# Patient Record
Sex: Female | Born: 1965 | Race: White | Hispanic: No
Health system: Midwestern US, Community
[De-identification: ages and names within clinical notes are randomized; demographics above are authoritative.]

## PROBLEM LIST (undated history)

## (undated) DIAGNOSIS — R109 Unspecified abdominal pain: Secondary | ICD-10-CM

## (undated) DIAGNOSIS — Z0000 Encounter for general adult medical examination without abnormal findings: Secondary | ICD-10-CM

---

## 2010-04-04 LAB — BASIC METABOLIC PANEL WITHOUT CALCIUM
BUN, WHOLE BLOOD: 12 mg/dl (ref 7–22)
CO2, WHOLE BLOOD: 22 meq/l — ABNORMAL LOW (ref 23–33)
CREATININE, WHOLE BLOOD: 0.8 mg/dl (ref 0.5–1.2)
Chloride, Whole Blood: 106 meq/l (ref 98–111)
Glucose, Whole Blood: 89 mg/dl (ref 70–108)
Potassium, Whole Blood: 4 meq/l (ref 3.5–5.2)
Sodium, Whole Blood: 140 meq/l (ref 135–145)

## 2010-04-04 LAB — URINALYSIS WITH MICROSCOPIC
Bilirubin, Urine: NEGATIVE
CASTS 2: NONE SEEN /lpf
Casts UA: NONE SEEN /lpf
Crystals, UA: NONE SEEN
Glucose, Ur: NEGATIVE mg/dl
Ketones, Urine: NEGATIVE
Leukocyte Esterase, Urine: NEGATIVE
MISCELLANEOUS 2: NONE SEEN
Nitrite, Urine: NEGATIVE
Protein, UA: NEGATIVE
Renal Epithelial, UA: NONE SEEN
Specific Gravity, Urine: 1.016 (ref 1.002–1.030)
Urobilinogen, Urine: 0.2 eu/dl (ref 0.0–1.0)
pH, UA: 7 (ref 5.0–9.0)

## 2010-04-04 LAB — CBC
Basophils: 0.5 % (ref 0.0–3.0)
Eosinophils: 1.4 % (ref 0.0–4.0)
Hematocrit: 43.2 % (ref 37.0–47.0)
Hemoglobin: 15 gm/dl (ref 12.0–16.0)
Lymphocytes: 25.4 % (ref 15.0–47.0)
MCH: 33.9 pg — ABNORMAL HIGH (ref 27.0–31.0)
MCHC: 34.7 gm/dl (ref 33.0–37.0)
MCV: 97.7 fL (ref 81.0–99.0)
MPV: 9.4 mcm (ref 7.4–10.4)
Monocytes: 5.9 % (ref 0.0–12.0)
Nucleated Red Blood Cells: 0 /100 wbc
Platelets: 164 10*3/uL (ref 130–400)
RBC Morphology: NORMAL
RBC: 4.42 10*6/uL (ref 4.20–5.40)
RDW: 15 % — ABNORMAL HIGH (ref 11.5–14.5)
Seg Neutrophils: 66.8 % (ref 43.0–75.0)
WBC: 10.6 10*3/uL (ref 4.8–10.8)

## 2010-04-04 LAB — C-REACTIVE PROTEIN: CRP: <0.4 mg/dl (ref 0.00–1.00)

## 2010-04-04 LAB — ANION GAP: Anion Gap: 15.4 (ref 10.0–20.0)

## 2010-04-04 LAB — SEDIMENTATION RATE, AUTOMATED: Sed Rate: 2 mm/hr (ref 0–20)

## 2010-04-04 LAB — OSMOLALITY: Osmolality Calc: 279 (ref 275–300)

## 2010-04-06 LAB — URINALYSIS WITH MICROSCOPIC
Bilirubin Urine: NEGATIVE
Casts: NONE SEEN /lpf
Casts: NONE SEEN /lpf
Crystals: NONE SEEN
Glucose: NEGATIVE mg/dl
Ketones, Urine: NEGATIVE
Leukocyte Esterase, Urine: NEGATIVE
Miscellaneous Lab Test Result: NONE SEEN
Nitrite, Urine: NEGATIVE
Protein, UA: NEGATIVE mg/dl
Renal Epithelial, UA: NONE SEEN
Specific Gravity, UA: 1.026 (ref 1.002–1.030)
Urobilinogen, Urine: 1 eu/dl (ref 0.0–1.0)
pH, UA: 6 (ref 5.0–9.0)

## 2010-04-06 LAB — DRUGS OF ABUSE SCR, URINE
AMPHETAMINE/METHAMPH CONCENTRATION: <1 ng/ml
Amphetamine+Methamphetamine Urine Screen: NEGATIVE
Barbiturates: <1 ng/ml
Barbiturates: NEGATIVE
Benzodiazepine Screen, Urine: 612 ng/ml
Benzodiazepine Screen, Urine: POSITIVE
Cannabinoid: <1 ng/ml
Cannabinoids: NEGATIVE
Cocaine (Metabolite): NEGATIVE
Cocaine: <1 ng/ml
Opiates: 117 ng/ml
Opiates: NEGATIVE
Phencyclidine: <1 ng/ml
Phencyclidine: NEGATIVE

## 2010-04-06 LAB — CBC
Basophils: 0.1 % (ref 0.0–3.0)
Eosinophils: 0.1 % (ref 0.0–4.0)
Hematocrit: 42.5 % (ref 37.0–47.0)
Hemoglobin: 14.4 gm/dl (ref 12.0–16.0)
Lymphocytes: 7.2 % — ABNORMAL LOW (ref 15.0–47.0)
MCH: 33.5 pg — ABNORMAL HIGH (ref 27.0–31.0)
MCHC: 33.8 gm/dl (ref 33.0–37.0)
MCV: 99 fL (ref 81.0–99.0)
MPV: 9.6 mcm (ref 7.4–10.4)
Monocytes: 3.5 % (ref 0.0–12.0)
Nucleated Red Blood Cells: 0 /100 wbc
Platelets: 160 10*3/uL (ref 130–400)
RBC Morphology: NORMAL
RBC: 4.29 10*6/uL (ref 4.20–5.40)
RDW: 15.1 % — ABNORMAL HIGH (ref 11.5–14.5)
Seg Neutrophils: 89.1 % — ABNORMAL HIGH (ref 43.0–75.0)
WBC: 12.7 10*3/uL — ABNORMAL HIGH (ref 4.8–10.8)

## 2010-04-06 LAB — HEPATIC FUNCTION PANEL
ALT: 111 U/L — ABNORMAL HIGH (ref 11–66)
AST: 125 U/L — ABNORMAL HIGH (ref 12–45)
Albumin: 4.3 gm/dl (ref 3.5–5.1)
Alkaline Phosphatase: 137 U/L — ABNORMAL HIGH (ref 38–126)
Bilirubin, Direct: 0.1 mg/dl (ref 0.0–0.3)
Bilirubin, Total: 0.2 mg/dl — ABNORMAL LOW (ref 0.3–1.2)
Total Protein: 6.8 gm/dl (ref 6.1–8.0)

## 2010-04-06 LAB — BASIC METABOLIC PANEL WITHOUT CALCIUM
BUN, WHOLE BLOOD: 20 mg/dl (ref 7–22)
CO2, WHOLE BLOOD: 23 meq/l (ref 23–33)
CREATININE, WHOLE BLOOD: 0.8 mg/dl (ref 0.5–1.2)
Chloride, Whole Blood: 105 meq/l (ref 98–111)
Glucose, Whole Blood: 112 mg/dl — ABNORMAL HIGH (ref 70–108)
Potassium, Whole Blood: 4.3 meq/l (ref 3.5–5.2)
Sodium, Whole Blood: 138 meq/l (ref 135–145)

## 2010-04-06 LAB — HCG, SERUM, QUALITATIVE: Preg, Serum: NEGATIVE

## 2010-04-06 LAB — OSMOLALITY: Osmolality Calc: 279 (ref 275–300)

## 2010-04-06 LAB — TROPONIN: Troponin I: <0.006 ng/ml

## 2010-04-06 LAB — ANA SCREEN WITH REFLEX

## 2010-04-06 LAB — TSH WITH REFLEX: TSH: 0.596 mcIU/ml (ref 0.400–4.200)

## 2010-04-06 LAB — CK-MB
CK-MB: 2.6 ng/ml (ref 0.0–6.5)
Relative Index: 0.68 (ref <3.3)

## 2010-04-06 LAB — ANION GAP: Anion Gap: 14 (ref 10.0–20.0)

## 2010-04-06 LAB — D-DIMER, QUANTITATIVE: D-Dimer, Quant: 169 ng/ml FEU (ref 68.00–494.00)

## 2010-04-06 LAB — CK WITH REFLEX CK-MB: Total CK: 381 U/L — ABNORMAL HIGH (ref 30–135)

## 2010-05-22 LAB — BASIC METABOLIC PANEL WITHOUT CALCIUM
BUN: 11 mg/dl (ref 7–22)
CO2: 26 meq/l (ref 23–33)
Chloride: 110 meq/l (ref 98–111)
Creatinine: 0.7 mg/dl (ref 0.4–1.2)
Glucose: 84 mg/dl (ref 70–108)
Potassium: 3.7 meq/l (ref 3.5–5.2)
Sodium: 140 meq/l (ref 135–145)

## 2010-05-22 LAB — CBC
Basophils: 0.5 % (ref 0.0–3.0)
Eosinophils: 3.8 % (ref 0.0–4.0)
Hematocrit: 44.3 % (ref 37.0–47.0)
Hemoglobin: 14.9 gm/dl (ref 12.0–16.0)
Lymphocytes: 35.3 % (ref 15.0–47.0)
MCH: 33 pg — ABNORMAL HIGH (ref 27.0–31.0)
MCHC: 33.7 gm/dl (ref 33.0–37.0)
MCV: 98 fL (ref 81.0–99.0)
MPV: 8.8 mcm (ref 7.4–10.4)
Monocytes: 5.8 % (ref 0.0–12.0)
Nucleated Red Blood Cells: 0 /100 wbc
Platelets: 232 10*3/uL (ref 130–400)
RBC Morphology: NORMAL
RBC: 4.52 10*6/uL (ref 4.20–5.40)
RDW: 14.1 % (ref 11.5–14.5)
Seg Neutrophils: 54.6 % (ref 43.0–75.0)
WBC: 9.1 10*3/uL (ref 4.8–10.8)

## 2010-05-22 LAB — ACETAMINOPHEN LEVEL: Acetaminophen Level: 4.1 ug/mL (ref 0.0–20.0)

## 2010-05-22 LAB — HCG, SERUM, QUALITATIVE: Preg, Serum: NEGATIVE

## 2010-05-22 LAB — ANION GAP: Anion Gap: 7.7 — ABNORMAL LOW (ref 10.0–20.0)

## 2010-05-22 LAB — OSMOLALITY: Osmolality Calc: 278 mOsmol/kg (ref 275.0–300.0)

## 2010-05-22 LAB — SALICYLATE LEVEL: Salicylate, Serum: <1 mg/dl — ABNORMAL LOW (ref 2.0–10.0)

## 2010-05-22 LAB — GLOMERULAR FILTRATION RATE, ESTIMATED: Est, Glom Filt Rate: >90 mL/min/{1.73_m2}

## 2010-05-22 LAB — ETHANOL: ETHYL ALCOHOL, SERUM: <0.01 % (gm/dl)

## 2010-08-10 LAB — BASIC METABOLIC PANEL WITHOUT CALCIUM
BUN, WHOLE BLOOD: 15 mg/dl (ref 7–22)
CO2, WHOLE BLOOD: 30 meq/l (ref 23–33)
CREATININE, WHOLE BLOOD: 0.9 mg/dl (ref 0.5–1.2)
Chloride, Whole Blood: 104 meq/l (ref 98–111)
Glucose, Whole Blood: 110 mg/dl — ABNORMAL HIGH (ref 70–108)
Potassium, Whole Blood: 4.1 meq/l (ref 3.5–5.2)
Sodium, Whole Blood: 141 meq/l (ref 135–145)

## 2010-08-10 LAB — CALCIUM: Calcium: 9.1 mg/dl (ref 8.5–10.5)

## 2010-08-10 LAB — HEPATIC FUNCTION PANEL
ALT: 19 U/L (ref 11–66)
AST: 17 U/L (ref 12–45)
Albumin: 4.4 gm/dl (ref 3.5–5.1)
Alkaline Phosphatase: 122 U/L (ref 38–126)
Bilirubin, Direct: 0.1 mg/dl (ref 0.0–0.3)
Bilirubin, Total: 0.2 mg/dl — ABNORMAL LOW (ref 0.3–1.2)
Total Protein: 7.1 gm/dl (ref 6.1–8.0)

## 2010-08-10 LAB — CBC
Basophils: 0.4 % (ref 0.0–3.0)
Eosinophils: 2.1 % (ref 0.0–4.0)
Hematocrit: 41.8 % (ref 37.0–47.0)
Hemoglobin: 14.9 gm/dl (ref 12.0–16.0)
Lymphocytes: 24.5 % (ref 15.0–47.0)
MCH: 32.9 pg — ABNORMAL HIGH (ref 27.0–31.0)
MCHC: 35.7 gm/dl (ref 33.0–37.0)
MCV: 92.1 fL (ref 81.0–99.0)
MPV: 9.4 mcm (ref 7.4–10.4)
Monocytes: 5.9 % (ref 0.0–12.0)
Platelets: 221 10*3/uL (ref 130–400)
RBC Morphology: NORMAL
RBC: 4.54 10*6/uL (ref 4.20–5.40)
RDW: 13.5 % (ref 11.5–14.5)
Seg Neutrophils: 67.1 % (ref 43.0–75.0)
WBC: 10.1 10*3/uL (ref 4.8–10.8)

## 2010-08-10 LAB — HCG, SERUM, QUALITATIVE: Preg, Serum: NEGATIVE

## 2010-08-10 LAB — PROLACTIN: Prolactin: 8.8 ng/ml

## 2010-08-10 LAB — MAGNESIUM: Magnesium: 2.2 mg/dl (ref 1.6–2.4)

## 2010-08-10 LAB — ANION GAP: Anion Gap: 17 (ref 10.0–20.0)

## 2010-08-10 LAB — OSMOLALITY: Osmolality Calc: 283 (ref 275–300)

## 2010-08-12 ENCOUNTER — Observation Stay: Admit: 2010-08-12 | Disposition: A | Discharge status: Other Acute Inpatient Hospital | Admitting: Internal Medicine

## 2010-08-12 LAB — POCT GLUCOSE
POC Glucose: 132 mg/dl — ABNORMAL HIGH (ref 70–108)
POC Glucose: 105 mg/dl (ref 70–108)

## 2010-08-12 LAB — TSH: TSH: 1.647 mcIU/ml (ref 0.400–4.200)

## 2010-08-12 LAB — CBC
Basophils: 0.4 % (ref 0.0–3.0)
Eosinophils: 4.1 % — ABNORMAL HIGH (ref 0.0–4.0)
Hematocrit: 42.6 % (ref 37.0–47.0)
Hemoglobin: 14.3 gm/dl (ref 12.0–16.0)
Lymphocytes: 37.1 % (ref 15.0–47.0)
MCH: 32.7 pg — ABNORMAL HIGH (ref 27.0–31.0)
MCHC: 33.4 gm/dl (ref 33.0–37.0)
MCV: 97.8 fL (ref 81.0–99.0)
MPV: 9.1 mcm (ref 7.4–10.4)
Monocytes: 8.3 % (ref 0.0–12.0)
Nucleated Red Blood Cells: 0 /100 wbc
Platelets: 210 10*3/uL (ref 130–400)
RBC Morphology: NORMAL
RBC: 4.36 10*6/uL (ref 4.20–5.40)
RDW: 13.8 % (ref 11.5–14.5)
Seg Neutrophils: 50.1 % (ref 43.0–75.0)
WBC: 9.4 10*3/uL (ref 4.8–10.8)

## 2010-08-12 LAB — PHOSPHORUS: Phosphorus: 5.3 mg/dl — ABNORMAL HIGH (ref 2.4–4.7)

## 2010-08-12 LAB — BASIC METABOLIC PANEL WITHOUT CALCIUM
BUN, WHOLE BLOOD: 21 mg/dl (ref 7–22)
CO2, WHOLE BLOOD: 25 meq/l (ref 23–33)
CREATININE, WHOLE BLOOD: 1.1 mg/dl (ref 0.5–1.2)
Chloride, Whole Blood: 107 meq/l (ref 98–111)
Glucose, Whole Blood: 117 mg/dl — ABNORMAL HIGH (ref 70–108)
Potassium, Whole Blood: 4.8 meq/l (ref 3.5–5.2)
Sodium, Whole Blood: 135 meq/l (ref 135–145)

## 2010-08-12 LAB — PROLACTIN: Prolactin: 31.1 ng/ml

## 2010-08-12 LAB — ANION GAP: Anion Gap: 11.2 (ref 10.0–20.0)

## 2010-08-12 LAB — MAGNESIUM: Magnesium: 2.4 mg/dl (ref 1.6–2.4)

## 2010-08-12 LAB — OSMOLALITY: Osmolality Calc: 275 (ref 275–300)

## 2010-08-12 LAB — CALCIUM: Calcium: 9.1 mg/dl (ref 8.5–10.5)

## 2011-01-08 LAB — HEPATIC FUNCTION PANEL
ALT: 30 U/L (ref 11–66)
AST: 18 U/L (ref 12–45)
Albumin: 4.5 gm/dl (ref 3.5–5.1)
Alkaline Phosphatase: 133 U/L — ABNORMAL HIGH (ref 38–126)
Bilirubin, Direct: 0.1 mg/dl (ref 0.0–0.3)
Bilirubin, Total: 0.3 mg/dl (ref 0.3–1.2)
Total Protein: 7 gm/dl (ref 6.1–8.0)

## 2011-01-08 LAB — BASIC METABOLIC PANEL WITHOUT CALCIUM
BUN, WHOLE BLOOD: 10 mg/dl (ref 7–22)
CO2, WHOLE BLOOD: 22 meq/l — ABNORMAL LOW (ref 23–33)
CREATININE, WHOLE BLOOD: 0.7 mg/dl (ref 0.5–1.2)
Chloride, Whole Blood: 107 meq/l (ref 98–111)
Glucose, Whole Blood: 101 mg/dl (ref 70–108)
Potassium, Whole Blood: 5.5 meq/l — ABNORMAL HIGH (ref 3.5–5.2)
Sodium, Whole Blood: 137 meq/l (ref 135–145)

## 2011-01-08 LAB — LIPASE: Lipase: 20 U/L (ref 5.6–51.3)

## 2011-01-08 LAB — CBC WITH AUTO DIFFERENTIAL
Basophils: 0.4 % (ref 0.0–3.0)
Eosinophils: 1.7 % (ref 0.0–4.0)
Hematocrit: 41.6 % (ref 37.0–47.0)
Hemoglobin: 13.9 gm/dl (ref 12.0–16.0)
Lymphocytes: 38.7 % (ref 15.0–47.0)
MCH: 31.6 pg — ABNORMAL HIGH (ref 27.0–31.0)
MCHC: 33.3 gm/dl (ref 33.0–37.0)
MCV: 94.8 fL (ref 81.0–99.0)
MPV: 8.8 mcm (ref 7.4–10.4)
Monocytes: 6.2 % (ref 0.0–12.0)
Nucleated Red Blood Cells: 0 /100 wbc
Platelets: 218 10*3/uL (ref 130–400)
RBC Morphology: NORMAL
RBC: 4.39 10*6/uL (ref 4.20–5.40)
RDW: 15.5 % — ABNORMAL HIGH (ref 11.5–14.5)
Seg Neutrophils: 53 % (ref 43.0–75.0)
WBC: 8.7 10*3/uL (ref 4.8–10.8)

## 2011-01-08 LAB — PHENYTOIN LEVEL, TOTAL: Phenytoin Lvl: 3.6 ug/mL — ABNORMAL LOW (ref 6.0–18.0)

## 2011-01-08 LAB — ANION GAP: Anion Gap: 16.5 (ref 10.0–20.0)

## 2011-01-08 LAB — OSMOLALITY: Osmolality Calc: 274 — ABNORMAL LOW (ref 275–300)

## 2011-01-11 LAB — POCT BLOOD GASES
Base Excess (Calculated): 1 mmol/l (ref <=2.5)
COLLECTED BY:: 18519
HCO3: 26 mmol/l (ref 23–28)
LITERS PER MINUTE: 2
O2 Sat: 98 % — ABNORMAL HIGH (ref 94–97)
PCO2: 42 mmhg (ref 35–45)
pH: 7.4 (ref 7.35–7.45)
pO2: 109 mmhg — ABNORMAL HIGH (ref 71–104)

## 2011-01-11 LAB — DRUGS OF ABUSE SCR, URINE
AMPHETAMINE/METHAMPH CONCENTRATION: 45 ng/ml
Amphetamine+Methamphetamine Urine Screen: NEGATIVE
Barbiturates: 68 ng/ml
Barbiturates: NEGATIVE
Benzodiazepine Screen, Urine: 700 ng/ml
Benzodiazepine Screen, Urine: POSITIVE
Cannabinoid: <1 ng/ml
Cannabinoids: NEGATIVE
Cocaine (Metabolite): NEGATIVE
Cocaine: <1 ng/ml
Opiates: 44 ng/ml
Opiates: NEGATIVE
Phencyclidine: 8 ng/ml
Phencyclidine: NEGATIVE

## 2011-01-11 LAB — URINALYSIS WITH MICROSCOPIC
Bilirubin, Urine: NEGATIVE
CASTS 2: NONE SEEN /lpf
Casts UA: NONE SEEN /lpf
Crystals, UA: NONE SEEN
Glucose, Ur: NEGATIVE mg/dl
Ketones, Urine: NEGATIVE
Leukocyte Esterase, Urine: NEGATIVE
MISCELLANEOUS 2: NONE SEEN
Nitrite, Urine: NEGATIVE
Protein, UA: NEGATIVE
Renal Epithelial, UA: NONE SEEN
Specific Gravity, Urine: 1.023 (ref 1.002–1.030)
Urobilinogen, Urine: 0.2 eu/dl (ref 0.0–1.0)
Yeast, UA: NONE SEEN
pH, UA: 6 (ref 5.0–9.0)

## 2011-01-11 LAB — CBC WITH AUTO DIFFERENTIAL
Basophils: 0.6 % (ref 0.0–3.0)
Eosinophils: 2.6 % (ref 0.0–4.0)
Hematocrit: 43.8 % (ref 37.0–47.0)
Hemoglobin: 14.9 gm/dl (ref 12.0–16.0)
Lymphocytes: 44.5 % (ref 15.0–47.0)
MCH: 31.5 pg — ABNORMAL HIGH (ref 27.0–31.0)
MCHC: 34.1 gm/dl (ref 33.0–37.0)
MCV: 92.4 fL (ref 81.0–99.0)
MPV: 9.3 mcm (ref 7.4–10.4)
Monocytes: 6.9 % (ref 0.0–12.0)
Nucleated Red Blood Cells: 0 /100 wbc
Platelets: 223 10*3/uL (ref 130–400)
RBC Morphology: NORMAL
RBC: 4.74 10*6/uL (ref 4.20–5.40)
RDW: 15.3 % — ABNORMAL HIGH (ref 11.5–14.5)
Seg Neutrophils: 45.4 % (ref 43.0–75.0)
WBC: 9.2 10*3/uL (ref 4.8–10.8)

## 2011-01-11 LAB — OSMOLALITY: Osmolality Calc: 283 (ref 275–300)

## 2011-01-11 LAB — BASIC METABOLIC PANEL WITHOUT CALCIUM
BUN, WHOLE BLOOD: 10 mg/dl (ref 7–22)
CO2, WHOLE BLOOD: 29 meq/l (ref 23–33)
CREATININE, WHOLE BLOOD: 0.7 mg/dl (ref 0.5–1.2)
Chloride, Whole Blood: 107 meq/l (ref 98–111)
Glucose, Whole Blood: 116 mg/dl — ABNORMAL HIGH (ref 70–108)
Potassium, Whole Blood: 4.4 meq/l (ref 3.5–5.2)
Sodium, Whole Blood: 142 meq/l (ref 135–145)

## 2011-01-11 LAB — ETHANOL: ETHYL ALCOHOL, SERUM: <0.01 % (gm/dl)

## 2011-01-11 LAB — ANION GAP: Anion Gap: 14.2 (ref 10.0–20.0)

## 2011-01-11 LAB — TROPONIN: Troponin I: <0.006 ng/ml

## 2011-01-11 LAB — PHENYTOIN LEVEL, TOTAL: Phenytoin Lvl: 3 ug/mL — ABNORMAL LOW (ref 6.0–18.0)

## 2011-01-11 LAB — CK WITH REFLEX CK-MB: Total CK: 82 U/L (ref 30–135)

## 2011-01-18 LAB — BASIC METABOLIC PANEL WITHOUT CALCIUM
BUN: 14 mg/dl (ref 7–22)
CO2: 30 meq/l (ref 23–33)
Chloride: 109 meq/l (ref 98–111)
Creatinine: 0.8 mg/dl (ref 0.4–1.2)
Glucose: 108 mg/dl (ref 70–108)
Potassium: 3.9 meq/l (ref 3.5–5.2)
Sodium: 142 meq/l (ref 135–145)

## 2011-01-18 LAB — CBC WITH AUTO DIFFERENTIAL
Basophils: 0.9 % (ref 0.0–3.0)
Eosinophils: 2.3 % (ref 0.0–4.0)
Hematocrit: 41 % (ref 37.0–47.0)
Hemoglobin: 13.9 gm/dl (ref 12.0–16.0)
Lymphocytes: 41.8 % (ref 15.0–47.0)
MCH: 31.5 pg — ABNORMAL HIGH (ref 27.0–31.0)
MCHC: 34 gm/dl (ref 33.0–37.0)
MCV: 92.9 fL (ref 81.0–99.0)
MPV: 9.8 mcm (ref 7.4–10.4)
Monocytes: 7.2 % (ref 0.0–12.0)
Nucleated Red Blood Cells: 0 /100 wbc
Platelet Estimate: ADEQUATE
Platelets: 199 10*3/uL (ref 130–400)
RBC Morphology: ABNORMAL
RBC: 4.42 10*6/uL (ref 4.20–5.40)
RDW: 14.5 % (ref 11.5–14.5)
Seg Neutrophils: 47.8 % (ref 43.0–75.0)
WBC: 8.2 10*3/uL (ref 4.8–10.8)

## 2011-01-18 LAB — HCG, SERUM, QUALITATIVE: Preg, Serum: NEGATIVE

## 2011-01-18 LAB — HEPATIC FUNCTION PANEL
ALT: 24 U/L (ref 11–66)
AST: 21 U/L (ref 12–45)
Albumin: 4.3 gm/dl (ref 3.5–5.1)
Alkaline Phosphatase: 132 U/L — ABNORMAL HIGH (ref 38–126)
Bilirubin, Direct: 0 mg/dl (ref 0.0–0.3)
Bilirubin, Total: 0.2 mg/dl — ABNORMAL LOW (ref 0.3–1.2)
Total Protein: 6.8 gm/dl (ref 6.1–8.0)

## 2011-01-18 LAB — ANION GAP: Anion Gap: 6.9 — ABNORMAL LOW (ref 10.0–20.0)

## 2011-01-18 LAB — OSMOLALITY: Osmolality Calc: 284.1 mOsmol/kg (ref 275.0–300.0)

## 2011-01-18 LAB — PHENYTOIN LEVEL, TOTAL: Phenytoin Lvl: 3.3 ug/mL — ABNORMAL LOW (ref 6.0–18.0)

## 2011-01-18 LAB — LIPASE: Lipase: 26 U/L (ref 5.6–51.3)

## 2011-01-18 LAB — GLOMERULAR FILTRATION RATE, ESTIMATED: Est, Glom Filt Rate: 77 mL/min/{1.73_m2} — AB

## 2011-01-18 LAB — SCAN OF BLOOD SMEAR

## 2011-01-19 ENCOUNTER — Inpatient Hospital Stay: Admit: 2011-01-19 | Disposition: A | Discharge status: Home / Self Care | Admitting: Anesthesiology

## 2011-01-19 LAB — PHENYTOIN LEVEL, TOTAL: Phenytoin Lvl: 11.6 ug/mL (ref 6.0–18.0)

## 2011-01-20 NOTE — Procedures (Unsigned)
[facility]                         ELECTROENCEPHALOGRAPHY LAB (EEG)                              [address]                                 [place], [place] [address]                                  [phone]                                       EEG REPORT     PATIENT NAME: Theresa Hobbs, Theresa Hobbs.  DATE: 01/20/2011  MEDICAL RECORD NO: [id number]  ROOM NO: 4A 0006  ACCOUNT NO: [id number]  REFERRING DOCTOR: Lenon Ahmadi, M.D.        HISTORY:  A 45 year old female who had a seizure on 01/19/2011.  He had  stiffness, brief unresponsiveness, staring, headaches.  Has history of MS,  lupus, pseudoseizures, depression.  On Dilantin, Keppra, Valium, Lovenox,  Neurontin, Dilaudid, Phenergan, Protonix, prednisone.     REPORT:  A 16-channel digital EEG, data recorded using the 10/20 system.  Channels also dedicated to EKG and ocular movement.  The background occipital  alpha activity is 8-10 Hz in frequency and 40-60 mcV in amplitude.  Organization is fair.  Superimposed muscle artifact as well as beta activity  throughout the recording seen.  There is normal attenuation to eye opening.  The patient because drowsy but did not enter into Stage II of sleep.  There  was minimal driving to photic stimulation.  No epileptiform discharged  identified.     IMPRESSION:  This is a borderline normal EEG awake and drowsy.  No seizure  activity identified.  Medication effect reflected as beta activity seen.           Jilian West A. Weyman Rodney, M.D.        D: 01/20/2011 08:55                                    T: 01/20/2011 10:08  ks     CC:  Lenon Ahmadi, M.D.               Darneisha Windhorst A. Izick Gasbarro, M.D.  [address]                      [facility] [address]                             [address]  [place], [place] [address]                        [place], [place] [address]

## 2011-01-20 NOTE — Consults (Unsigned)
[facility]                                    [place], [place]                                      CONSULTATION     TO:                                     RE: Theresa, Hobbs                                          MED REC NO:         ROOM: 4A 0006                                          [id number]  FROM: Gershon Cull. Shannia Jacuinde, M.D.             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  She has a long history of pseudoseizures as well as migraine  headaches.  She takes Dilantin and Keppra.  She was vomiting recently and did  not take her medications so she presented with seizure like activities,  starring, spaciness, poor responsiveness.  She had several attacks and on her  way to the ER they describe what they call as generalized seizure.  Her  phenytoin level was 3.3.  She was loaded with Dilantin and admitted.  She had  previous encounters with Dr. Valentina Shaggy and Dr. Delphina Cahill in neurology and she  does not want to see them both, so I elected to see the patient.  CBC within  normal limits.  Chemistry profile normal.  Bilirubin normal.  She had an EEG  that I reviewed, it is within normal limits.  Blood pressure within normal.  Previous CT within normal limits.     REVIEW OF SYSTEMS:  Remarkable for seizure like activity, dizziness,  headaches, depression.     PAST MEDICAL HISTORY:  Remarkable for presumed MS, lupus, renal cancer with  radiation, EGD, peptic ulcer disease, pseudoseizures, drug seeking behavior,  suicidal ideation, depression.     PAST SURGICAL HISTORY:  Cholecystectomy, nasal polyp removal.     CURRENT MEDICATIONS:  Dilantin 100 mg p.o. three times a day, Keppra 500  twice a day, Percocet, Fioricet, Valium 5 mg four times a day,  Lidoderm,  Protonix, prednisone 20 mg daily, Phenergan, Carafate, Restoril 30 mg at  night.     ALLERGIES:  TORADOL, SULFA,  PENICILLIN, NUBAIN, LEVAQUIN, KLONOPIN, FLAGYL,  ERYTHROMYCIN, CIPRO, CEFTIN, BACTRIM, AUGMENTIN.     FAMILY HISTORY:  Noncontributory.     PHYSICAL EXAMINATION:  The patient was dazed and in and out of conversation.  She would stare and then respond but these were not typical for complex  partial seizure.  It was more of an odd behavior.  Pupils 4 mm, reactive to  light.  Visual fields full.  No facial asymmetry or weakness, no facial  sensory loss.  Tongue protruded symmetrically in the midline.  Palate  elevated symmetrically in the midline.  She did not cooperative sometimes  with motor evaluation.  Reflexes 1/2 throughout.  No Babinski sign, no  clonus, no spasticity.  GAIT:  Not tested.  HEART:  Regular rate and rhythm.     IMPRESSION:  Theresa Hobbs presents with recurrent seizure like activity with normal EEG  and odd behavior consistent, mimicking complex partial seizures.  The patient  has pseudoseizures, these are obviously nonepileptic.  We will leave her on  the same medications for now.  We will do a stat EEG while she is having this  odd behavior just to make sure there is no organic etiology for her symptoms.  Otherwise neurologically stable.        Melainie Krinsky A. Jobie Popp, M.D.        D: 01/20/2011 12:39                                    T: 01/20/2011 14:56  ds

## 2011-01-21 NOTE — Discharge Summary (Unsigned)
[facility]                                    [place], [place]                                    CLINICAL SUMMARY     PATIENT NAME: Theresa Hobbs, Theresa Hobbs           DOB:            01/11/1966  MEDICAL RECORD NO: [id number]            ADMISSION DATE: 01/19/2011  ACCOUNT NO: [id number]                     DISCHARGE DATE: 01/21/2011  PHYSICIAN: Marcellina Millin, M.D.        HOSPITAL COURSE:  A 45 year old white American female with a past medical  history of pseudoseizure, as well as migraine headache.  The patient is  taking Dilantin and Keppra to control her seizures.  Per patient report, she  had a recurrent episode of migraine associated with nausea and vomiting.  The  patient was not able to tolerate her seizure medication.  She presented to  the hospital with seizure-like activity, staring, spaciness, vocal  responsiveness.  The patient reports she had different attack on her way to  the Emergency Room.  Per the patient's description has generalized seizure.  The patient also on admission she was complaining of recurrent episodes of  diarrhea, nausea and vomiting.  The patient stated that she noticed some dark  stool.  On further questioning of the patient, she stated that maybe once a  month she admits to some dark stool.  She had a regular followup with her  gastroenterologist, Dr. Jarold Motto.  She had EGD done last month with findings  of small nonbleeding gastric ulcer per patient report.  The patient stated  that she is talking Protonix regularly.  At the beginning on admission, there  was a report that the patient noticed some fresh blood, but on further  questioning the patient, she stated she had black stool and usually she has  this hematemesis like almost once a month.  We admitted the patient to the  hospital for further evaluation of her condition.     Workup during hospitalization, phenytoin level was 3.3, hemoglobin was 13.9,  hematocrit was 41.0, and WBC was 8.2, platelets  199,000.  Sodium 142,  potassium 3.9, chloride 109, bicarbonate 30, glucose 108, BUN 14, creatinine  0.8.  Total protein was 6.8.  Anion gap was 6.9.  Albumin was 4.3, AST was  21, ALT was 24, alkaline phosphatase was 132, total bilirubin was 0.2, direct  bilirubin was 0.0, lipase was 26.  Pregnancy test was negative.  We admitted  the patient to the hospital for further evaluation.        We started the patient on IV Protonix, in addition added Zofran as needed for  nausea.  Pain control started with Dilaudid.  EEG was ordered and was done  and showed borderline normal EEG, awake and drowsy.  No seizure activity  identified.  Medication effect reflected as beta activity is seen.  We  consulted Dr. Ronna Polio who came and evaluated the patient.  He stated  that the patient had pseudoseizure.  With  her negative EEG, his clinical  evaluation for the patient is stated that this is obviously not epileptic.  He states to continue the same medication.  During the hospital stay, the  patient does not have any witnessed active seizure.  Dr. Weyman Rodney recommended  to have MRI to be done.  The patient refused MRI to be done.  The patient was  complaining of neck pain and shoulder pain status post fall.  X-ray ordered,  but the patient refused to have x-ray to be done for the neck and lumbar  spine, but x-ray for the shoulder was done which showed no acute fracture or  dislocation.  We started the patient on Fioricet for her migraine headache.  I had a long discussion with the patient about her peptic ulcer disease and  her compliance with medication.  The report on admission stated noncompliance  to medication, but on further questioning, the patient stated she is taking  her medications regularly.  I counseled her about taking her medications  regularly.  Need to have reevaluation by GI in 1 week or earlier if she has  any further melena in stool.  I discussed with the patient about the need to  follow up with neurologist in  1-2 weeks for monitoring her symptoms.  Dr.  Weyman Rodney is okay to discharge the patient home.  On the day of discharge, the  patient denies any nausea or vomiting, denies any chest pain.  No trouble  breathing.  No seizure.  No motor or sensory changes.  She has some mild  headache, relieved after taking Percocet.  The patient denies any abdominal  pain, denies any leg pain.     PHYSICAL EXAMINATION:  GENERAL:  Alert, oriented in no distress.  CHEST:  Clear to auscultation bilaterally.  HEART:  S1-S2 normal, regular rate and  rhythm.  No  murmur, no rub, no gallop.  ABDOMEN:  Soft, nontender,  nondistended.  EXTREMITIES:  No edema.  Motor 5/5 in all extremities.  Sensory intact.  Cranial nerves II-XII were within normal limits.     MEDICATIONS ON DISCHARGE:  Neurontin 200 mg b.i.d., diazepam 1 tablet q.6h.,  Effexor 150 mg once daily, pantoprazole 40 mg once daily, temazepam 2 caps at  bedtime, prednisone 20 mg once daily, phenytoin 300 mg t.i.d., Keppra 500 mg  t.i.d., Fioricet 1 tablet q.4h. as needed, Carafate 1 g t.i.d. x5 days before  meals, Flexeril 5 mg q.6h. as needed, calcium with vitamin D 600/400 mg oral  b.i.d., vitamin D 1000 units once daily, acetaminophen hydrocodone 5/500  q.8h. p.r.n., #20, no refill, Phenergan 12.5 mg q.8h. as needed.     Follow up with neurologist in 1 week.  Follow up with gastroenterologist in 1  week.  Follow up with family physician in 1 week.     I had a long discussion with the patient, counseling about narcotics and  about side effects.     DISCHARGE DIAGNOSIS:       1.    Pseudoseizure.       2.    Low back pain.       3.    Major depression.       4.    Gastritis.           Marcellina Millin, M.D.        D: 01/21/2011 16:56  T: 01/23/2011 12:08  als     CC:  Marcellina Millin, M.D.            Hospitalist Program  [address]  [address]  [place], [place] [address]

## 2011-01-23 LAB — CBC WITH AUTO DIFFERENTIAL
Basophils: 0.4 % (ref 0.0–3.0)
Eosinophils: 3.4 % (ref 0.0–4.0)
Hematocrit: 41.2 % (ref 37.0–47.0)
Hemoglobin: 13.8 gm/dl (ref 12.0–16.0)
Lymphocytes: 49 % — ABNORMAL HIGH (ref 15.0–47.0)
MCH: 31.4 pg — ABNORMAL HIGH (ref 27.0–31.0)
MCHC: 33.4 gm/dl (ref 33.0–37.0)
MCV: 93.8 fL (ref 81.0–99.0)
MPV: 8.9 mcm (ref 7.4–10.4)
Monocytes: 6.6 % (ref 0.0–12.0)
Nucleated Red Blood Cells: 0 /100 wbc
Platelets: 204 10*3/uL (ref 130–400)
RBC Morphology: NORMAL
RBC: 4.4 10*6/uL (ref 4.20–5.40)
RDW: 15.3 % — ABNORMAL HIGH (ref 11.5–14.5)
Seg Neutrophils: 40.6 % — ABNORMAL LOW (ref 43.0–75.0)
WBC: 7.3 10*3/uL (ref 4.8–10.8)

## 2011-01-23 LAB — HEPATIC FUNCTION PANEL
ALT: 27 U/L (ref 11–66)
AST: 18 U/L (ref 12–45)
Albumin: 4.1 gm/dl (ref 3.5–5.1)
Alkaline Phosphatase: 133 U/L — ABNORMAL HIGH (ref 38–126)
Bilirubin, Direct: 0 mg/dl (ref 0.0–0.3)
Bilirubin, Total: 0.1 mg/dl — ABNORMAL LOW (ref 0.3–1.2)
Total Protein: 6.3 gm/dl (ref 6.1–8.0)

## 2011-01-23 LAB — BASIC METABOLIC PANEL WITHOUT CALCIUM
BUN: 15 mg/dl (ref 7–22)
CO2: 28 meq/l (ref 23–33)
Chloride: 110 meq/l (ref 98–111)
Creatinine: 0.8 mg/dl (ref 0.4–1.2)
Glucose: 146 mg/dl — ABNORMAL HIGH (ref 70–108)
Potassium: 4.3 meq/l (ref 3.5–5.2)
Sodium: 142 meq/l (ref 135–145)

## 2011-01-23 LAB — GLOMERULAR FILTRATION RATE, ESTIMATED: Est, Glom Filt Rate: 77 mL/min/{1.73_m2} — AB

## 2011-01-23 LAB — OSMOLALITY: Osmolality Calc: 286.6 mOsmol/kg (ref 275.0–300.0)

## 2011-01-23 LAB — PHENYTOIN LEVEL, TOTAL: Phenytoin Lvl: 1.5 ug/mL — ABNORMAL LOW (ref 6.0–18.0)

## 2011-01-23 LAB — HCG, SERUM, QUALITATIVE: Preg, Serum: NEGATIVE

## 2011-01-23 LAB — ANION GAP: Anion Gap: 8.3 — ABNORMAL LOW (ref 10.0–20.0)

## 2011-01-23 NOTE — ED Provider Notes (Unsigned)
[facility]                                    [place], [facility]                        EMERGENCY DEPARTMENT PHYSICIAN DICTATION     PATIENT NAME: Theresa Hobbs, Theresa Hobbs                DOB:  [birth year]  MEDICAL RECORD NO: [id number]                 ROOM: ED  ACCOUNT NO: [id number]                          DATE: 01/22/2011  PHYSICIAN: Quintella Reichert, M.D.     This dictation is an adjunct to the Emergency Department Physician  Documentation Form.     CHIEF COMPLAINT:  45 year old white female brought in here because of  seizures today.     HISTORY OF PRESENT ILLNESS:  The patient is a 45 year old white female who  came in here, walking in. The patient states that she was discharged  yesterday from the hospital after being hospitalized from 01/19/2011 to  01/21/2011. The patient was then admitted to the Hospitalist Service and also  with Dr. Weyman Rodney, the neurologist.  During the hospitalization, the patient  had been complaining of seizures which was characterized as shaking of the  head and the patient stated that she cannot remember or recollect about any  loss of consciousness.  The patient had an EEG during the hospital stay that  was normal and was told that the patient has pseudoseizures.     I talked briefly also with Dr. Weyman Rodney tonight and he indicated that the  patient is wanting mostly narcotics to control her headache and most  medications never work for her in the hospital.  The patient states when she  went home, she was doing well.  However, today upon waking up, she feels  nauseous and vomited four times. She states that she had seizures twice  characterized as shaking of the head left and right without any remembering  things.  However, she did not have any loss of consciousness.  The patient  also has been complaining of a headache and also abdominal pain. She had some  ulcers. She states she vomited four times today. The patient admits that she  is taking all of her  medications.     REVIEW OF SYSTEMS:  Including nurses' notes were reviewed.  She did not have  any fever or chills. No double vision or blurred vision.  No sore throat,  cough, or cold.  No melena or hematochezia.     PERTINENT PHYSICAL EXAM FINDINGS:  VITAL SIGNS:  Temperature 96.1,  respirations 26, pulse 100, blood pressure 90/45, saturation 99%.  GENERAL  APPEARANCE:  Well developed, alert, active and cooperative.  The patient is  not ill looking and conversant. The patient is more argumentative rather than  in pain. The patient has been complaining of being nauseous.  HEAD:  Atraumatic.  EYES:  Pupils are equal, round, and reactive to light.  Extraocular muscles are intact.  EARS:  Tympanic membranes are normal. NOSE:  No nasal discharge.  No tenderness in the sinuses. ORAL MUCOSA:  No  tonsillopharyngeal congestion.  NECK:  Supple. LUNGS:  Good air exchange.  No  rales or wheezes.  HEART:  Regular rate and rhythm. ABDOMEN:  Globular, soft,  depressible.  There is tenderness on the left upper quadrant.  There is no  rebound tenderness.  There is no Obturator or Psoas sign.  No McBurney's  point.  No Murphy's sign.  NEURO EXAMINATION:  Motor, sensory, and cranial  nerves are all intact.  BACK: Showed no CVA tenderness.     MEDICAL DECISION MAKING:       1.    Headache.       2.    Abdominal pain.       3.    History of seizures, however seems like the patient has             pseudoseizures.     DIAGNOSTIC DATA ORDERED:  CBC showed a WBC of 7.3, hemoglobin 13.8,  hematocrit 41, platelet 204. Differential count:  Segs 40.6, lymphocytes 49.  BMP is within normal limits.  The patient's liver function tests are likewise  normal.  Pregnancy test is negative.  Phenytoin level is 1.5.     EMERGENCY DEPARTMENT COURSE:  The patient was examined. The patient is more  argumentative here rather than having active real pain. The patient is having  one supposedly seizure that was I witnessed in front while out of bed.   The  patient's seizures are not true seizures.  She had head shaking with rocking  her head back and forth without any eyes rolling back or any loss of  consciousness nor shaking of the arms and legs. The patient did not have any  loss of consciousness at this time.  I believe the patient's seizure is more  like a pseudoseizure. The patient was given Ativan initially in the triage  area. When I saw the patient, the patient was given 1 mg of Dilaudid followed  by another milligram. She was given also Zofran ODT. The patient's claims  that the Zofran is working better than the Phenergan that she has at home.  The patient's case was discussed with Dr. Weyman Rodney who had known the patient  during the previous hospitalization and indicated that he would like to see  the patient in his office or she needs to follow with [facility]. I  explained to the patient that she needs to see only one physician and one  neurologist so they can monitor rather than jumping from one physician to  another which the patient acknowledged and sent home in stable condition.     CRITICAL CARE:  None.     CONSULTATIONS:  Dr. Weyman Rodney.     PROCEDURES:  None.     DIAGNOSES:       1.    Headache.       2.    Abdominal pain, improved.       3.    Seizures versus pseudoseizures.     PLAN:  DISPOSITION/TREATMENT/INSTRUCTIONS/FOLLOWUP:       1.    Zofran 4 mg #10.       2.    Vicodin #15.       3.    Follow up at [facility] or family doctor this week.       4.    The patient is continue medication at home.       5.    Return if anymore signs or symptoms.     A face to face evaluation and direct participation in all aspects of patient  care was performed by the emergency physician signing this dictation.        Quintella Reichert, M.D.     D: 01/23/2011 01:41                                    T: 01/26/2011 14:04  cd

## 2011-03-31 NOTE — ED Notes (Signed)
Family brought family to ER for altered mental status ongoing "all week". States she was "only orient to her name". Currently undergoing chemo treatment for kidney cancer. Hx of seizures. Pt on way to ER and began seizure like activity in car. Pt to ED room 5 by wheelchair for seizure like activity. Pt nonresponsive to ammonia inhalant. Pt with ability to sit up in bed without comprehensible speech. Seizure pads placed. Pt with voluntary arm movements. Family with patient. Monitor.    Harlon Flor, RN  03/31/11 2219

## 2011-03-31 NOTE — ED Notes (Signed)
Pt goes in and out of speaking jibberish and english, then pt began speaking what family states is another language.    Nanetta Batty, RN  03/31/11 [phone]

## 2011-03-31 NOTE — ED Notes (Signed)
After speaking with jeremy pt is now a&o speaking with family    Shanisha Lech, RN  03/31/11 2357

## 2011-03-31 NOTE — ED Notes (Signed)
Pt moved to room 4. Pt aggitated and yelling out.    Nanetta Batty, RN  03/31/11 2232

## 2011-03-31 NOTE — ED Provider Notes (Signed)
[facility]  eMERGENCY dEPARTMENT eNCOUnter          CHIEF COMPLAINT       Chief Complaint   Patient presents with   . Seizures   . Altered Mental Status       Nurses Notes reviewed and I agree except as noted in the HPI.      HISTORY OF PRESENT ILLNESS    Theresa Hobbs is a 45 y.o. female who presents brought in by family by car. Apparently had been seen 3 times in the last week.  Once at [facility] and twice at [facility].  Was given Dilantin and sent home.  The family members with the patient are concerned because the patient has been confused this week and states has a history of renal cell cancer.  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 Weyman Rodney from Neurology.        REVIEW OF SYSTEMS     Review of Systems   Constitutional: Negative for fever, chills and activity change.   HENT: Negative for ear pain, congestion, sore throat and rhinorrhea.    Eyes: Negative for pain, discharge and itching.   Respiratory: Negative for cough, shortness of breath and wheezing.    Cardiovascular: Negative for chest pain.   Gastrointestinal: Negative for nausea, vomiting, abdominal pain and diarrhea.   Genitourinary: Negative for dysuria and difficulty urinating.   Musculoskeletal: Negative for myalgias, back pain and arthralgias.   Skin: Negative for color change and rash.   Neurological: Negative for dizziness, seizures, light-headedness and headaches.        Convulsions   Psychiatric/Behavioral: Negative for suicidal ideas, confusion, self-injury and agitation.   All other systems reviewed and are negative.           PAST MEDICAL HISTORY    has a past medical history of Cancer.    SURGICAL HISTORY      has no past surgical history on file.    CURRENT MEDICATIONS       Previous Medications    BUTALBITAL-ACETAMINOPHEN-CAFFEINE (FIORICET, ESGIC) PER TABLET    Take 1 tablet by mouth every 4 hours as needed.      DIAZEPAM (VALIUM) 5 MG TABLET    Take 5 mg by mouth every 6 hours as needed.       ONDANSETRON (ZOFRAN-ODT) 4 MG DISINTEGRATING TABLET    Take 4 mg by mouth every 8 hours as needed.      PHENYTOIN (DILANTIN) 100 MG ER CAPSULE    Take  by mouth 2 times daily.      TEMAZEPAM (RESTORIL) 30 MG CAPSULE    Take 30 mg by mouth nightly as needed.      VENLAFAXINE (EFFEXOR-XR) 150 MG XR CAPSULE    Take 150 mg by mouth daily.         ALLERGIES     is allergic to augmentin; azithromycin; bactrim; ceftin; ciprofloxacin; diazepam; erythromycin; flagyl; iv dye; klonopin; levaquin; nubain; pcn; sulfa drugs; temazepam; toradol; and vicodin.    FAMILY HISTORY     has no family status information on file.  family history is not on file.    SOCIAL HISTORY      does not have a smoking history on file. She does not have any smokeless tobacco history on file.    PHYSICAL EXAM     INITIAL VITALS:  height is 5\' 2"  (1.575 m) and weight is 200 lb (90.719 kg). Her axillary temperature is  98.7 F (37.1 C). Her blood pressure is 131/69 and her pulse is 87. Her respiration is 13 and oxygen saturation is 98%.    Physical Exam   Nursing note and vitals reviewed.  Constitutional: She is oriented to person, place, and time. She appears well-developed and well-nourished.   HENT:   Head: Normocephalic and atraumatic.   Eyes: Pupils are equal, round, and reactive to light.   Neck: Normal range of motion. Neck supple.   Cardiovascular: Normal rate.    Pulmonary/Chest: Effort normal. No respiratory distress.   Abdominal: Soft. She exhibits no distension.   Musculoskeletal: Normal range of motion.   Neurological: She is alert and oriented to person, place, and time.        The patient has no deficits.  The patient does talk periodically.  The patient when was in a convulsing episode withdrew from an ammonia tablet and stopped when the nurse told her to stop   Skin: Skin is warm and dry.   Psychiatric: She has a normal mood and affect. Her behavior is normal.         DIFFERENTIAL DIAGNOSIS:   Altered mental Status      DIAGNOSTIC  RESULTS     EKG: All EKG's are interpreted by the Emergency Department Physician who either signs or Co-signs this chart in the absence of a cardiologist.  ----------Electrocardiogram----------  Heart Rate: Normal  RATE: 88  RHYTHM: Normal Sinus Rhythm  AXIS: Normal  ECTOPY: No Ectopy  CONDUCTION: PR: Normal,QRS: Normal,QT: Normal  ST -T SEGMENT: Normal  CLINICAL IMPRESSION: No Acute Change  Date: may ninth 2012Unchanged    RADIOLOGY: non-plain film images(s) such as CT, Ultrasound and MRI are read by the radiologist.  CT scan of head was negative per radiology.  Chest x-ray read per radiology as negative      LABS:   Labs Reviewed   CBC WITH AUTO DIFFERENTIAL - Abnormal; Notable for the following:     MCH 32.3 (*)     RDW 15.0 (*)     All other components within normal limits   PHENYTOIN LEVEL, TOTAL - Abnormal; Notable for the following:     Phenytoin Lvl 4.6 (*)     All other components within normal limits   GLOMERULAR FILTRATION RATE, ESTIMATED - Abnormal; Notable for the following:     Est, Glom Filt Rate 77 (*)     All other components within normal limits   URINALYSIS/URINE CULTURE - Abnormal; Notable for the following:     Blood, Urine TRACE (*)     All other components within normal limits   POCT GLUCOSE - Normal   BASIC METABOLIC PANEL   TROPONIN I   CK-MB   TSH   LACTIC ACID, PLASMA   AMMONIA   ANION GAP   OSMOLALITY   ETHANOL   RAPID DRUG SCREEN, URINE   CULTURE BLOOD #1   CULTURE BLOOD #2    Narrative:     pt is a diff stick,will ask someone from the floor to try 03/31/2011       EMERGENCY DEPARTMENT COURSE:   Vitals:    Filed Vitals:    03/31/11 2206 03/31/11 2352   BP: 154/75 131/69   Pulse: 91 87   Temp: 98.7 F (37.1 C)    TempSrc: Axillary    Resp: 17 13   Height: 5\' 2"  (1.575 m)    Weight: 200 lb (90.719 kg)    SpO2: 99% 98%  Patient was seen history physical exam was performed.  During one of these convulsing episodes the nurse held ammonia tablet under nose and she withdrew from it.  She  also stopped when the nurse told her to stop.  She became upset and told her family about this.  She has had several episodes of this.  I did explain that Dr. Weyman Rodney has diagnosed her with pseudoseizures and what we were seeing was consistent with pseudoseizures.  We are going to admit the patient to the hospital for reported confusion at home.  See disposition below    CRITICAL CARE:   Due to the immediate potential for life-threatening deterioration due to Altered mental status , I spent 60 minutes providing critical care.  This time is excluding time spent performing procedures.      CONSULTS:  Case was discussed with Dr. Julian Hy and he is going to admit the patient.    PROCEDURES:  None    FINAL IMPRESSION      1. Altered mental status          DISPOSITION/PLAN   Admit      PATIENT REFERRED TO:  No follow-up provider specified.    DISCHARGE MEDICATIONS:  New Prescriptions    No medications on file       (Please note that portions of this note were completed with a voice recognition program.  Efforts were made to edit the dictations but occasionally words are mis-transcribed.)    Hinda Glatter, PA            Jerrilyn [place], [place]  04/01/11 [phone]

## 2011-03-31 NOTE — ED Notes (Signed)
Family called nurse to room states pt is seizing again. Upon entry to room pt lying in bed still staring at ceiling  not responding to questions    Charde Macfarlane, RN  03/31/11 2302

## 2011-03-31 NOTE — ED Notes (Signed)
Pt returned to room from ct pt very agitated jeremy pa-c in to speak with pt and family    Neera Teng, RN  03/31/11 2350

## 2011-03-31 NOTE — ED Notes (Signed)
Nurse called to ct for pt jerking around in bed then after possible seizure activity pt would not follow commands, pt again staring up. Within 1-2 seconds of ammonia tab pt moved away from it and began speaking again.    Nanetta Batty, RN  03/31/11 [phone]

## 2011-03-31 NOTE — ED Notes (Signed)
While preparing medication pt speaking gibberish then one clear english word, after receiving morphine pt began speaking clear english in full sentences.    Nanetta Batty, RN  03/31/11 [phone]

## 2011-04-01 ENCOUNTER — Inpatient Hospital Stay: Admit: 2011-04-01 | Disposition: A | Discharge status: Home / Self Care | Admitting: Internal Medicine

## 2011-04-01 LAB — TSH: TSH: 1.732 mcIU/ml (ref 0.400–4.200)

## 2011-04-01 LAB — RAPID DRUG SCREEN, URINE
Amphetamine+Methamphetamine Urine Screen: NEGATIVE
Barbiturate Quant, Ur: POSITIVE
Benzodiazepine Quant, Ur: NEGATIVE
Cannabinoid Quant, Ur: NEGATIVE
Cocaine Metab Quant, Ur: NEGATIVE
Opiates, Urine: NEGATIVE
PCP Quant, Ur: NEGATIVE

## 2011-04-01 LAB — PHENYTOIN LEVEL, TOTAL: Phenytoin Lvl: 4.6 ug/mL — ABNORMAL LOW (ref 6.0–18.0)

## 2011-04-01 LAB — URINE WITH REFLEXED MICRO
Bilirubin Urine: NEGATIVE
CASTS 2: NONE SEEN /lpf
Casts UA: NONE SEEN /lpf
Crystals, UA: NONE SEEN
Glucose, Ur: NEGATIVE mg/dl
Ketones, Urine: NEGATIVE
Leukocyte Esterase, Urine: NEGATIVE
MISCELLANEOUS 2: NONE SEEN
Nitrite, Urine: NEGATIVE
Protein, UA: NEGATIVE
Renal Epithelial, UA: NONE SEEN
Specific Gravity, Urine: 1.015 (ref 1.002–1.030)
Urobilinogen, Urine: 0.2 eu/dl (ref 0.0–1.0)
Yeast, UA: NONE SEEN
pH, UA: 7.5 (ref 5.0–9.0)

## 2011-04-01 LAB — CBC WITH AUTO DIFFERENTIAL
Basophils: 0.7 % (ref 0.0–3.0)
Eosinophils: 2.2 % (ref 0.0–4.0)
Hematocrit: 41.2 % (ref 37.0–47.0)
Hemoglobin: 14.5 gm/dl (ref 12.0–16.0)
Lymphocytes: 42.9 % (ref 15.0–47.0)
MCH: 32.3 pg — ABNORMAL HIGH (ref 27.0–31.0)
MCHC: 35.2 gm/dl (ref 33.0–37.0)
MCV: 91.8 fL (ref 81.0–99.0)
MPV: 9.2 mcm (ref 7.4–10.4)
Monocytes: 6.1 % (ref 0.0–12.0)
Platelets: 211 10*3/uL (ref 130–400)
RBC Morphology: NORMAL
RBC: 4.49 10*6/uL (ref 4.20–5.40)
RDW: 15 % — ABNORMAL HIGH (ref 11.5–14.5)
Seg Neutrophils: 48.1 % (ref 43.0–75.0)
WBC: 9.8 10*3/uL (ref 4.8–10.8)
nRBC: 0 /100 wbc

## 2011-04-01 LAB — BASIC METABOLIC PANEL
BUN: 7 mg/dl (ref 7–22)
BUN: 7 mg/dl (ref 7–22)
CO2: 27 meq/l (ref 23–33)
CO2: 28 meq/l (ref 23–33)
Calcium: 9.3 mg/dl (ref 8.5–10.5)
Calcium: 9 mg/dl (ref 8.5–10.5)
Chloride: 110 meq/l (ref 98–111)
Chloride: 109 meq/l (ref 98–111)
Creatinine: 0.8 mg/dl (ref 0.4–1.2)
Creatinine: 0.8 mg/dl (ref 0.4–1.2)
Glucose: 106 mg/dl (ref 70–108)
Glucose: 104 mg/dl (ref 70–108)
Potassium: 3.8 meq/l (ref 3.5–5.2)
Potassium: 3.6 meq/l (ref 3.5–5.2)
Sodium: 144 meq/l (ref 135–145)
Sodium: 142 meq/l (ref 135–145)

## 2011-04-01 LAB — OSMOLALITY: Osmolality Calc: 285.2 mOsmol/kg (ref 275.0–300.0)

## 2011-04-01 LAB — GLOMERULAR FILTRATION RATE, ESTIMATED
Est, Glom Filt Rate: 77 mL/min/{1.73_m2} — AB
Est, Glom Filt Rate: 77 mL/min/{1.73_m2} — AB

## 2011-04-01 LAB — POCT GLUCOSE
Glucose: 101 mg/dL
POC Glucose: 101 mg/dl (ref 70–108)

## 2011-04-01 LAB — TROPONIN: Troponin I: <0.006 ng/ml

## 2011-04-01 LAB — ETHANOL: ETHYL ALCOHOL, SERUM: <0.01 % (gm/dl)

## 2011-04-01 LAB — AMMONIA: Ammonia: 23 mcmoles/l (ref 10–47)

## 2011-04-01 LAB — LACTIC ACID: Lactic Acid: 1.5 mmol/l (ref 0.5–2.2)

## 2011-04-01 LAB — CK-MB: Total CK: 80 U/L (ref 30–135)

## 2011-04-01 LAB — ANION GAP: Anion Gap: 10.8 (ref 10.0–20.0)

## 2011-04-01 MED ADMIN — enoxaparin (LOVENOX) injection 40 mg: SUBCUTANEOUS | @ 14:00:00 | NDC 00075062040

## 2011-04-01 MED ADMIN — morphine injection 2 mg: INTRAVENOUS | @ 04:00:00 | NDC 00409176230

## 2011-04-01 MED ADMIN — morphine injection 2 mg: INTRAVENOUS | @ 06:00:00 | NDC 00409176230

## 2011-04-01 MED ADMIN — sodium chloride (PF) 0.9 % injection 10 mL: INTRAVENOUS | @ 16:00:00

## 2011-04-01 MED ADMIN — sodium chloride (PF) 0.9 % injection 10 mL: INTRAVENOUS | @ 14:00:00

## 2011-04-01 MED ADMIN — LORazepam (ATIVAN) 2 MG/ML injection: INTRAVENOUS | @ 02:00:00 | NDC 10019010239

## 2011-04-01 MED ADMIN — ondansetron (ZOFRAN) injection 4 mg: INTRAVENOUS | @ 16:00:00 | NDC 00409475503

## 2011-04-01 MED ADMIN — LORazepam (ATIVAN) injection 1 mg: INTRAVENOUS | @ 03:00:00 | NDC 10019010239

## 2011-04-01 MED ADMIN — phenytoin (DILANTIN) 1,000 mg in sodium chloride 0.9 % 250 mL IVPB: INTRAVENOUS | @ 06:00:00 | NDC 00641255545

## 2011-04-01 MED ADMIN — pantoprazole (PROTONIX) injection 40 mg: INTRAVENOUS | @ 14:00:00 | NDC 00008092351

## 2011-04-01 MED ADMIN — gadoversetamide (OPTIMARK) injection 20 mL: INTRAVENOUS | @ 18:00:00 | NDC 00019117721

## 2011-04-01 MED ADMIN — diphenhydrAMINE (BENADRYL) injection 12.5 mg: INTRAVENOUS | @ 23:00:00 | NDC 00641037621

## 2011-04-01 MED ADMIN — LORazepam (ATIVAN) injection 1 mg: INTRAVENOUS | @ 18:00:00 | NDC 10019010239

## 2011-04-01 MED ADMIN — diphenhydrAMINE (BENADRYL) injection 12.5 mg: INTRAVENOUS | @ 20:00:00 | NDC 00641037621

## 2011-04-01 MED ADMIN — ondansetron (ZOFRAN) injection 4 mg: INTRAVENOUS | @ 03:00:00 | NDC 00409475503

## 2011-04-01 MED ADMIN — ondansetron (ZOFRAN) injection 4 mg: INTRAVENOUS | @ 23:00:00 | NDC 00409475503

## 2011-04-01 MED ADMIN — morphine injection 2 mg: INTRAVENOUS | @ 03:00:00 | NDC 00409176230

## 2011-04-01 MED ADMIN — ondansetron (ZOFRAN) injection 4 mg: INTRAVENOUS | @ 09:00:00 | NDC 00409475503

## 2011-04-01 MED ADMIN — HYDROmorphone (DILAUDID) injection 2 mg: 2 mg | INTRAVENOUS | @ 14:00:00 | NDC 00409128331

## 2011-04-01 MED ADMIN — HYDROmorphone (DILAUDID) injection 2 mg: 2 mg | INTRAVENOUS | @ 23:00:00 | NDC 00409128331

## 2011-04-01 MED ADMIN — HYDROmorphone (DILAUDID) injection 2 mg: 2 mg | INTRAVENOUS | @ 18:00:00 | NDC 00409128331

## 2011-04-01 MED FILL — SUCRALFATE 1 GM/10ML PO SUSP: 1 GM/0ML | ORAL | Qty: 10

## 2011-04-01 MED FILL — PHENYTOIN SODIUM EXTENDED 100 MG PO CAPS: 100 MG | ORAL | Qty: 1

## 2011-04-01 MED FILL — ONDANSETRON 4 MG PO TBDP: 4 MG | ORAL | Qty: 1

## 2011-04-01 MED FILL — BUTALBITAL-APAP-CAFFEINE 50-325-40 MG PO TABS: 50-325-40 MG | ORAL | Qty: 1

## 2011-04-01 MED FILL — PROTONIX 40 MG IV SOLR: 40 mg | INTRAVENOUS | Qty: 40

## 2011-04-01 MED FILL — BUTALBITAL-APAP-CAFFEINE 50-325-40 MG PO TABS: 50-325-40 mg | ORAL | Qty: 1

## 2011-04-01 MED FILL — HYDROMORPHONE HCL 1 MG/ML IJ SOLN: 1 MG/ML | INTRAMUSCULAR | Qty: 2

## 2011-04-01 MED FILL — LOVENOX 40 MG/0.4ML SC SOLN: 40 MG/0.4ML | SUBCUTANEOUS | Qty: 0.4

## 2011-04-01 MED FILL — ONDANSETRON HCL 4 MG/2ML IJ SOLN: 4 MG/2ML | INTRAMUSCULAR | Qty: 2

## 2011-04-01 MED FILL — TEMAZEPAM 15 MG PO CAPS: 15 MG | ORAL | Qty: 2

## 2011-04-01 MED FILL — HYDROMORPHONE HCL 1 MG/ML IJ SOLN: 1 MG/ML | INTRAMUSCULAR | Qty: 1

## 2011-04-01 MED FILL — VENLAFAXINE HCL ER 150 MG PO CP24: 150 mg | ORAL | Qty: 1

## 2011-04-01 MED FILL — PHENYTOIN SODIUM 50 MG/ML IJ SOLN: 50 MG/ML | INTRAMUSCULAR | Qty: 20

## 2011-04-01 MED FILL — PHENYTOIN SODIUM 50 MG/ML IJ SOLN: 50 mg/mL | INTRAMUSCULAR | Qty: 20

## 2011-04-01 MED FILL — VENLAFAXINE HCL ER 150 MG PO CP24: 150 MG | ORAL | Qty: 1

## 2011-04-01 MED FILL — MORPHINE SULFATE 2 MG/ML IJ SOLN: 2 mg/mL | INTRAMUSCULAR | Qty: 1

## 2011-04-01 MED FILL — DIPHENHYDRAMINE HCL 50 MG/ML IJ SOLN: 50 mg/mL | INTRAMUSCULAR | Qty: 1

## 2011-04-01 MED FILL — PHENYTOIN SODIUM EXTENDED 100 MG PO CAPS: 100 mg | ORAL | Qty: 1

## 2011-04-01 MED FILL — LORAZEPAM 2 MG/ML IJ SOLN: 2 mg/mL | INTRAMUSCULAR | Qty: 1

## 2011-04-01 NOTE — Progress Notes (Signed)
Pt stated to this nurse and also physician that she does not want any changes in her medications that she is very pleased with the amount of pain medications she is receiving at this time.
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
## 2011-04-01 NOTE — ED Notes (Signed)
Pt asking for more pain medication dr Lennette Bihari aware.    Nanetta Batty, RN  04/01/11 [phone]

## 2011-04-01 NOTE — Progress Notes (Signed)
Pharmacy Enoxaparin Consult    Theresa Hobbs is a 45 y.o. female. Pharmacy has been consulted to adjust enoxaparin for BMI per P&T protocol.    Recent Labs   [facility] 03/31/11 2210    CREATININE 0.8       Estimated Creatinine Clearance: 103.6 ml/min (based on Cr of 0.8).    Height:   Ht Readings from Last 1 Encounters:   04/01/11 5' 2.01" (1.575 m)     Weight:  Wt Readings from Last 1 Encounters:   04/01/11 241 lb 13.5 oz (109.7 kg)       Enoxaparin Indication: prophylaxis    Plan: Based upon BMI 44.3, the enoxaparin dose should be adjusted to 40 mg q12h.    Dot Lanes J AmstutzPharmD  04/01/2011   5:05 AM

## 2011-04-01 NOTE — ED Notes (Signed)
Pt refusing dilantin at this time. Pt states she will not take dilantin without pain medication and pt wants to speak with dr. Dr Lennette Bihari notified of pts request.    Nanetta Batty, RN  04/01/11 [phone]

## 2011-04-01 NOTE — Progress Notes (Signed)
Pt resting in bed, pain management discussed

## 2011-04-01 NOTE — ED Notes (Signed)
Pt unable to speak words at this time communicates with nods and pointing.    Nanetta Batty, RN  04/01/11 0230

## 2011-04-01 NOTE — Progress Notes (Signed)
Hospitalist Progress Note    Patient:  Theresa Hobbs  Date of Birth: 1966/05/15    MRN: [id number]     Acct: [id number]     Admit date: 04/01/2011    Subjective: pt still having headaches. Some panic attack this at MRI requiring anxiolytics2  Diet:  General      Medications:  Scheduled Meds:     ??? phenytoin  100 mg Oral BID   ??? venlafaxine  150 mg Oral Daily   ??? sodium chloride (PF)  10 mL Intravenous Q12H [facility]   ??? sucralfate  1 g Oral 4x Daily AC & HS   ??? pantoprazole  40 mg Intravenous Daily    And   ??? sodium chloride (PF)  10 mL Intravenous Daily   ??? enoxaparin (LOVENOX) injection  40 mg Subcutaneous Q12H [facility]     Continuous Infusions:   PRN Meds:butalbital-acetaminophen-caffeine, temazepam, sodium chloride (PF), diazepam, ondansetron, ondansetron, HYDROmorphone    Objective:    CBC:   Recent Labs   Basename 03/31/11 2210    WBC 9.8    HGB 14.5    PLT 211     BMP:  Recent Labs   Basename 04/01/11 0511 03/31/11 2215 03/31/11 2210    NA 142 -- 144    K 3.6 -- 3.8    CL 109 -- 110    CO2 28 -- 27    BUN 7 -- 7    CREATININE 0.8 -- 0.8    GLUCOSE 104 101 106     Calcium:  Recent Labs   Basename 04/01/11 0511    CALCIUM 9.0     Ionized Calcium:No results found for this basename: IONCA in the last 72 hours  Magnesium:No results found for this basename: MG in the last 72 hours  Phosphorus:No results found for this basename: PHOS in the last 72 hours  BNP:No results found for this basename: BNP in the last 72 hours  Glucose:No results found for this basename: POCGLU:3 in the last 72 hours  HgbA1C: No results found for this basename: LABA1C in the last 72 hours  INR: No results found for this basename: INR in the last 72 hours  Hepatic: No results found for this basename: ALKPHOS,ALT,AST,PROT,BILITOT,BILIDIR,LABALBU in the last 72 hours  Amylase and Lipase:  Recent Labs   Basename 03/31/11 2300    LACTA 1.5    AMYLASE --     Lactic Acid:   Recent Labs   Basename 03/31/11 2300    LACTA 1.5     Troponin: Recent Labs   Basename  03/31/11 2210    CKTOTAL 80    CKMB --    TROPONINI &lt;0.006     BNP: No results found for this basename: BNP:3 in the last 72 hours  Lipids: No results found for this basename: CHOL,TRIG,HDL,LDL,LDLCALC in the last 72 hours  ABGs: Lab Results   Component Value Date    PHART 7.40 01/11/2011    HCO3ART 26 01/11/2011       Physical Exam:  Vitals: BP 140/77   Pulse 87   Temp(Src) 98.5 ??F (36.9 ??C) (Oral)   Resp 18   Ht 5' 2.01" (1.575 m)   Wt 241 lb 13.5 oz (109.7 kg)   BMI 44.22 kg/m2   SpO2 94%   Breastfeeding? No  24 hour intake/output:  Intake/Output Summary (Last 24 hours) at 04/01/11 1450  Last data filed at 04/01/11 1349   Gross per 24 hour   Intake 1443.98 ml  Output   1100 ml   Net 343.98 ml     Last 3 weights:  Wt Readings from Last 3 Encounters:   04/01/11 241 lb 13.5 oz (109.7 kg)         General appearance - alert, well appearing, and in no distress  Chest - clear to auscultation, no wheezes, rales or rhonchi, symmetric air entry  Distant Breath Sounds: No  Heart - normal rate, regular rhythm, normal S1, S2, no murmurs, rubs, clicks or gallops  Abdomen - soft, nontender, nondistended, no masses or organomegaly  Obese: No; Protuberant: No   Neurological - alert, oriented, normal speech, no focal findings or movement disorder noted  Musculoskeletal - no joint tenderness, deformity or swelling  Extremities - peripheral pulses normal, no pedal edema, no clubbing or cyanosis      Assessment:    Active Problems:   Seizure   Head ache   Nausea and vomiting   Confusion   Obesity   Diarrhea   Cancer of kidney   PTSD (post-traumatic stress disorder)          Plan:  1.Await MRI results  2. Avoid narcotics  3. May need an LP, if symptoms don't improve  See Orders.    Henli Hey T. Courtnee Myer, MD  Rounding Hospitalist

## 2011-04-01 NOTE — H&P (Signed)
[facility]                                    [place], [place]                                   HISTORY and PHYSICAL     PATIENT NAME: Theresa Hobbs, Theresa Hobbs.                  DOB:  03/05/1966  MEDICAL RECORD NO. [id number]                 ROOM: 4A 0003  ACCOUNT NO: [id number]                          DATE: 04/01/2011  PHYSICIAN: Farzana Koci A. Julian Hy, M.D.        CHIEF COMPLAINT:  Confusion of 1 week duration and seizure of 1 day duration.     HISTORY OF PRESENT ILLNESS:  This is a 45 year old female patient who was  brought in by her husband and friends complaining of a 1 week duration of  confusion.  According to her friends who have been with her for the past 1  week, state that the patient has been having spells of confusion.  She does  not know where she is.  She was not answering appropriate answers for the  questions they asked.  She did not remember her birth date.  She did not  remember the wedding anniversary of hers  and she has been going on for the  past 1 week.  she was taken to [facility] Emergency Room [facility] and [facility] 1 time with no help and today at home, they  decided to bring her to our emergency room for further help.  While they were  driving her to the hospital, the patient had a 3 episodes of seizure activity  which they described as tonic clonic in character with loss of consciousness  with rolling of her eyes with no foaming but the patient said she feels like  all her extremities are weak after the seizure activity.  She admitted to  have urinated on herself but she denied any tongue biting.  Each episode  lasted about 10-15 minutes according to the patient's friend and the patient  sometimes would go into another seizure within 5 minutes.  The patient was  seen by Dr. Weyman Rodney, who is a neurologist here and she was told to have  pseudoseizure.  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  She  admitted to have been taking her  medication properly but when they checked a Dilantin level, it was found to  be very low in the Emergency Room.  The patient said the reason why her  levels are low is because she has been persistently vomiting over the past  few days and also since they increased the dose of her chemotherapy for  kidney cancer.  She has been having persistent diarrhea about 10-15 times in  a day.  For this reason, she thinks that her Dilantin is not getting into her  blood.  The patient has a history of migraine headaches and still she  complains of global headache  which is 9/10 in severity.  No aggravating or  relieving factors.  No photophobia or no ringing in the ears.  She denied  currently, no arm or leg weakness.  Once the patient came to the Emergency  Room, she had episodes of seizure for which she was given Ativan IV and she  was also given a loading dose of Dilantin IV.  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     REVIEW OF SYSTEMS:  The patient denied any fever or chills.  No excessive  sweating.  HEENT:  Headache as described in HPI.  No blurry vision.  No  difficulty swallowing.  RESPIRATORY:  No difficulty breathing or cough or  wheezing.  CARDIOVASCULAR:  No chest pain or palpitations.  GASTROINTESTINAL:  She has nausea and vomiting as described in HPI with diarrhea.  GENITOURINARY  SYSTEM:  No urinary urgency, frequency or dysuria.  MUSCULOSKELETAL:  No leg  swelling, edema or joint pain.  INTEGUMENT:  No skin rash.  NEUROLOGIC:  As  described in HPI.     PAST MEDICAL HISTORY:  Possible pseudoseizure but she has been treated with  Keppra and Dilantin.  History of multiple sclerosis, plaque in her back,  lupus, renal cell carcinoma status post partial nephrectomy and  chemoradiation, peptic ulcer disease,  migraine headache, PTSD and major  depression, previous admissions for suicidal ideations.     PAST SURGICAL HISTORY:  Cholecystectomy, nasal polyp removal and nasal septal  deviation correction, partial nephrectomy for cancer.     ALLERGIES:  THE PATIENT IS ALLERGIC TO ALL LOCAL ANESTHETIC AGENTS,  AZITHROMYCIN, FLAGYL, IV DYE, KLONPIN, LEVAQUIN, NUBAIN, PENICILLIN, SULFA  DRUGS, TEMAZEPAM, TORADOL, AND VICODIN.     HOME MEDICATIONS:  Effexor XR 150 mg once a day, Valium 5 mg every 6 hour,  Fioricet 1 tablet every 4 hours, Restoril 30 mg once a day, Zofran 4 mg once  a day, phenytoin 100 mg extended release two times a day.     SOCIAL HISTORY:  The patient smokes occasionally.  She said sometimes she  does not smoke any cigarettes.  Most of time, she smokes up to 3 cigarettes a  day.  When she is stressed out, she can smoke up to 1 pack and sometimes up  to 2 packs per day in the past.  She said she quit in 2006.  Denied alcohol  or drug use as she is an ex[employer] woman.  She worked as a Engineer, civil (consulting) in the  [employer].     FAMILY HISTORY:  She is adopted.  She does not know much about her biological  parents.  She heard that her father died of pancreatic cancer.  Her mother  died when she was 89 years old.  She also admitted to have a lot of mental  illness in her family.  1 of her brothers, she said he was a pedophile and  there is also some alcohol problem in 1 of her siblings.  She has a sister  who was addicted to drugs.     PHYSICAL EXAMINATION:  VITAL SIGNS:  Blood pressure 130/69, pulse rate 87,  respiratory rate 13, temperature 98.7.  GENERAL APPEARANCE:  An obese lady,  alert but sometimes irritable easily with an intermittent tendency to start  and pretending to have  another attack of seizure but only to come back into  the discussion.  HEENT:  Normal pink conjunctivae, anicteric sclerae.  No  tenderness over her sinuses.     RESPIRATORY:  Chest is clear to auscultation bilaterally.  No crackles or  wheezes.      CARDIOVASCULAR:  No JVD.  S1-S2 with no murmur or gallop.     ABDOMEN:  Soft, nontender.  Normoactive bowel sounds.     MUSCULOSKELETAL:  No edema.     INTEGUMENT:  No skin rash.     NEUROLOGICAL:  Alert and oriented to time, person and place.  No focal  neurologic deficit.     LABORATORY EVALUATION:  Sodium 144, potassium 3.8, chloride 110, bicarb 27,  glucose 106, bicarb 7, creatinine 0.8, calcium 9.3, troponin less than 0.006.  CK 80.  Lactic acid 1.5, ammonia level 23.  Serum osmolality 285.2, TSH  1.732, WBC 9.8, hemoglobin 14.5, hematocrit 41.2, platelet 211,000.  Phenytoin level 4.6, normal range being 6.0-18.  Blood culture is pending.  Urine toxicology study is positive for barbiturates, but it is negative for  cocaine, opiates, amphetamine and benzodiazepines.  Urinalysis:  Trace blood,  otherwise essentially normal finding.     CT head without IV contrast:  No official report but no acute pathology.  No  acute intracranial pathology.  No midline shift or mass effect.     ASSESSMENT/PLAN:    1. Seizure which is uncontrolled with subtherapeutic Dilantin level.  The       patient has been given a loading dose in the Emergency Room.  We will       check her Dilantin level, monitor and continue her maintenance dose of       Dilantin.  The patient has a note in her old records saying that she had       pseudo seizure.  It is very difficult to ascertain and we will also call       a neurologist to see her.  She does not want to see the neurologist that       evaluated her before.  She is also concerned about her confusion       including her family members.  We will need the input of the neurologist       still.  Psychosomatic issues should be considered also.    2. Headache.  The patient has a history of migraine headache.  She has been       on Fioricet, she states.  She is reporting that her symptoms are not       well controlled.  We will resume her medication, add Dilaudid for better       pain control.   Still medication seeking behavior should be considered as there       is a mention of this in her old record.    3. Diarrhea with no abdominal pain which has been going on for more than a       week.  We will get stool for WBC.  If it is negative, we may give her       Imodium.    4. Nausea and vomiting, cause unknown.  The patient has a history of peptic       ulcer disease in the past. We may need to give her some Carafate and add       some Protonix if this helps her.    5. History of cancer of the  kidney.  No intervention at this time.  She       will follow with her oncologist.            Catrinia Racicot A. Julian Hy, M.D.        D: 04/01/2011 03:16                                    T: 04/01/2011 07:26  bw     CC:  Marlisa Caridi A. Julian Hy, M.D.  [address]  [address]  [place], [place] [address]

## 2011-04-01 NOTE — Progress Notes (Signed)
Family states pt had seizure.  Pt mumbling and pointing to her head and stomach, when asked if she wants pain medication pt shakes her head yes,  Pt able to squeeze hands and follow commands.

## 2011-04-01 NOTE — ED Notes (Signed)
Pt ambulated to nurses station, carrying foley bag with iv disconnected. Pt ambulated back to room with nurse and steady gait pt states she has head pain and belly pain. Upon entering the room and getting back into bed pt is again unable to speak. Pt mumbles gibberish. Pt placed back on iv and monitor. Dr notified.    Nanetta Batty, RN  04/01/11 0300

## 2011-04-01 NOTE — Progress Notes (Signed)
Removed foley, pt tolerated well, tip intact, no issues or concerns

## 2011-04-01 NOTE — Consults (Incomplete)
NEUROLOGY CONSULT NOTE       Requesting Physician: Carmin [place], MD     Reason for Consult:  Evaluate for ***    History of Present Illness:  Theresa Hobbs is a 45 y.o. female admitted to [facility]. [facility] on 04/01/2011.    Patient Active Problem List   Diagnoses   ??? Seizure   ??? Head ache   ??? Nausea and vomiting   ??? Confusion   ??? Obesity   ??? Diarrhea   ??? Cancer of kidney   ??? PTSD (post-traumatic stress disorder)     ***    Past Medical History:        Diagnosis Date   ??? Cancer    ??? Chronic kidney disease    ??? Psychiatric problem    ??? Seizures      Pt voiced she has seizures.           Procedure Date   ??? Abdomen surgery    ??? Cholecystectomy        Allergies:    Allergies   Allergen Reactions   ??? Augmentin    ??? Azithromycin    ??? Bactrim    ??? Ceftin    ??? Ciprofloxacin    ??? Diazepam    ??? Erythromycin    ??? Flagyl (ZOX:WRUEAV Acid+Methylparaben+Metronidazole+Propylene Glycol+...)    ??? Iv Dye (Iodides)    ??? Klonopin (Clonazepam)    ??? Levaquin    ??? Nubain (Nalbuphine Hcl)    ??? Pcn (Penicillins)    ??? Sulfa Drugs    ??? Temazepam    ??? Toradol    ??? Vicodin (Hydrocodone-Acetaminophen)         Current Medications:     butalbital-acetaminophen-caffeine (FIORICET, ESGIC) per tablet 1 tablet Q4H PRN   phenytoin (DILANTIN) ER capsule 100 mg BID   temazepam (RESTORIL) capsule 30 mg Nightly PRN   venlafaxine (EFFEXOR-XR) XR capsule 150 mg Daily   sodium chloride (PF) 0.9 % injection 10 mL Q12H SCH   sodium chloride (PF) 0.9 % injection 10 mL PRN   diazepam (VALIUM) injection 5 mg Q4H PRN   sucralfate (CARAFATE) 1 GM/10ML suspension 1 g 4x Daily AC & HS   pantoprazole (PROTONIX) injection 40 mg Daily   And    sodium chloride (PF) 0.9 % injection 10 mL Daily   ondansetron (ZOFRAN-ODT) disintegrating tablet 4 mg Q6H PRN   Or    ondansetron (ZOFRAN) injection 4 mg Q6H PRN   HYDROmorphone (DILAUDID) injection 2 mg Q4H PRN   enoxaparin (LOVENOX) injection 40 mg Q12H [facility]        Social History:  History   Smoking status   ??? Current  Everyday Smoker -- 1.0 packs/day   Smokeless tobacco   ??? Not on file     History   Alcohol Use No     History   Drug Use No     Married         Family History:       Problem Relation Age of Onset   ??? Cancer Father        Review of Systems:  CONSTITUTIONAL:  negative for  {CONSTITUTIONAL SYMPTOMS:304530011}  HEENT:  negative for  {HEENT SYMPTOMS:304530031}  RESPIRATORY:  negative for  {RESPIRATORY WUJWJXBJ:478295621}  CARDIOVASCULAR:  negative for  {CARDIAC HYQMVHQI:696295284}  GASTROINTESTINAL:  negative for {GASTROINTESTINAL SYMPTOMS:304530023}  HEMATOLOGIC/LYMPHATIC:  negative for {HEMATOLOGIC XLKGMWNU:272536644}  MUSCULOSKELETAL:  negative for  {MUSCULOSKELETAL IHKVQQVZ:563875643}  BEHAVIOR/PSYCH:  negative for {PSYCHIATRIC  NWGNFAOZ:308657846}  SKIN: negative for {:21509}  GENITOURINARY: negative for {Ros-gu negatives:5190}.    Physical Exam:  BP 134/63   Pulse 83   Temp(Src) 98.3 ??F (36.8 ??C) (Oral)   Resp 16   Ht 5' 2.01" (1.575 m)   Wt 241 lb 13.5 oz (109.7 kg)   BMI 44.22 kg/m2   SpO2 92%   Breastfeeding? No I Body mass index is 44.22 kg/(m^2). I Wt Readings from Last 1 Encounters:   04/01/11 241 lb 13.5 oz (109.7 kg)        MUSCULOSKELETAL:  {Exam; musculoskeletal muscle:30352}  CONSTITUTIONAL:  {CONSTITUTIONAL:304540358}   SKIN:  {NGEX:528413244}  NEUROLOGIC:  Mental Status Exam:  {MENTAL STATUS WNUU:725366440}  Cranial Nerves:  {CRANIAL HKVQQV:956387564}  Motor Exam:  {PPIRJ:188416606}  Sensory:  {SENSORY:304540342}  Coordination:  Gait:  {TKZS:010932355}  Deep Tendon Reflexes:  {DEEP TENDON REFLEXES:304540328}  Plantar Response:  {PLANTAR RESPONSE:304540169}      Diagnostics:  CBC: Lab Results   Component Value Date    WBC 9.8 03/31/2011    WBC 7.3 01/22/2011    RBC 4.49 03/31/2011    RBC 4.40 01/22/2011    HGB 14.5 03/31/2011    HCT 41.2 03/31/2011    MCV 91.8 03/31/2011    MCH 32.3 03/31/2011    MCHC 35.2 03/31/2011    RDW 15.0 03/31/2011    PLT 211 03/31/2011    MPV 9.2 03/31/2011     CMP:  Lab Results   Component  Value Date    NA 142 04/01/2011    NA 142 01/11/2011    K 3.6 04/01/2011    K 4.4 01/11/2011    CL 109 04/01/2011    CO2 28 04/01/2011    BUN 7 04/01/2011    BUN 15 01/22/2011    CREATININE 0.8 04/01/2011    CREATININE 0.8 01/22/2011    LABGLOM 77 04/01/2011    GLUCOSE 104 04/01/2011    GLUCOSE 146 01/22/2011    PROT 6.3 01/22/2011    LABALBU 4.1 01/22/2011    CALCIUM 9.0 04/01/2011    BILITOT 0.1 01/22/2011    ALKPHOS 133 01/22/2011    AST 18 01/22/2011    ALT 27 01/22/2011     PT/INR:    No results found for this basename: PROTIME, INR     U/A:    {DRUG DDUKGU:542706237}    Impression:    Patient Active Problem List   Diagnoses   ??? Seizure   ??? Head ache   ??? Nausea and vomiting   ??? Confusion   ??? Obesity   ??? Diarrhea   ??? Cancer of kidney   ??? PTSD (post-traumatic stress disorder)      She is describing headaches that are increased in the last     Recommendations:  1. MRI brain with and without contrast given her history of renal cell cancer with headache.   2. EEG  3. Continue the keppra, dilantin (no changes). The patient's only request is that i change nothing of her pain medications as she is doing well on the current combination she is on.     It was my pleasure to evaluate Morene Rankins today.  Please call with questions.    Ma Hillock. Hitesh Fouche, MD   04/01/2011 10:33 AM

## 2011-04-01 NOTE — ED Provider Notes (Signed)
Physician Note:    I have personally performed and/or participated in the history, exam and medical decision making and agree with all pertinent clinical information.  I have also reviewed and agree with the past medical, family and social history unless otherwise noted.      Evaluation:      I did not initially recognize this patient at first but in retrospect she is actually very well-known to [place].  She was brought in emergently from a car outside the lobby with seizure activity.  She was seen right away by my physician's assistant, who felt that the seizure activity appeared to be quite real at the time.  She was shaking.  She did receive some Ativan.  She had been registered under the name Theresa Hobbs.  Her mental status did not seem to normalize after this.  She was rambling and babbling.  Was never very coherent for [place] but did seem to be complaining of headache pain.    During the initial seizure, according to my physician's assistant she did not respond to an ammonia salt.    In any case, her CT is come back negative. I did chance to review her old records. In the past, we have seen this patient under the name of Theresa Hobbs, and I was told that she has been at [facility] under the name "Theresa Hobbs" unstead of "Valire". 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 So it really isn't clear what is going on, but she's had multiple episodes since the original one while she's been here.  She has received multiple doses of Ativan.  Her Dilantin level is once again low and I have loaded her with Dilantin 1000 mg IV.  She is repeatedly now asking for pain medicine for her headache.    We contacted Dr. Julian Hy to arrange for admission I spoke to Dr. Weyman Rodney who has been seeing her previously.    CRITICAL CARE: There was a high probability of clinically significant/life threatening deterioration in this patient's condition which required my urgent  intervention.  Total critical care time was 30 minutes.  This excludes any time for separately reportable procedures.        Max Sane, DO         Max Sane, DO  04/01/11 0107

## 2011-04-02 LAB — CBC WITH AUTO DIFFERENTIAL
Basophils: 0.5 % (ref 0.0–3.0)
Eosinophils: 1.1 % (ref 0.0–4.0)
Hematocrit: 41.5 % (ref 37.0–47.0)
Hemoglobin: 14.6 gm/dl (ref 12.0–16.0)
Lymphocytes: 20.1 % (ref 15.0–47.0)
MCH: 32.6 pg — ABNORMAL HIGH (ref 27.0–31.0)
MCHC: 35.3 gm/dl (ref 33.0–37.0)
MCV: 92.3 fL (ref 81.0–99.0)
MPV: 9.5 mcm (ref 7.4–10.4)
Monocytes: 4.2 % (ref 0.0–12.0)
Platelets: 230 10*3/uL (ref 130–400)
RBC Morphology: NORMAL
RBC: 4.5 10*6/uL (ref 4.20–5.40)
RDW: 15.1 % — ABNORMAL HIGH (ref 11.5–14.5)
Seg Neutrophils: 74.1 % (ref 43.0–75.0)
WBC: 11.9 10*3/uL — ABNORMAL HIGH (ref 4.8–10.8)
nRBC: 0 /100 wbc

## 2011-04-02 LAB — PHENYTOIN LEVEL, TOTAL: Phenytoin Lvl: 8 ug/mL (ref 6.0–18.0)

## 2011-04-02 MED ADMIN — sucralfate (CARAFATE) 1 GM/10ML suspension 1 g: ORAL | @ 20:00:00 | NDC 66689079001

## 2011-04-02 MED ADMIN — diphenhydrAMINE (BENADRYL) injection 25 mg: INTRAVENOUS | @ 16:00:00 | NDC 00641037621

## 2011-04-02 MED ADMIN — enoxaparin (LOVENOX) injection 40 mg: SUBCUTANEOUS | @ 14:00:00 | NDC 00075062040

## 2011-04-02 MED ADMIN — sucralfate (CARAFATE) 1 GM/10ML suspension 1 g: ORAL | @ 16:00:00 | NDC 66689079001

## 2011-04-02 MED ADMIN — acetaminophen (TYLENOL) 650 MG suppository: RECTAL | @ 09:00:00 | NDC 00713016501

## 2011-04-02 MED ADMIN — pantoprazole (PROTONIX) injection 40 mg: INTRAVENOUS | @ 14:00:00 | NDC 00008092351

## 2011-04-02 MED ADMIN — diphenhydrAMINE (BENADRYL) injection 25 mg: INTRAVENOUS | @ 05:00:00 | NDC 00641037621

## 2011-04-02 MED ADMIN — ondansetron (ZOFRAN) injection 4 mg: INTRAVENOUS | @ 11:00:00 | NDC 00409475503

## 2011-04-02 MED ADMIN — LORazepam (ATIVAN) 2 MG/ML injection: @ 23:00:00 | NDC 10019010239

## 2011-04-02 MED ADMIN — sodium chloride (PF) 0.9 % injection 10 mL: INTRAVENOUS | @ 15:00:00

## 2011-04-02 MED ADMIN — diazepam (VALIUM) injection 5 mg: INTRAVENOUS | @ 21:00:00 | NDC 00409127332

## 2011-04-02 MED ADMIN — sodium chloride (PF) 0.9 % injection 10 mL: INTRAVENOUS | @ 14:00:00

## 2011-04-02 MED ADMIN — diphenhydrAMINE (BENADRYL) injection 25 mg: INTRAVENOUS | @ 23:00:00 | NDC 00641037621

## 2011-04-02 MED ADMIN — enoxaparin (LOVENOX) injection 40 mg: SUBCUTANEOUS | @ 03:00:00 | NDC 00075062040

## 2011-04-02 MED ADMIN — sodium chloride (PF) 0.9 % injection 10 mL: INTRAVENOUS | @ 03:00:00

## 2011-04-02 MED ADMIN — LORazepam (ATIVAN) injection 0.5 mg: INTRAVENOUS | @ 22:00:00 | NDC 00641604401

## 2011-04-02 MED ADMIN — ondansetron (ZOFRAN) injection 4 mg: INTRAVENOUS | @ 16:00:00 | NDC 00409475503

## 2011-04-02 MED ADMIN — ondansetron (ZOFRAN) injection 4 mg: INTRAVENOUS | @ 05:00:00 | NDC 00409475503

## 2011-04-02 MED ADMIN — ondansetron (ZOFRAN) injection 4 mg: INTRAVENOUS | @ 23:00:00 | NDC 00409475503

## 2011-04-02 MED ADMIN — HYDROmorphone (DILAUDID) injection 1 mg: INTRAVENOUS | @ 20:00:00 | NDC 00409128331

## 2011-04-02 MED ADMIN — diphenhydrAMINE (BENADRYL) injection 25 mg: INTRAVENOUS | @ 11:00:00 | NDC 00641037621

## 2011-04-02 MED ADMIN — phenytoin (DILANTIN) ER capsule 100 mg: ORAL | @ 14:00:00 | NDC 51672411101

## 2011-04-02 MED ADMIN — butalbital-acetaminophen-caffeine (FIORICET, ESGIC) per tablet 1 tablet: 1 | ORAL | @ 20:00:00 | NDC 68084039611

## 2011-04-02 MED ADMIN — butalbital-acetaminophen-caffeine (FIORICET, ESGIC) per tablet 1 tablet: 1 | ORAL | @ 15:00:00 | NDC 68084039611

## 2011-04-02 MED ADMIN — HYDROmorphone (DILAUDID) injection 2 mg: 2 mg | INTRAVENOUS | @ 16:00:00 | NDC 00409128331

## 2011-04-02 MED ADMIN — HYDROmorphone (DILAUDID) injection 2 mg: 2 mg | INTRAVENOUS | @ 12:00:00 | NDC 00409128331

## 2011-04-02 MED ADMIN — HYDROmorphone (DILAUDID) injection 2 mg: 2 mg | INTRAVENOUS | @ 03:00:00 | NDC 00409128331

## 2011-04-02 MED ADMIN — HYDROmorphone (DILAUDID) injection 2 mg: 2 mg | INTRAVENOUS | @ 08:00:00 | NDC 00409128331

## 2011-04-02 MED ADMIN — HYDROmorphone (DILAUDID) injection 2 mg: 2 mg | INTRAVENOUS | @ 23:00:00 | NDC 00409128331

## 2011-04-02 MED FILL — PROTONIX 40 MG IV SOLR: 40 mg | INTRAVENOUS | Qty: 40

## 2011-04-02 MED FILL — BUTALBITAL-APAP-CAFFEINE 50-325-40 MG PO TABS: 50-325-40 MG | ORAL | Qty: 1

## 2011-04-02 MED FILL — LORAZEPAM 0.5 MG PO TABS: 0.5 MG | ORAL | Qty: 1

## 2011-04-02 MED FILL — ACEPHEN 650 MG RE SUPP: 650 MG | RECTAL | Qty: 1

## 2011-04-02 MED FILL — ONDANSETRON 4 MG PO TBDP: 4 MG | ORAL | Qty: 1

## 2011-04-02 MED FILL — SUCRALFATE 1 GM/10ML PO SUSP: 1 GM/0ML | ORAL | Qty: 10

## 2011-04-02 MED FILL — HYDROMORPHONE HCL 1 MG/ML IJ SOLN: 1 MG/ML | INTRAMUSCULAR | Qty: 2

## 2011-04-02 MED FILL — DIPHENHYDRAMINE HCL 50 MG/ML IJ SOLN: 50 mg/mL | INTRAMUSCULAR | Qty: 1

## 2011-04-02 MED FILL — HYDROMORPHONE HCL 1 MG/ML IJ SOLN: 1 MG/ML | INTRAMUSCULAR | Qty: 1

## 2011-04-02 MED FILL — ONDANSETRON HCL 4 MG/2ML IJ SOLN: 4 MG/2ML | INTRAMUSCULAR | Qty: 2

## 2011-04-02 MED FILL — PHENYTOIN SODIUM EXTENDED 100 MG PO CAPS: 100 MG | ORAL | Qty: 1

## 2011-04-02 MED FILL — VENLAFAXINE HCL ER 150 MG PO CP24: 150 MG | ORAL | Qty: 1

## 2011-04-02 MED FILL — DIAZEPAM 5 MG/ML IJ SOLN: 5 mg/mL | INTRAMUSCULAR | Qty: 2

## 2011-04-02 MED FILL — ACEPHEN 650 MG RE SUPP: 650 mg | RECTAL | Qty: 1

## 2011-04-02 MED FILL — TEMAZEPAM 15 MG PO CAPS: 15 MG | ORAL | Qty: 2

## 2011-04-02 MED FILL — PHENYTOIN SODIUM EXTENDED 100 MG PO CAPS: 100 mg | ORAL | Qty: 1

## 2011-04-02 MED FILL — LORAZEPAM 2 MG/ML IJ SOLN: 2 mg/mL | INTRAMUSCULAR | Qty: 1

## 2011-04-02 MED FILL — LOVENOX 40 MG/0.4ML SC SOLN: 40 MG/0.4ML | SUBCUTANEOUS | Qty: 0.4

## 2011-04-02 NOTE — Plan of Care (Signed)
Problem: Falls - Risk of  Goal: Absence of falls  Outcome: Ongoing  Pt. Remains stable and free of falls at this time.
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
## 2011-04-02 NOTE — Progress Notes (Addendum)
Pt. Remains on 4A03. Patient is reported to start having " seizure activity " this afternoon. Vital signs have remained stable. Patient reports in-pain the majority of the time with pain rating from 5-10. Pt. Has received Valium IV x 1, Ativan IV x 1, Benedryl IV x 3, and Zofran IV x 3.  Pt. Is scheduled for Dilantin level and EEG in the a.m. Anticipate discharge to home soon.

## 2011-04-02 NOTE — Progress Notes (Signed)
Hospitalist Progress Note    Patient:  Theresa Hobbs  Date of Birth: 09/16/1965    MRN: [id number]     Acct: [id number]     Admit date: 04/01/2011    Subjective:PT STATES FEELING BETTER, BUT IS CONCERNED ABOUT STOPPING PAIN MEDS, DID HAVE A FEVER LAST PM. WAS ALSO SAID TO HAVE HAD A SEIZURE LAST PM. NO NAUSEA OR VOMITING   Diet:  General      Medications:  Scheduled Meds:       ??? phenytoin  100 mg Oral BID   ??? venlafaxine  150 mg Oral Daily   ??? sodium chloride (PF)  10 mL Intravenous Q12H [facility]   ??? sucralfate  1 g Oral 4x Daily AC & HS   ??? pantoprazole  40 mg Intravenous Daily    And   ??? sodium chloride (PF)  10 mL Intravenous Daily   ??? enoxaparin (LOVENOX) injection  40 mg Subcutaneous Q12H SCH     Continuous Infusions:   PRN Meds:acetaminophen, butalbital-acetaminophen-caffeine, temazepam, sodium chloride (PF), diazepam, ondansetron, ondansetron, HYDROmorphone, diphenhydrAMINE    Objective:    CBC:   Recent Labs   Basename 04/02/11 0541 03/31/11 2210    WBC 11.9* 9.8    HGB 14.6 14.5    PLT 230 211     BMP:  Recent Labs   Basename 04/01/11 0511 03/31/11 2215 03/31/11 2210    NA 142 -- 144    K 3.6 -- 3.8    CL 109 -- 110    CO2 28 -- 27    BUN 7 -- 7    CREATININE 0.8 -- 0.8    GLUCOSE 104 101 106     Calcium:  Recent Labs   Basename 04/01/11 0511    CALCIUM 9.0     Ionized Calcium:No results found for this basename: IONCA in the last 72 hours  Magnesium:No results found for this basename: MG in the last 72 hours  Phosphorus:No results found for this basename: PHOS in the last 72 hours  BNP:No results found for this basename: BNP in the last 72 hours  Glucose:No results found for this basename: POCGLU:3 in the last 72 hours  HgbA1C: No results found for this basename: LABA1C in the last 72 hours  INR: No results found for this basename: INR in the last 72 hours  Hepatic: No results found for this basename: ALKPHOS,ALT,AST,PROT,BILITOT,BILIDIR,LABALBU in the last 72 hours  Amylase and Lipase:  Recent Labs   Basename  03/31/11 2300    LACTA 1.5    AMYLASE --     Lactic Acid:   Recent Labs   Basename 03/31/11 2300    LACTA 1.5     Troponin: Recent Labs   Basename 03/31/11 2210    CKTOTAL 80    CKMB --    TROPONINI <0.006     BNP: No results found for this basename: BNP:3 in the last 72 hours  Lipids: No results found for this basename: CHOL,TRIG,HDL,LDL,LDLCALC in the last 72 hours  ABGs: Lab Results   Component Value Date    PHART 7.40 01/11/2011    HCO3ART 26 01/11/2011       Physical Exam:  Vitals: BP 135/69   Pulse 98   Temp(Src) 99 ??F (37.2 ??C) (Oral)   Resp 18   Ht 5' 2.01" (1.575 m)   Wt 239 lb 3.2 oz (108.5 kg)   BMI 43.74 kg/m2   SpO2 96%   Breastfeeding? No  24 hour intake/output:  Intake/Output Summary (Last 24 hours) at 04/02/11 1504  Last data filed at 04/02/11 1400   Gross per 24 hour   Intake    700 ml   Output      0 ml   Net    700 ml     Last 3 weights:  Wt Readings from Last 3 Encounters:   04/02/11 239 lb 3.2 oz (108.5 kg)         General appearance - alert, well appearing, and in no distress  Chest - clear to auscultation, no wheezes, rales or rhonchi, symmetric air entry  Distant Breath Sounds: No  Heart - normal rate, regular rhythm, normal S1, S2, no murmurs, rubs, clicks or gallops  Abdomen - soft, nontender, nondistended, no masses or organomegaly  Obese: No; Protuberant: No   Neurological - alert, oriented, normal speech, no focal findings or movement disorder noted, no nuchal rigidity  Musculoskeletal - no joint tenderness, deformity or swelling  Extremities - peripheral pulses normal, no pedal edema, no clubbing or cyanosis      Assessment:    Active Problems:   Seizure   Head ache   Nausea and vomiting   Confusion   Obesity   Diarrhea   Cancer of kidney   PTSD (post-traumatic stress disorder)          Plan:  1.MRI results are neg  2. Wean narcotics  3. May need an LP, if symptoms don't improve  4  Blood cultures  See Orders.    Joyell Emami T. Vale Mousseau, MD  Rounding Hospitalist

## 2011-04-02 NOTE — Progress Notes (Signed)
Pt temp 101.8 called dr order received

## 2011-04-03 LAB — CBC
Hematocrit: 41.2 % (ref 37.0–47.0)
Hemoglobin: 14.3 gm/dl (ref 12.0–16.0)
MCH: 32.2 pg — ABNORMAL HIGH (ref 27.0–31.0)
MCHC: 34.7 gm/dl (ref 33.0–37.0)
MCV: 92.8 fL (ref 81.0–99.0)
MPV: 8.9 mcm (ref 7.4–10.4)
Platelets: 193 10*3/uL (ref 130–400)
RBC: 4.45 10*6/uL (ref 4.20–5.40)
RDW: 15 % — ABNORMAL HIGH (ref 11.5–14.5)
WBC: 8.6 10*3/uL (ref 4.8–10.8)

## 2011-04-03 LAB — EKG 12-LEAD
Atrial Rate: 88 {beats}/min
P Axis: 48 degrees
P-R Interval: 132 ms
Q-T Interval: 368 ms
QRS Duration: 76 ms
QTc Calculation (Bazett): 445 ms
R Axis: 6 degrees
T Axis: 14 degrees
Ventricular Rate: 88 {beats}/min

## 2011-04-03 MED ORDER — OXYCODONE-ACETAMINOPHEN 5-325 MG PO TABS
5-325 MG | ORAL_TABLET | ORAL | Status: AC | PRN
Start: 2011-04-03 — End: 2011-04-10

## 2011-04-03 MED ORDER — LORAZEPAM 0.5 MG PO TABS
0.5 MG | ORAL_TABLET | Freq: Four times a day (QID) | ORAL | Status: AC | PRN
Start: 2011-04-03 — End: 2011-04-10

## 2011-04-03 MED ORDER — ACETAMINOPHEN 650 MG RE SUPP
650 MG | RECTAL | Status: DC | PRN
Start: 2011-04-03 — End: 2011-06-15

## 2011-04-03 MED ADMIN — sodium chloride (PF) 0.9 % injection 10 mL: INTRAVENOUS | @ 14:00:00

## 2011-04-03 MED ADMIN — sucralfate (CARAFATE) 1 GM/10ML suspension 1 g: ORAL | @ 11:00:00 | NDC 66689079001

## 2011-04-03 MED ADMIN — venlafaxine (EFFEXOR-XR) XR capsule 150 mg: ORAL | @ 14:00:00 | NDC 68382003606

## 2011-04-03 MED ADMIN — diazepam (VALIUM) injection 5 mg: INTRAVENOUS | @ 05:00:00 | NDC 00409127332

## 2011-04-03 MED ADMIN — sucralfate (CARAFATE) 1 GM/10ML suspension 1 g: ORAL | @ 01:00:00 | NDC 66689079001

## 2011-04-03 MED ADMIN — phenytoin (DILANTIN) ER capsule 100 mg: ORAL | @ 14:00:00 | NDC 51672411101

## 2011-04-03 MED ADMIN — diazepam (VALIUM) injection 5 mg: INTRAVENOUS | @ 01:00:00 | NDC 00409127332

## 2011-04-03 MED ADMIN — pantoprazole (PROTONIX) injection 40 mg: INTRAVENOUS | @ 10:00:00 | NDC 00008092351

## 2011-04-03 MED ADMIN — diphenhydrAMINE (BENADRYL) injection 25 mg: INTRAVENOUS | @ 05:00:00 | NDC 00641037621

## 2011-04-03 MED ADMIN — diphenhydrAMINE (BENADRYL) injection 25 mg: INTRAVENOUS | @ 14:00:00 | NDC 00641037621

## 2011-04-03 MED ADMIN — 0.9%  NaCl infusion: INTRAVENOUS | @ 04:00:00 | NDC 00338004904

## 2011-04-03 MED ADMIN — diazepam (VALIUM) injection 5 mg: INTRAVENOUS | @ 09:00:00 | NDC 00409127332

## 2011-04-03 MED ADMIN — enoxaparin (LOVENOX) injection 40 mg: SUBCUTANEOUS | @ 01:00:00 | NDC 00075062040

## 2011-04-03 MED ADMIN — diazepam (VALIUM) injection 5 mg: INTRAVENOUS | @ 14:00:00 | NDC 00409127332

## 2011-04-03 MED ADMIN — phenytoin (DILANTIN) ER capsule 100 mg: ORAL | @ 01:00:00 | NDC 51672411101

## 2011-04-03 MED ADMIN — enoxaparin (LOVENOX) injection 40 mg: SUBCUTANEOUS | @ 14:00:00 | NDC 00075062040

## 2011-04-03 MED ADMIN — diazepam (VALIUM) injection 5 mg: INTRAVENOUS | @ 18:00:00 | NDC 00409127332

## 2011-04-03 MED ADMIN — sodium chloride (PF) 0.9 % injection 10 mL: INTRAVENOUS | @ 02:00:00

## 2011-04-03 MED ADMIN — butalbital-acetaminophen-caffeine (FIORICET, ESGIC) per tablet 1 tablet: 1 | ORAL | @ 01:00:00 | NDC 68084039611

## 2011-04-03 MED ADMIN — butalbital-acetaminophen-caffeine (FIORICET, ESGIC) per tablet 1 tablet: 1 | ORAL | @ 20:00:00 | NDC 68084039611

## 2011-04-03 MED ADMIN — butalbital-acetaminophen-caffeine (FIORICET, ESGIC) per tablet 1 tablet: 1 | ORAL | @ 05:00:00 | NDC 68084039611

## 2011-04-03 MED ADMIN — butalbital-acetaminophen-caffeine (FIORICET, ESGIC) per tablet 1 tablet: 1 | ORAL | @ 09:00:00 | NDC 68084039611

## 2011-04-03 MED ADMIN — HYDROmorphone (DILAUDID) injection 2 mg: 2 mg | INTRAVENOUS | @ 07:00:00 | NDC 00409128331

## 2011-04-03 MED ADMIN — HYDROmorphone (DILAUDID) injection 2 mg: 2 mg | INTRAVENOUS | @ 03:00:00 | NDC 00409128331

## 2011-04-03 MED ADMIN — HYDROmorphone (DILAUDID) injection 2 mg: 2 mg | INTRAVENOUS | @ 11:00:00 | NDC 00409128331

## 2011-04-03 MED ADMIN — HYDROmorphone (DILAUDID) injection 2 mg: 2 mg | INTRAVENOUS | @ 15:00:00 | NDC 00409128331

## 2011-04-03 MED FILL — DIAZEPAM 5 MG/ML IJ SOLN: 5 mg/mL | INTRAMUSCULAR | Qty: 2

## 2011-04-03 MED FILL — HYDROMORPHONE HCL 1 MG/ML IJ SOLN: 1 MG/ML | INTRAMUSCULAR | Qty: 2

## 2011-04-03 MED FILL — DIPHENHYDRAMINE HCL 50 MG/ML IJ SOLN: 50 mg/mL | INTRAMUSCULAR | Qty: 1

## 2011-04-03 MED FILL — DIPHENHYDRAMINE HCL 50 MG/ML IJ SOLN: 50 MG/ML | INTRAMUSCULAR | Qty: 1

## 2011-04-03 MED FILL — ONDANSETRON HCL 4 MG/2ML IJ SOLN: 4 MG/2ML | INTRAMUSCULAR | Qty: 2

## 2011-04-03 MED FILL — DIAZEPAM 5 MG/ML IJ SOLN: 5 MG/ML | INTRAMUSCULAR | Qty: 2

## 2011-04-03 MED FILL — OXYCODONE-ACETAMINOPHEN 5-325 MG PO TABS: 5-325 MG | ORAL | Qty: 1

## 2011-04-03 NOTE — Progress Notes (Signed)
EEG is complete, patient states that she is now ready to leave.  I explained that Dr Cristy Friedlander is waiting for the results from Dr Valentina Shaggy before she will discharge.

## 2011-04-03 NOTE — Progress Notes (Signed)
Inquired as to the complaint made last night and was told that the charge nurse is following through with [facility].

## 2011-04-03 NOTE — Plan of Care (Signed)
Problem: Falls - Risk of  Goal: Absence of falls  Outcome: Ongoing  Call light left within reach. Bed alarm on. Patient uses call light appropriately.

## 2011-04-03 NOTE — Progress Notes (Signed)
Pt. Remains on 4A03 with plans for discharge to home today after EEG completion. Dilantin level 8.0, patient takes Dilantin twice daily. Patient has been receiving Firocet PO prn and Diluadid q 4 hr prn for her headache which she rated a 10 throughout the evening. CBC within normal limits. No other labs or scans to report. Pt. Is also on Valium IV for her " seizure activity ". VSS. Pt. Remains on IV fluids 0.9 NS @ 75 ml/hr. Awaiting physician to round.

## 2011-04-03 NOTE — Progress Notes (Signed)
Husband at bedside.  Patient states she is just going to leave and have her husband take her to [facility].  Educated on the importance of completing treatment.  States "No one is doing anything to find the source of my headaches here, I need some answers"  Notified that I would call Dr Cristy Friedlander and [place].

## 2011-04-03 NOTE — Progress Notes (Signed)
Hospitalist Progress Note    Patient:  Theresa Hobbs  Date of Birth: 10/30/1965    MRN: [id number]     Acct: [id number]     Admit date: 04/01/2011    Subjective: pt seen, very tearful this morning because her pain medication may be stopped. She feels that her headache is now unbearable. Also C/O of pains in her rt antecubital area, from an infilterated IV site. 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 She states that she has been told that they are caused by her PTSD disorder, and that she follows closely with her psychiatrist and also at the [facility] hospital.        Diet:  General      Medications:  Scheduled Meds:     ??? phenytoin  100 mg Oral BID   ??? venlafaxine  150 mg Oral Daily   ??? sodium chloride (PF)  10 mL Intravenous Q12H [facility]   ??? sucralfate  1 g Oral 4x Daily AC & HS   ??? pantoprazole  40 mg Intravenous Daily    And   ??? sodium chloride (PF)  10 mL Intravenous Daily   ??? enoxaparin (LOVENOX) injection  40 mg Subcutaneous Q12H SCH     Continuous Infusions:   PRN Meds:acetaminophen, LORazepam, butalbital-acetaminophen-caffeine, temazepam, sodium chloride (PF), diazepam, ondansetron, ondansetron    Objective:    CBC:   Recent Labs   [facility] 04/03/11 0540 04/02/11 0541 03/31/11 2210    WBC 8.6 11.9* 9.8    HGB 14.3 14.6 14.5    PLT 193 230 211     BMP:  Recent Labs   Basename 04/01/11 0511 03/31/11 2215 03/31/11 2210    NA 142 -- 144    K 3.6 -- 3.8    CL 109 -- 110    CO2 28 -- 27    BUN 7 -- 7    CREATININE 0.8 -- 0.8    GLUCOSE 104 101 106     Calcium:  Recent Labs   Basename 04/01/11 0511    CALCIUM 9.0     Ionized Calcium:No results found for this basename: IONCA in the last 72 hours  Magnesium:No results found for this basename: MG in the last 72 hours  Phosphorus:No results found for this basename: PHOS in the last 72 hours  BNP:No results found for this basename: BNP in the last 72 hours  Glucose:No results found for this basename: POCGLU:3 in the last 72 hours  HgbA1C: No results  found for this basename: LABA1C in the last 72 hours  INR: No results found for this basename: INR in the last 72 hours  Hepatic: No results found for this basename: ALKPHOS,ALT,AST,PROT,BILITOT,BILIDIR,LABALBU in the last 72 hours  Amylase and Lipase:  Recent Labs   Basename 03/31/11 2300    LACTA 1.5    AMYLASE --     Lactic Acid:   Recent Labs   Basename 03/31/11 2300    LACTA 1.5     Troponin: Recent Labs   Basename 03/31/11 2210    CKTOTAL 80    CKMB --    TROPONINI &lt;0.006     BNP: No results found for this basename: BNP:3 in the last 72 hours  Lipids: No results found for this basename: CHOL,TRIG,HDL,LDL,LDLCALC in the last 72 hours  ABGs: Lab Results   Component Value Date    PHART 7.40 01/11/2011    HCO3ART 26 01/11/2011  Physical Exam:  Vitals: BP 136/89   Pulse 104   Temp(Src) 98.7 ??F (37.1 ??C) (Oral)   Resp 20   Ht 5' 2.01" (1.575 m)   Wt 239 lb 3.2 oz (108.5 kg)   BMI 43.74 kg/m2   SpO2 96%   Breastfeeding? No  24 hour intake/output:  Intake/Output Summary (Last 24 hours) at 04/03/11 1114  Last data filed at 04/03/11 0600   Gross per 24 hour   Intake   1875 ml   Output      0 ml   Net   1875 ml     Last 3 weights:  Wt Readings from Last 3 Encounters:   04/02/11 239 lb 3.2 oz (108.5 kg)         General appearance - alert, well appearing, and in no distress, oriented to person, place, and time and anxious  Chest - clear to auscultation, no wheezes, rales or rhonchi, symmetric air entry  Distant Breath Sounds: No  Heart - normal rate, regular rhythm, normal S1, S2, no murmurs, rubs, clicks or gallops  Abdomen - soft, nontender, nondistended, no masses or organomegaly  Obese: Yes; Protuberant: No   Neurological - alert, oriented, normal speech, no focal findings or movement disorder noted  Musculoskeletal - no joint tenderness, deformity or swelling  Extremities - peripheral pulses normal, no pedal edema, no clubbing or cyanosis      Assessment:    Active Problems:   Seizure   Head ache    pseudoseizures/Seizure, await EEG today   Nausea and vomiting   Confusion   Obesity   Diarrhea   Cancer of kidney, currently undergoing Chemo   PTSD (post-traumatic stress disorder)    If EEG IS NORMAL, WILL PLAN HOME TODAY              Orlando Thalmann T. Mirenda Baltazar, MD  Rounding Hospitalist

## 2011-04-03 NOTE — Procedures (Signed)
[facility]                         ELECTROENCEPHALOGRAPHY LAB (EEG)                              [address]                                 [place], [place] [address]                                  [phone]                                       EEG REPORT     PATIENT NAME: Theresa Hobbs, Theresa Hobbs.  DATE: 04/03/2011  MEDICAL RECORD NO: [id number]  ROOM NO: [id number]  ACCOUNT NO: [id number]  REFERRING DOCTOR: Ma Hillock. Briea Mcenery, M.D.     CLINICAL HISTORY:  This is a 45 year old female with spells, evaluated for  possible seizure.  The patient is on antiepileptic medications.     MEDICATIONS: Tylenol, Fioricet, Valium, Lovenox, Zofran, Percocet, Dilantin,  Protonix, Effexor, Restoril, and Carafate.     This is a 17-channel EEG performed without sleep deprivation.  Hyperventilation and photic stimulation were not performed.  The patient is  described as tearful during the recording.     The background rhythm activity is noted to be 9-10 Hz in the posterior  parietal area.  Symmetric, attenuates with eye opening.  The patient is noted  to be tearful during parts of the recording.  Frequent artifact is seen  during the recording limiting the quality of the data obtained.  There was no  evidence of epileptiform activity appreciated throughout this recording.  Hyperventilation and photic stimulation were not performed.  There was no  evidence of epileptiform activity appreciated throughout this recording.  EKG  tracing revealed regular heart rate during the recording.  Fast beta activity  was seen during the recording that may represent mild cortical dysfunction  such as seen with metabolic causes, medication effects (example  benzodiazepines) or nonspecific encephalopathies.     IMPRESSION:  This is a technically limited study due to the abundance of  artifact.  However it appears to be mildly abnormal in the interpretable  parts of the recording due to presence of fast beta activity,  suggestive of  mild cortical dysfunction such as seen with metabolic causes, medication  effects (example benzodiazepines) or nonspecific encephalopathies.  However,  there was no evidence of epileptiform activity appreciated throughout this  recording.                 Ma Hillock. Gee Habig, M.D.        D: 04/03/2011 14:42                                    T: 04/03/2011 14:59  ts     CC:  Ma Hillock. Dong Nimmons, M.D.               Nkoli T. Anigbogu, M.D.  [address]  [place], [place] [address]                        [address]                                        [place], [place] [address]

## 2011-04-03 NOTE — Progress Notes (Addendum)
Awake, alert, appears comfortable despite complaints of headache.  Pupils equal and reacting at 3mm  EOM's and visual fields intact  Moves all extremities  Sensation and coordination intact  MRI negative  EEG pending.  Filed Vitals:    04/03/11 0800   BP: 136/89   Pulse: 104   Temp: 98.7 ??F (37.1 ??C)   Resp: 20     Lab Results   Component Value Date    WBC 8.6 04/03/2011    HGB 14.3 04/03/2011    HCT 41.2 04/03/2011    MCV 92.8 04/03/2011    PLT 193 04/03/2011     Complains of headache  Fever last evening  EEG pending  Requesting dilaudid of pain  Theresa Hobbs Pulling, CNS

## 2011-04-03 NOTE — Progress Notes (Signed)
INT removed and discharge instructions reviewed with patient and husband.  States "I am going to [place] from here anyway to see if they can find out why I have these headaches but before I leave Dr Cristy Friedlander said I could have a dose of dilaudid."  Informed patient that there was no order for that so I could not give it to her but I could give Fiorcet.  Agrees to do so.  Fiorcet given.

## 2011-04-03 NOTE — Progress Notes (Signed)
Patient put on call light and asked for Pain medication and Benadryl. While in the room patient stated that she felt like she was going to have a seizure. While in room patient observed shaking from head to toe. Patient able to squeeze hand on command. Patient also able to cry and mumble. 2 minutes after episode patient asked for sherbert and said she felt ok. Will continue to monitor this patient.

## 2011-04-03 NOTE — Progress Notes (Signed)
Complains of pain at IV site.  Swollen, red and hard.  IV capped off and INT removed.  Patient crying that she did not give any of her dilaudid from 0700 because of the infiltration.  Educated that I can't give that until IV is restarted. Verbalized understanding.

## 2011-04-04 NOTE — Procedures (Signed)
[facility]                         ELECTROENCEPHALOGRAPHY LAB (EEG)                              [address]                                 [place], [place] [address]                                  [phone]                                       EEG REPORT     PATIENT NAME: Theresa Hobbs, Theresa Hobbs.  DATE: 04/03/2011  MEDICAL RECORD NO: [id number]  ROOM NO: [id number]  ACCOUNT NO: [id number]  REFERRING DOCTOR: Ma Hillock. Coron Rossano, M.D.     CLINICAL HISTORY:  This is a 45 year old female with seizure like activity,  altered mental status.     PAST MEDICAL HISTORY:  Significant for obesity, kidney cancer, post-traumatic  stress disorder.  The patient also has memory loss.     MEDICATIONS:  Are Lovenox, Dilantin, Carafate, Effexor, Protonix, Dilaudid,  Valium.     This is a 17-channel EEG performed without sleep deprivation.  Hyperventilation was not performed as the patient was crying.  Photic  stimulation was not performed.  The patient is described as tearful though  alert during the recording.     The background rhythm activity is noted to be 10 Hz in the posterior parietal  area, symmetric, well-modulated, attenuates with eye opening.  The patient is  noted to be crying during parts of the recording.  Hyperventilation was not  performed.  Photic stimulation was not performed.  Fast beta activity was  seen during the recording that may represent mild cortical dysfunction such  as seen with metabolic causes, medication effects (example benzodiazepines)  or nonspecific encephalopathies.  There was no evidence of epileptiform  activity appreciated throughout this recording.     IMPRESSION:  This is a mildly abnormal EEG due to the presence of fast beta  activity suggestive of mild cortical dysfunction such as seen with metabolic  causes, medication effects ((example benzodiazepines) or nonspecific  encephalopathies.  However, there was no evidence of epileptiform activity  appreciated  throughout this recording.           Ma Hillock. Hollye Pritt, M.D.        D: 04/04/2011 12:38                                    T: 04/04/2011 13:54  ks     CC:  Ma Hillock. Chiquita Heckert, M.D.               Eyob A. Julian Hy, M.D.  [address]                [address]  [place], [place] [address]  [address]                                        [place], [place] [address]     Nkoli T. Anigbogu, M.D.  [address]  [address]  [place], [place] [address]

## 2011-04-06 NOTE — Telephone Encounter (Signed)
Patient discharged from [facility] on 7/30. Do you want to see her for a

## 2011-04-06 NOTE — Telephone Encounter (Signed)
ERROR

## 2011-04-08 NOTE — ED Notes (Signed)
Dr Renita Papa into room to assess pt    Sanda Linger, RN  04/08/11 2203

## 2011-04-08 NOTE — ED Notes (Signed)
Pt refuses to stay before d/c instructions could be provided, pt and friend in room immediately left without receiving any type of instruction    Sanda Linger, RN  04/08/11 2219

## 2011-04-08 NOTE — Discharge Instructions (Signed)
Epilepsy  A seizure (convulsion) is a sudden change in brain function that causes a change in behavior, muscle activity, or ability to remain awake and alert. If a person has recurring seizures, this is called epilepsy.  CAUSES  Epilepsy is a disorder with many possible causes. Anything that disturbs the normal pattern of brain cell activity can lead to seizures. Seizure can be caused from illness to brain damage to abnormal brain development. Epilepsy may develop because of:   An abnormality in brain wiring.    An imbalance of nerve signaling chemicals (neurotransmitters).    Some combination of these factors.   Scientists are learning an increasing amount about genetic causes of seizures.  SYMPTOMS   The symptoms of a seizure can vary greatly from one person to another. These may include:   An aura, or warning that tells a person they are about to have a seizure.    Abnormal sensations, such as abnormal smell or seeing flashing lights.    Sudden, general body stiffness.    Rhythmic jerking of the face, arm, or leg - on one or both sides.    Sudden change in consciousness.    The person may appear to be awake but not responding.    They may appear to be asleep but cannot be awakened.    Grimacing, chewing, lip smacking, or drooling.    Often there is a period of sleepiness after a seizure.   DIAGNOSIS   The description you give to your caregiver about what you experienced will help them understand your problems. Equally important is the description by any witnesses to your seizure. A physical exam, including a detailed neurological exam, is necessary. An EEG (electroencephalogram) is a painless test of your brain waves. In this test a diagram is created of your brain waves. These diagrams can be interpreted by a specialist. Pictures of your brain are usually taken with:   An MRI.    A CT scan.   Lab tests may be done to look for:   Signs of infection.    Abnormal blood chemistry.   PREVENTION  There  is no way to prevent the development of epilepsy. If you have seizures that are typically triggered by an event (such as flashing lights), try to avoid the trigger. This can help you avoid a seizure.   PROGNOSIS   Most people with epilepsy lead outwardly normal lives. While epilepsy cannot currently be cured, for some people it does eventually go away. Most seizures do not cause brain damage. It is not uncommon for people with epilepsy, especially children, to develop behavioral and emotional problems. These problems are sometimes the consequence of medicine for seizures or social stress. For some people with epilepsy, the risk of seizures restricts their independence and recreational activities. For example, some states refuse drivers licenses to people with epilepsy.  Most women with epilepsy can become pregnant. They should discuss their epilepsy and the medicine they are taking with their caregivers. Women with epilepsy have a 32 percent or better chance of having a normal, healthy baby.  RISKS AND COMPLICATIONS  People with epilepsy are at increased risk of falls, accidents, and injuries. People with epilepsy are at special risk for two life-threatening conditions. These are status epilepticus and sudden unexplained death (extremely rare). Status epilepticus is a long lasting, continuous seizure that is a medical emergency.  TREATMENT  Once epilepsy is diagnosed, it is important to begin treatment as soon as possible. For about 80 percent  of those diagnosed with epilepsy, seizures can be controlled with modern medicines and surgical techniques. Some antiepileptic drugs can interfere with the effectiveness of oral contraceptives. In 1997, the FDA approved a pacemaker for the brain the (vagus nerve stimulator). This stimulator can be used for people with seizures that are not well-controlled by medicine. Studies have shown that in some cases, children may experience fewer seizures if they maintain a strict diet.  The strict diet is called the ketogenic diet. This diet is rich in fats and low in carbohydrates.  HOME CARE INSTRUCTIONS   Your caregiver will make recommendations about driving and safety in normal activities. Follow these carefully.    Take any medicine prescribed exactly as directed.    Do any blood tests requested to monitor the levels of your medicine.    The people you live and work with should know that you are prone to seizures. They should receive instructions on how to help you. In general, a witness to a seizure should:    Cushion your head and body.    Turn you on your side.    Avoid unnecessarily restraining you.    Not place anything inside your mouth.    Call for local emergency medical help if there is any question about what has occurred.    Keep a seizure diary. Record what you recall about any seizure, especially any possible trigger.    If your caregiver has given you a follow-up appointment, it is very important to keep that appointment. Not keeping the appointment could result in permanent injury and disability. If there is any problem keeping the appointment, you must call back to this facility for assistance.   SEEK MEDICAL CARE IF:   You develop signs of infection or other illness. This might increase the risk of a seizure.    You seem to be having more frequent seizures.    Your seizure pattern is changing.   SEEK IMMEDIATE MEDICAL CARE IF:   A seizure does not stop after a few moments.    A seizure causes any difficulty in breathing.    A seizure results in a very severe headache.    A seizure leaves you with the inability to speak or use a part of your body.   MAKE SURE YOU:    Understand these instructions.    Will watch your condition.    Will get help right away if you are not doing well or get worse.   Document Released: 08/21/2005 Document Re-Released: 02/08/2010  [facility] Patient Information 2012 [place].

## 2011-04-08 NOTE — ED Provider Notes (Signed)
[facility]  eMERGENCY dEPARTMENT eNCOUnter          [place]       Chief Complaint   Patient presents with   . Seizures       Nurses Notes reviewed and I agree except as noted in the HPI.      HISTORY OF PRESENT ILLNESS    Theresa Hobbs is a 45 y.o. female who presents the patient presents with seizure-like activity.  The patient has had multiple evaluations for seizures.  These had been seen by a doctor both Dr. Cornelius Moras as well as Dr. 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  The patient goes to both [facility] in here for evaluations.  She also is has frequent headaches.  She was just admitted to this hospital for further evaluation.  July 28 through April 03, 2011.  She had a head CT that was normal as well as a MRI that was normal.  She had normal labs.  She has a history of bipolar illness.  Today she was said that she had a seizure.  She was brought in via car and she moved her head from side to side and rhythmic fashion with eyes open and was verbalizing no known old but she was easily distractible.  She's had no fevers or chills.  There's no head trauma.     REVIEW OF SYSTEMS     Review of Systems   Constitutional: Negative for fever, chills and diaphoresis.   HENT: Negative for ear pain, congestion, rhinorrhea and neck pain.    Eyes: Negative for visual disturbance.   Respiratory: Negative for cough, chest tightness, shortness of breath, wheezing and stridor.    Cardiovascular: Negative for chest pain, palpitations and leg swelling.   Gastrointestinal: Negative for nausea, vomiting, abdominal pain, diarrhea, blood in stool and abdominal distention.   Genitourinary: Negative for dysuria and hematuria.   Musculoskeletal: Negative for back pain.   Neurological: Negative for dizziness, syncope, weakness, numbness and headaches.   Psychiatric/Behavioral: Negative for behavioral problems and agitation.   [all other systems reviewed and are negative           PAST MEDICAL  HISTORY    has a past medical history of Cancer; Chronic kidney disease; Psychiatric problem; and Seizures.    SURGICAL HISTORY      has past surgical history that includes Abdomen surgery and Cholecystectomy.    CURRENT MEDICATIONS       Previous Medications    ACETAMINOPHEN (TYLENOL) 650 MG SUPPOSITORY    Place 1 suppository rectally every 4 hours as needed for Pain.    BUTALBITAL-ACETAMINOPHEN-CAFFEINE (FIORICET, ESGIC) PER TABLET    Take 1 tablet by mouth every 4 hours as needed.      DIAZEPAM (VALIUM) 5 MG TABLET    Take 5 mg by mouth every 6 hours as needed.      LORAZEPAM (ATIVAN) 0.5 MG TABLET    Take 0.5 tablets by mouth every 6 hours as needed for Anxiety for 7 days.    ONDANSETRON (ZOFRAN-ODT) 4 MG DISINTEGRATING TABLET    Take 4 mg by mouth every 8 hours as needed.      OXYCODONE-ACETAMINOPHEN (PERCOCET) 5-325 MG PER TABLET    Take 1 tablet by mouth every 4 hours as needed for Pain for 7 days.    PHENYTOIN (DILANTIN) 100 MG ER CAPSULE    Take  by mouth 2 times daily.      TEMAZEPAM (RESTORIL)  30 MG CAPSULE    Take 30 mg by mouth nightly as needed.      VENLAFAXINE (EFFEXOR-XR) 150 MG XR CAPSULE    Take 150 mg by mouth daily.         ALLERGIES     is allergic to augmentin; azithromycin; bactrim; ceftin; ciprofloxacin; diazepam; erythromycin; flagyl; iv dye; klonopin; levaquin; nubain; pcn; sulfa drugs; temazepam; toradol; and vicodin.    FAMILY HISTORY     has no family status information on file.  family history includes Cancer in her father.    SOCIAL HISTORY      reports that she has been smoking.  She does not have any smokeless tobacco history on file. She reports that she does not drink alcohol or use illicit drugs.    PHYSICAL EXAM     INITIAL VITALS:  oral temperature is 98.5 F (36.9 C). Her blood pressure is 144/75 and her pulse is 76. Her respiration is 16 and oxygen saturation is 98%.    Physical Exam   Constitutional: She is oriented to person, place, and time. She appears well-developed and  well-nourished.   HENT:   Head: Normocephalic and atraumatic.   Right Ear: External ear normal.   Left Ear: External ear normal.   Mouth/Throat: Oropharynx is clear and moist. No oropharyngeal exudate.   Eyes: Conjunctivae and EOM are normal. Pupils are equal, round, and reactive to light. No scleral icterus.   Neck: Normal range of motion. Neck supple. No JVD present.   Cardiovascular: Normal rate, regular rhythm, normal heart sounds and intact distal pulses.    No murmur heard.  Pulmonary/Chest: Effort normal and breath sounds normal. No respiratory distress. She has no wheezes. She has no rales. She exhibits no tenderness.   Abdominal: Soft. Bowel sounds are normal. She exhibits no distension and no mass. There is no tenderness. There is no rebound and no guarding.   Musculoskeletal: Normal range of motion. She exhibits no edema and no tenderness.   Lymphadenopathy:     She has no cervical adenopathy.   Neurological: She is alert and oriented to person, place, and time. She has normal strength and normal reflexes. No cranial nerve deficit or sensory deficit.   Skin: Skin is warm and dry. She is not diaphoretic.   Psychiatric: She has a normal mood and affect. Her behavior is normal.         DIFFERENTIAL DIAGNOSIS:   Seizure versus pseudoseizure.    DIAGNOSTIC RESULTS       LABS:     Labs Reviewed   CBC WITH AUTO DIFFERENTIAL   COMPREHENSIVE METABOLIC PANEL   MAGNESIUM   POCT GLUCOSE   PHENYTOIN LEVEL, TOTAL   ANION GAP   GLOMERULAR FILTRATION RATE, ESTIMATED   OSMOLALITY       EMERGENCY DEPARTMENT COURSE:   Vitals:    Filed Vitals:    04/08/11 2150   BP: 144/75   Pulse: 76   Temp: 98.5 F (36.9 C)   TempSrc: Oral   Resp: 16   SpO2: 98%     Evaluation  The patient desired to go home after an initial evaluation.  She's had a complete metabolic workup neurologic evaluation as well as having MRIs.    CRITICAL CARE:   None        CONSULTS:  None    PROCEDURES:  None    FINAL IMPRESSION      1. Pseudoseizure  DISPOSITION/PLAN   Home    PATIENT REFERRED TO:  Janeann Forehand  [address]  [place] [address]  [phone]          DISCHARGE MEDICATIONS:  New Prescriptions    No medications on file       (Please note that portions of this note were completed with a voice recognition program.  Efforts were made to edit the dictations but occasionally words are mis-transcribed.)    Felipe Drone, MD            Valorie Roosevelt, MD  04/08/11 2211

## 2011-04-08 NOTE — ED Notes (Signed)
Pt also left without signing d/c intructions    Sanda Linger, RN  04/08/11 2219

## 2011-04-09 ENCOUNTER — Inpatient Hospital Stay
Admit: 2011-04-09 | Discharge: 2011-04-09 | Disposition: A | Discharge status: Home / Self Care | Attending: Emergency Medicine

## 2011-04-09 LAB — POCT GLUCOSE: POC Glucose: 92 mg/dl (ref 70–108)

## 2011-04-27 ENCOUNTER — Inpatient Hospital Stay
Admit: 2011-04-27 | Discharge: 2011-04-27 | Disposition: A | Discharge status: Home / Self Care | Attending: Emergency Medicine

## 2011-04-27 LAB — GLOMERULAR FILTRATION RATE, ESTIMATED: Est, Glom Filt Rate: >90 mL/min/{1.73_m2}

## 2011-04-27 LAB — PHENYTOIN LEVEL, TOTAL: Phenytoin Lvl: <0.5 ug/mL — ABNORMAL LOW (ref 6.0–18.0)

## 2011-04-27 LAB — CBC WITH AUTO DIFFERENTIAL
Basophils: 0.7 % (ref 0.0–3.0)
Eosinophils: 2.1 % (ref 0.0–4.0)
Hematocrit: 41.7 % (ref 37.0–47.0)
Hemoglobin: 14.4 gm/dl (ref 12.0–16.0)
Lymphocytes: 36.3 % (ref 15.0–47.0)
MCH: 31.7 pg — ABNORMAL HIGH (ref 27.0–31.0)
MCHC: 34.6 gm/dl (ref 33.0–37.0)
MCV: 91.7 fL (ref 81.0–99.0)
MPV: 9.6 mcm (ref 7.4–10.4)
Monocytes: 6.1 % (ref 0.0–12.0)
Platelets: 190 10*3/uL (ref 130–400)
RBC Morphology: NORMAL
RBC: 4.55 10*6/uL (ref 4.20–5.40)
RDW: 14.4 % (ref 11.5–14.5)
Seg Neutrophils: 54.8 % (ref 43.0–75.0)
WBC: 10.7 10*3/uL (ref 4.8–10.8)
nRBC: 0 /100 wbc

## 2011-04-27 LAB — BASIC METABOLIC PANEL
BUN: 8 mg/dl (ref 7–22)
CO2: 30 meq/l (ref 23–33)
Calcium: 9.3 mg/dl (ref 8.5–10.5)
Chloride: 106 meq/l (ref 98–111)
Creatinine: 0.7 mg/dl (ref 0.4–1.2)
Glucose: 89 mg/dl (ref 70–108)
Potassium: 3.7 meq/l (ref 3.5–5.2)
Sodium: 143 meq/l (ref 135–145)

## 2011-04-27 LAB — OSMOLALITY: Osmolality Calc: 282.8 mOsmol/kg (ref 275.0–300.0)

## 2011-04-27 LAB — HCG, SERUM, QUALITATIVE: Preg, Serum: NEGATIVE

## 2011-04-27 LAB — ANION GAP: Anion Gap: 10.7 (ref 10.0–20.0)

## 2011-04-27 MED ORDER — DICYCLOMINE HCL 20 MG PO TABS
20 MG | ORAL_TABLET | Freq: Four times a day (QID) | ORAL | Status: DC
Start: 2011-04-27 — End: 2013-01-21

## 2011-04-27 MED ORDER — PHENYTOIN SODIUM EXTENDED 100 MG PO CAPS
100 MG | ORAL_CAPSULE | Freq: Two times a day (BID) | ORAL | Status: DC
Start: 2011-04-27 — End: 2013-01-21

## 2011-04-27 MED ORDER — ONDANSETRON 4 MG PO TBDP
4 | ORAL_TABLET | Freq: Three times a day (TID) | ORAL | Status: DC | PRN
Start: 2011-04-27 — End: 2013-03-11

## 2011-04-27 MED ADMIN — diphenhydrAMINE (BENADRYL) injection 25 mg: INTRAVENOUS | @ 09:00:00 | NDC 00641037621

## 2011-04-27 MED ADMIN — LORazepam (ATIVAN) injection 1 mg: INTRAVENOUS | @ 09:00:00 | NDC 00641604401

## 2011-04-27 MED ADMIN — metoclopramide (REGLAN) injection 10 mg: INTRAVENOUS | @ 09:00:00 | NDC 00409341401

## 2011-04-27 MED ADMIN — sodium chloride 0.9% bolus: INTRAVENOUS | @ 09:00:00 | NDC 00338004904

## 2011-04-27 MED ADMIN — HYDROmorphone (DILAUDID) injection 1 mg: 1 mg | INTRAVENOUS | @ 10:00:00 | NDC 00409128331

## 2011-04-27 MED FILL — LORAZEPAM 2 MG/ML IJ SOLN: 2 mg/mL | INTRAMUSCULAR | Qty: 1

## 2011-04-27 MED FILL — HYDROMORPHONE HCL 1 MG/ML IJ SOLN: 1 MG/ML | INTRAMUSCULAR | Qty: 1

## 2011-04-27 MED FILL — PHENYTOIN SODIUM EXTENDED 100 MG PO CAPS: 100 MG | ORAL | Qty: 4

## 2011-04-27 MED FILL — METOCLOPRAMIDE HCL 5 MG/ML IJ SOLN: 5 mg/mL | INTRAMUSCULAR | Qty: 2

## 2011-04-27 MED FILL — DIPHENHYDRAMINE HCL 50 MG/ML IJ SOLN: 50 mg/mL | INTRAMUSCULAR | Qty: 1

## 2011-04-27 NOTE — ED Notes (Signed)
IV established and blood drawn and sent    Emilie Rutter, RN  04/27/11 [phone]

## 2011-04-27 NOTE — ED Notes (Signed)
Upon entering room to give dilantin patient refusing to swallow pills. States she is nauseated. Patient received nausea medication prior and has not vomitted since arriving to the ED. MD aware. Ordered to hold the dose and discharge the patient.     Emilie Rutter, RN  04/27/11 [phone]

## 2011-04-27 NOTE — ED Notes (Signed)
Pt to room 17. Complains of seizures, headache and nausea. Started last Friday. Pt states she is afraid her "dilantin levels are low". Secondary assessment completed. Vitals stable. Side rails up times 2. Call light in reach.     Linna Caprice, RN  04/27/11 [phone]

## 2011-04-27 NOTE — Discharge Instructions (Signed)
Medication Refill, Emergency Department  We have refilled your medication today as a courtesy to you. It is best for your medical care, however, to take care of getting refills done through your primary caregiver's office. They have your records and can do a better job of follow-up than we can in the emergency department.  On maintenance medications, we often only prescribe enough medications to get you by until you are able to see your regular caregiver. This is a more expensive way to refill medications.  In the future, please plan for refills so that you will not have to use the emergency department for this.  Thank you for your help. Your help allows [place] to better take care of the daily emergencies that enter our department.  Document Released: 12/08/2003   [facility] Patient Information 2012 [place].      B.R.A.T. Diet  Your doctor has recommended the B.R.A.T. diet for you or your child until the condition improves. This is often used to help control diarrhea and vomiting symptoms. If you or your child can tolerate clear liquids, you may have:   Bananas.    Rice.    Applesauce.    Toast (and other simple starches such as crackers, potatoes, noodles).   Be sure to avoid dairy products, meats, and fatty foods until symptoms are better. Fruit juices such as apple, grape, and prune juice can make diarrhea worse. Avoid these. Continue this diet for 2 days or as instructed by your caregiver.  Document Released: 08/21/2005 Document Re-Released: 06/18/2007  [facility] Patient Information 2012 [place].      Nausea and Vomiting  Nausea is a sick feeling that often comes before throwing up (vomiting). Vomiting is a reflex where stomach contents come out of your mouth. Vomiting can cause severe loss of body fluids (dehydration). Children and elderly adults dehydrate quickly, especially if they also have diarrhea.  CAUSES   Upsetting smells, sounds, or sights.   Motion sickness.    Pregnancy.    Breathing  in smoke or bad fumes.    Head injuries.   Infections.   Food poisoning.    Alcohol.    Belly (abdominal) problems.    Migraine headaches.    HOME CARE INSTRUCTIONS   Take all medicine as directed by your caregiver.    If you do not have an appetite, do not force yourself to eat.    If you have an appetite, eat a normal diet unless your caregiver tells you differently.    Eat a variety of complex carbohydrates (rice, wheat, potatoes, bread), lean meats, yogurt, fruits, and vegetables.    Avoid high fat foods because they are more difficult to digest.    Fluids are less likely to cause nausea. They can also prevent dehydration.    Watch for signs of dehydration:    Severe thirst.   Dry lips and mouth.    Dizziness.    Dark urine.   Decreasing urine frequency and amount.   Confusion.    Rapid breathing or pulse.     If any of the above signs of dehydration are present, an oral rehydration solution (ORS) should be started as directed by your caregiver. A more rapid treatment may be necessary for the elderly.   SEEK IMMEDIATE MEDICAL CARE IF:   You have blood or brown flecks (like coffee grounds) in your vomit.    You have a severe headache or stiff neck.    You are confused.    You  have severe abdominal pain.    You do not urinate every 8 hours.    You or your child has an oral temperature above 102 F (38.9 C), not controlled by medicine.    Your baby is older than 3 months with a rectal temperature of 102 F (38.9 C) or higher.    Your baby is 54 months old or younger with a rectal temperature of 100.4 F (38 C) or higher.   MAKE SURE YOU:   Understand these instructions.    Will watch your condition.    Will get help right away if you are not doing well or get worse.   Document Released: 11/15/2009   [facility] Patient Information 2012 [place].

## 2011-04-27 NOTE — ED Notes (Signed)
Patient requesting a script for nausea medication and fioricet. MD aware. Patient written for nausea medication and explained to patient she needs to follow up with her doctor for headache medication.     Emilie Rutter, RN  04/27/11 [phone]

## 2011-04-27 NOTE — ED Provider Notes (Signed)
[facility]  eMERGENCY dEPARTMENT eNCOUnter          [place]       Chief Complaint   Patient presents with   . Emesis   . Nausea   . Seizures       Nurses Notes reviewed and I agree except as noted in the HPI.      HISTORY OF PRESENT ILLNESS    Theresa Hobbs is a 45 y.o. female who presents states she has had  Vomiting and diarrhea since Friday.  Was seen at [facility] and was given IV fluids and had labs done.  She has had no fever.  She has had headaches of similar quality and severity in the past.  Has not able to keep dilantin down.      REVIEW OF SYSTEMS     Review of Systems   Constitutional: Negative for fever, chills and activity change.   HENT: Negative for ear pain, congestion, sore throat and rhinorrhea.    Eyes: Negative for pain, discharge and itching.   Respiratory: Negative for cough, shortness of breath and wheezing.    Cardiovascular: Negative for chest pain.   Gastrointestinal: Positive for nausea, vomiting and diarrhea. Negative for abdominal pain.   Genitourinary: Negative for dysuria and difficulty urinating.   Musculoskeletal: Negative for myalgias, back pain and arthralgias.   Skin: Negative for color change and rash.   Neurological: Positive for seizures. Negative for dizziness, light-headedness and headaches.   Psychiatric/Behavioral: Negative for suicidal ideas, confusion, self-injury and agitation.   [all other systems reviewed and are negative          PAST MEDICAL HISTORY    has a past medical history of Cancer; Chronic kidney disease; Psychiatric problem; and Seizures.    SURGICAL HISTORY      has past surgical history that includes Abdomen surgery and Cholecystectomy.    CURRENT MEDICATIONS       Previous Medications    ACETAMINOPHEN (TYLENOL) 650 MG SUPPOSITORY    Place 1 suppository rectally every 4 hours as needed for Pain.    BUTALBITAL-ACETAMINOPHEN-CAFFEINE (FIORICET, ESGIC) PER TABLET    Take 1 tablet by mouth every 4 hours as needed.      DIAZEPAM (VALIUM) 5 MG  TABLET    Take 5 mg by mouth every 6 hours as needed.      LEVETIRACETAM (KEPPRA PO)    Take  by mouth.      ONDANSETRON (ZOFRAN-ODT) 4 MG DISINTEGRATING TABLET    Take 4 mg by mouth every 8 hours as needed.      PHENYTOIN (DILANTIN) 100 MG ER CAPSULE    Take  by mouth 2 times daily.      TEMAZEPAM (RESTORIL) 30 MG CAPSULE    Take 30 mg by mouth nightly as needed.      VENLAFAXINE (EFFEXOR-XR) 150 MG XR CAPSULE    Take 150 mg by mouth daily.      ZOLPIDEM (AMBIEN) 10 MG TABLET    Take 10 mg by mouth nightly as needed.         ALLERGIES     is allergic to augmentin; azithromycin; bactrim; ceftin; ciprofloxacin; diazepam; erythromycin; flagyl; iv dye; klonopin; levaquin; nubain; pcn; sulfa drugs; temazepam; toradol; and vicodin.    FAMILY HISTORY     has no family status information on file.  family history includes Cancer in her father.    SOCIAL HISTORY      reports that she has been  smoking.  She does not have any smokeless tobacco history on file. She reports that she does not drink alcohol or use illicit drugs.    PHYSICAL EXAM     INITIAL VITALS:  height is 5' 2"$  (1.575 m) and weight is 194 lb (87.998 kg). Her oral temperature is 98.3 F (36.8 C). Her blood pressure is 128/66 and her pulse is 95. Her respiration is 18 and oxygen saturation is 98%.    Physical Exam   [nursing notereviewed.  Constitutional: She is oriented to person, place, and time. She appears well-developed and well-nourished.   HENT:   Head: Normocephalic and atraumatic.   Right Ear: External ear normal.   Left Ear: External ear normal.   Eyes: Conjunctivae and EOM are normal. Pupils are equal, round, and reactive to light.   Neck: Normal range of motion. Neck supple.   Cardiovascular: Normal rate, regular rhythm and normal heart sounds.    Pulmonary/Chest: Effort normal. No respiratory distress. She has no wheezes.   Abdominal: Soft. Bowel sounds are normal. She exhibits no distension. There is no tenderness. There is no rebound.    Musculoskeletal: Normal range of motion.   Neurological: She is alert and oriented to person, place, and time.   Skin: Skin is warm and dry.   Psychiatric: She has a normal mood and affect. Her behavior is normal.         DIFFERENTIAL DIAGNOSIS:   Reported vomiting diarrhea.  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   Could be dehydrated, electrolyte abnormalities, infectious diarrhea.   Will do basic blood work and treat her pain.      DIAGNOSTIC RESULTS     EKG: All EKG's are interpreted by the Emergency Department Physician who either signs or Co-signs this chart in the absence of a cardiologist.      RADIOLOGY: non-plain film images(s) such as CT, Ultrasound and MRI are read by the radiologist.  None      LABS:   Labs Reviewed   CBC WITH AUTO DIFFERENTIAL - Abnormal; Notable for the following:     MCH 31.7 (*)     All other components within normal limits   PHENYTOIN LEVEL, TOTAL - Abnormal; Notable for the following:     Phenytoin Lvl <0.5 (*)     All other components within normal limits   BASIC METABOLIC PANEL   HCG, SERUM, QUALITATIVE   ANION GAP   GLOMERULAR FILTRATION RATE, ESTIMATED   OSMOLALITY       EMERGENCY DEPARTMENT COURSE:   Vitals:    Filed Vitals:    04/27/11 0400 04/27/11 0430 04/27/11 0448 04/27/11 0614   BP:   143/76 128/66   Pulse:   93 95   Temp:       TempSrc:       Resp:   18 18   Height:       Weight:       SpO2: 99% 100% 100% 98%     Patient was seen history physical exam was performed.   Patient was given Reglan and Ativan and Benadryl IV push, she stated this did not help her pain, she was given one dose of Dilaudid 1 mg IV push.  Patient was found to be sleeping, but stated her pain was still a 10 out of 10. Theresa Del, PA-C, went in to discuss her discharge, as well as her blood work, patient insisted On speaking to Dr. Gilles Hobbs.   I went in to  review her results, she asked for her dilantin level, which I noted to be negative.  Patient was told she would be given  a loading dose, but she refused to take the pills when brought in by the nurse.   Patient does not appear ill or toxic.  She has had no episodes of nausea, vomiting, or diarrhea in the emergency department.   She is awake and talking despite pain of a "10".    CRITICAL CARE:   None    CONSULTS:  None    PROCEDURES:  None    FINAL IMPRESSION      1. Encounter for medication refill    2. Nausea vomiting and diarrhea          DISPOSITION/PLAN   Discharge, dilantin written.   Instructed to call PCP today.     PATIENT REFERRED TO:  Janeann Forehand  [place] [address]  [phone]    in 1 day      DISCHARGE MEDICATIONS:  New Prescriptions    DICYCLOMINE (BENTYL) 20 MG TABLET    Take 1 tablet by mouth every 6 hours.    ONDANSETRON (ZOFRAN ODT) 4 MG DISINTEGRATING TABLET    Take 1 tablet by mouth every 8 hours as needed for Nausea for 15 doses.    PHENYTOIN (DILANTIN) 100 MG ER CAPSULE    Take 3 capsules by mouth 2 times daily. Take today ONLY, then resume 100 mg TID as prescribed.       (Please note that portions of this note were completed with a voice recognition program.  Efforts were made to edit the dictations but occasionally words are mis-transcribed.)  Physician Note:    I have personally performed and/or participated in the history, exam and medical decision making and agree with all pertinent clinical information.  I have also reviewed and agree with the past medical, family and social history unless otherwise noted.      Evaluation:  As above.   Note was edited by myself.      Amil Amen, MD                  Amil Amen, MD            Amil Amen, MD  04/27/11 0700

## 2011-04-27 NOTE — ED Notes (Addendum)
Theresa Hobbs in to speak with patient about results and impending discharge. Patient asleep upon entering.  Patient states she wants to talk to a real doctor and that her pain is 10/10. Dr Jenkins Rouge aware.       Emilie Rutter, RN  04/27/11 1610    Emilie Rutter, RN  04/27/11 [phone]

## 2011-04-27 NOTE — ED Notes (Signed)
Dr Gilles Chiquito in to see patient. Dr Gilles Chiquito explained that labs are normal and there is no reason to admit patient. States dilantin level is low and will give loading dose before discharge. Patient also states she has a 10/10 headache. Dr explained we have given a lot for her pain already.     Lavonia Drafts, RN  04/27/11 [phone]

## 2011-04-27 NOTE — ED Notes (Signed)
Lights dimmed. Call light in reach. Side rails up X 2. Family at bedside.     Lavonia Drafts, RN  04/27/11 [phone]

## 2011-04-27 NOTE — ED Notes (Signed)
Patient states her pain is still 10/10 after medication. MD aware. Side rails up X 2. Call light in reach. Family at bedside. Patient tearful.     Emilie Rutter, RN  04/27/11 [phone]

## 2011-06-05 ENCOUNTER — Inpatient Hospital Stay: Admit: 2011-06-05 | Discharge: 2011-06-05 | Disposition: A | Discharge status: Home / Self Care

## 2011-06-05 MED ORDER — HYDROCODONE-ACETAMINOPHEN 5-500 MG PO TABS
5-500 MG | ORAL_TABLET | Freq: Three times a day (TID) | ORAL | Status: DC | PRN
Start: 2011-06-05 — End: 2013-01-21

## 2011-06-05 MED ADMIN — acetaminophen (TYLENOL) tablet 650 mg: ORAL | @ 20:00:00 | NDC 63739044001

## 2011-06-05 MED FILL — ACETAMINOPHEN 325 MG PO TABS: 325 mg | ORAL | Qty: 2
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
## 2011-06-05 NOTE — Discharge Instructions (Signed)
Bruise, Contusion, Hematoma  A bruise (contusion) or hematoma is a collection of blood under skin causing an area of discoloration. It is caused by an injury to blood vessels beneath the injured area with a release of blood into that area. As blood accumulates it is known as a hematoma. This collection of blood causes a blue to dark blue color. As the injury improves over days to weeks it turns to a yellowish color and then usually disappears completely over the same period of time. These generally resolve completely without problems. The hematoma rarely requires drainage.  HOME CARE INSTRUCTIONS   Apply ice to the injured area for 20 minutes four times per day for the first 1 or 2 days.    Put the ice in a plastic bag and place a towel between the bag of ice and your skin. Discontinue the ice if it causes pain.    If bleeding is more than just a little, apply pressure to the area for at least thirty minutes to decrease the amount of bruising. Apply pressure and ice as your caregiver suggests.    If the injury is on an extremity, elevation of that part may help to decrease pain and swelling. Wrapping with an ace or supportive wrap may also be helpful. If the bruise is on a lower extremity and is painful, crutches may be helpful for a couple days.    If you have been given a tetanus shot because the skin was broken, your arm may get swollen, red and warm to touch at the shot site. This is a normal response to the medicine in the shot. If you did not receive a tetanus shot today because you did not recall when your last one was given, make sure to check with your caregiver's office and determine if one is needed. Generally for a "dirty" wound, you should receive a tetanus booster if you have not had one in the last five years. If you have a "clean" wound, you should receive a tetanus booster if you have not had one within the last ten years.   SEEK MEDICAL CARE IF:   You have pain not controlled with over the  counter medications. Only take over-the-counter or prescription medicines for pain, discomfort, or fever as directed by your caregiver. Do not use aspirin as it may cause bleeding.    You develop increasing pain or swelling in the area of injury.    An oral temperature above 102 F (38.9 C) develops, or as your caregiver suggests.    You develop any problems which seem worse than the problems which brought you in.   SEEK IMMEDIATE MEDICAL CARE IF:   You develop severe pain in the area of the bruise out of proportion to the initial injury.    The bruised area becomes red, tender, and swollen.   MAKE SURE YOU:    Understand these instructions.    Will watch your condition.    Will get help right away if you are not doing well or get worse.   Document Released: 05/31/2005 Document Re-Released: 11/17/2008  [employer] Patient Information 2012 [employer].

## 2011-06-15 ENCOUNTER — Inpatient Hospital Stay: Admit: 2011-06-15 | Discharge: 2011-06-15 | Disposition: A | Discharge status: Home / Self Care

## 2011-06-15 MED ORDER — HYDROCODONE-ACETAMINOPHEN 5-500 MG PO TABS
5-500 MG | ORAL_TABLET | Freq: Four times a day (QID) | ORAL | Status: AC | PRN
Start: 2011-06-15 — End: 2011-06-17

## 2011-06-15 NOTE — ED Provider Notes (Signed)
The history is provided by the patient and the spouse.   45 y.o.female present with pain in rt dorsal foot, dropped a large piece of wood on dorsal foot with open shoes on when they were cutting wood.     Review of Systems   Constitutional: Positive for activity change. Negative for fever and fatigue.   Musculoskeletal: Positive for myalgias, joint swelling and gait problem.   All other systems reviewed and are negative.        Physical Exam   Nursing note and vitals reviewed.  Constitutional: She is oriented to person, place, and time. Vital signs are normal. She appears well-developed and well-nourished. She is cooperative.   HENT:   Head: Normocephalic and atraumatic.   Right Ear: Hearing normal.   Left Ear: Hearing normal.   Eyes: Lids are normal.   Neck: Trachea normal, normal range of motion and phonation normal.   Cardiovascular: Normal rate, regular rhythm, S1 normal, S2 normal, intact distal pulses and normal pulses.    Pulses:       Dorsalis pedis pulses are 2+ on the right side, and 2+ on the left side.        Posterior tibial pulses are 2+ on the right side, and 2+ on the left side.   Pulmonary/Chest: Effort normal.   Abdominal: Soft. Normal appearance.   Musculoskeletal: Normal range of motion. She exhibits tenderness.        Left foot: She exhibits tenderness and swelling. She exhibits normal range of motion, normal capillary refill, no crepitus, no deformity and no laceration.        Feet:    Neurological: She is alert and oriented to person, place, and time. She has normal strength. No sensory deficit. Gait (d/t pain) abnormal.        Touch and sensation intact rt foot     Skin: Skin is warm, dry and intact. There is erythema. No cyanosis.   Psychiatric: Her mood appears anxious. Her speech is rapid and/or pressured.       Procedures    MDM    Labs      Radiology    Wet read per radiology-naf    EKG Interpretation.        Silas Flood. Dmani Mizer, CNP  06/15/11 1920

## 2011-06-15 NOTE — Discharge Instructions (Signed)
Bruise, Contusion, Hematoma  A bruise (contusion) or hematoma is a collection of blood under skin causing an area of discoloration. It is caused by an injury to blood vessels beneath the injured area with a release of blood into that area. As blood accumulates it is known as a hematoma. This collection of blood causes a blue to dark blue color. As the injury improves over days to weeks it turns to a yellowish color and then usually disappears completely over the same period of time. These generally resolve completely without problems. The hematoma rarely requires drainage.  HOME CARE INSTRUCTIONS   Apply ice to the injured area for 20 minutes four times per day for the first 1 or 2 days.    Put the ice in a plastic bag and place a towel between the bag of ice and your skin. Discontinue the ice if it causes pain.    If bleeding is more than just a little, apply pressure to the area for at least thirty minutes to decrease the amount of bruising. Apply pressure and ice as your caregiver suggests.    If the injury is on an extremity, elevation of that part may help to decrease pain and swelling. Wrapping with an ace or supportive wrap may also be helpful. If the bruise is on a lower extremity and is painful, crutches may be helpful for a couple days.    If you have been given a tetanus shot because the skin was broken, your arm may get swollen, red and warm to touch at the shot site. This is a normal response to the medicine in the shot. If you did not receive a tetanus shot today because you did not recall when your last one was given, make sure to check with your caregiver's office and determine if one is needed. Generally for a "dirty" wound, you should receive a tetanus booster if you have not had one in the last five years. If you have a "clean" wound, you should receive a tetanus booster if you have not had one within the last ten years.   SEEK MEDICAL CARE IF:   You have pain not controlled with over the  counter medications. Only take over-the-counter or prescription medicines for pain, discomfort, or fever as directed by your caregiver. Do not use aspirin as it may cause bleeding.    You develop increasing pain or swelling in the area of injury.    An oral temperature above 102 F (38.9 C) develops, or as your caregiver suggests.    You develop any problems which seem worse than the problems which brought you in.   SEEK IMMEDIATE MEDICAL CARE IF:   You develop severe pain in the area of the bruise out of proportion to the initial injury.    The bruised area becomes red, tender, and swollen.   MAKE SURE YOU:    Understand these instructions.    Will watch your condition.    Will get help right away if you are not doing well or get worse.   Document Released: 05/31/2005 Document Re-Released: 11/17/2008  [employer] Patient Information 2012 [employer].

## 2011-06-15 NOTE — ED Notes (Signed)
4 inch ace wrap applied to right ankle. Verbalized understanding of circulation checks.    Sidonie Dickens, LPN  16/10/96 0454
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
## 2011-06-15 NOTE — ED Notes (Signed)
Pt returns from xray.    Simonne Come, RN  06/15/11 1851

## 2011-10-02 ENCOUNTER — Inpatient Hospital Stay
Admit: 2011-10-02 | Discharge: 2011-10-03 | Disposition: A | Discharge status: Home / Self Care | Attending: Emergency Medicine

## 2011-10-02 LAB — POCT GLUCOSE
Glucose: 94 mg/dL
POC Glucose: 94 mg/dl (ref 70–108)

## 2011-10-02 LAB — CBC WITH AUTO DIFFERENTIAL
Basophils: 0.8 % (ref 0.0–3.0)
Eosinophils: 1.6 % (ref 0.0–4.0)
Hematocrit: 46.2 % (ref 37.0–47.0)
Hemoglobin: 15.5 gm/dl (ref 12.0–16.0)
Lymphocytes: 36.1 % (ref 15.0–47.0)
MCH: 31 pg (ref 27.0–31.0)
MCHC: 33.7 gm/dl (ref 33.0–37.0)
MCV: 92 fL (ref 81.0–99.0)
MPV: 10.3 mcm (ref 7.4–10.4)
Monocytes: 5 % (ref 0.0–12.0)
Platelets: 193 10*3/uL (ref 130–400)
RBC Morphology: NORMAL
RBC: 5.02 10*6/uL (ref 4.20–5.40)
RDW: 15.3 % — ABNORMAL HIGH (ref 11.5–14.5)
Seg Neutrophils: 56.5 % (ref 43.0–75.0)
WBC: 8.1 10*3/uL (ref 4.8–10.8)
nRBC: 0 /100 wbc

## 2011-10-02 LAB — HEPATIC FUNCTION PANEL
ALT: 26 U/L (ref 11–66)
AST: 35 U/L (ref 12–45)
Albumin: 4.3 gm/dl (ref 3.5–5.1)
Alkaline Phosphatase: 107 U/L (ref 38–126)
Bilirubin, Direct: 0.1 mg/dl (ref 0.0–0.3)
Total Bilirubin: 0.3 mg/dl (ref 0.3–1.2)
Total Protein: 6.8 gm/dl (ref 6.1–8.0)

## 2011-10-02 LAB — URINALYSIS WITH REFLEX TO CULTURE
Bilirubin Urine: NEGATIVE
Blood, Urine: NEGATIVE
Glucose, Ur: NEGATIVE mg/dl
Ketones, Urine: NEGATIVE
Leukocyte Esterase, Urine: NEGATIVE
Nitrite, Urine: NEGATIVE
Protein, UA: NEGATIVE
Specific Gravity, Urine: 1.02 (ref 1.002–1.030)
Urobilinogen, Urine: 1 eu/dl (ref 0.0–1.0)
pH, UA: 7.5 (ref 5.0–9.0)

## 2011-10-02 LAB — BASIC METABOLIC PANEL
BUN: 9 mg/dl (ref 7–22)
CO2: 30 meq/l (ref 23–33)
Calcium: 9.1 mg/dl (ref 8.5–10.5)
Chloride: 109 meq/l (ref 98–111)
Creatinine: 0.9 mg/dl (ref 0.4–1.2)
Glucose: 92 mg/dl (ref 70–108)
Potassium: 3.7 meq/l (ref 3.5–5.2)
Sodium: 142 meq/l (ref 135–145)

## 2011-10-02 LAB — LIPASE: Lipase: 17 U/L (ref 5.6–51.3)

## 2011-10-02 LAB — GLOMERULAR FILTRATION RATE, ESTIMATED: Est, Glom Filt Rate: 67 mL/min/{1.73_m2} — AB

## 2011-10-02 LAB — ANION GAP: Anion Gap: 6.7 — ABNORMAL LOW (ref 10.0–20.0)

## 2011-10-02 LAB — PHENYTOIN LEVEL, TOTAL: Phenytoin Lvl: <0.5 ug/mL — ABNORMAL LOW (ref 6.0–18.0)

## 2011-10-02 LAB — OSMOLALITY: Osmolality Calc: 281.4 mOsmol/kg (ref 275.0–300.0)

## 2011-10-02 MED ADMIN — 0.9 % sodium chloride infusion: INTRAVENOUS | @ 23:00:00 | NDC 00338004904

## 2011-10-02 MED ADMIN — morphine injection 2 mg: INTRAMUSCULAR | @ 23:00:00 | NDC 00409189001

## 2011-10-02 MED FILL — PROMETHAZINE HCL 25 MG/ML IJ SOLN: 25 MG/ML | INTRAMUSCULAR | Qty: 1

## 2011-10-02 MED FILL — PHENYTOIN SODIUM EXTENDED 100 MG PO CAPS: 100 MG | ORAL | Qty: 3

## 2011-10-02 MED FILL — MORPHINE SULFATE 2 MG/ML IJ SOLN: 2 mg/mL | INTRAMUSCULAR | Qty: 1

## 2011-10-02 NOTE — ED Notes (Signed)
Pt returned from Ct Scan.     Pixie Casino, RN  10/02/11 [phone]

## 2011-10-02 NOTE — ED Notes (Signed)
Pt tolerated phenergan shot. Pt asked nurse to unhook IV and push Morphine in straight, nurse did not and continued to push morphine in saline line to dilute medication. Pt continuously complained that she could not feel the medication and that it was not going in. Discharge reviewed. VS stable. Pt wheeled to discharge lobby and helped to private car with husband driving.     Pixie Casino, RN  10/02/11 1954

## 2011-10-02 NOTE — Discharge Instructions (Signed)
Abdominal Pain  Many things can cause belly (abdominal) pain. Most times, the belly pain is not dangerous. The amount of belly pain does not tell how serious the problem may be. Many cases of belly pain can be watched and treated at home. At this time, your doctor believes it is safe for you or your child to go home.  HOME CARE   Do not take medicines called laxatives unless told to do so by your doctor. This medicine makes a person go poop (bowel movement).    Only use medicines as told by your doctor.    Limit activity.    Pay attention to the pain.    Has it changed?    Has it moved?    Is it gone?    Eat or drink as told by your doctor. Your doctor will tell you if you or your child should be on a special diet.    Ask questions if there is something you do not understand.   GET HELP RIGHT AWAY IF:   The pain does not go away.    The pain changes and is only in the right or left part of the belly.    You or your child has a temperature by mouth above 102 F (38.9 C), not controlled by medicine.    Your baby is older than 3 months with a rectal temperature of 102 F (38.9 C) or higher.    Your baby is 72 months old or younger with a rectal temperature of 100.4 F (38 C) or higher.    You or your child keeps throwing up (vomiting) or cannot keep food and fluids down.    There is blood (bright red color) in the poop.    The poop is black and/or looks like tar.   MAKE SURE YOU:   Understand these instructions.    Will watch this condition.    Will get help right away if you or your child is not doing well or gets worse.   Document Released: 02/07/2008 Document Re-Released: 11/15/2009  [facility] Patient Information 2012 [place].    Non-epileptic Seizures  Non-epileptic seizures can look just like epilepsy or seizures. They can even occur in people who also have a seizure disorder. The difference is that there is no brain wave abnormalities during a non-epileptic seizure. Even though  non-epileptic seizures originate in the mind, they are real to the person having the episode. They can be frightening and cause the patient to feel out of control.  Non-epileptic seizures happen most often in women. They are often caused by an emotional crisis or other stresses. A diagnosis may require an EEG test. Treatment can include both medications and counseling. Call your caregiver as directed to arrange for further evaluation and proper treatment.  Do not drive, ride a bike, or operate dangerous equipment until your caregiver says it is safe.  Document Released: 09/28/2004 Document Re-Released: 02/07/2008  [facility] Patient Information 2012 [place].

## 2011-10-02 NOTE — ED Notes (Signed)
Pt taken to CT.     Pixie Casino, RN  10/02/11 1705

## 2011-10-02 NOTE — ED Provider Notes (Signed)
[facility]  eMERGENCY dEPARTMENT eNCOUnter          CHIEF COMPLAINT       Chief Complaint   Patient presents with   ??? Seizures     was in outpatient [place] began to have seizure activity, that involved staring off to the right and mumbling       Nurses Notes reviewed and I agree except as noted in the HPI.      HISTORY OF PRESENT ILLNESS    Theresa Hobbs is a 46 y.o. female who presents to the ED for seizure during a renal US. She was reported unresponsive but when she arrived she was very nervous and she was alert and oriented. PMH positive for pseudoseizure and seizure. No fever or chills. She said she has been sick for days. She complains headache 5/10. She has chronic abdominal pain. No dysuria or urinary frequency. She has been having diarrhea for several weeks which is watery.     REVIEW OF SYSTEMS     Review of Systems   Constitutional: Positive for chills and fatigue. Negative for fever, activity change and appetite change.   HENT: Negative for hearing loss, nosebleeds, congestion, rhinorrhea, neck pain, neck stiffness and ear discharge.    Eyes: Negative for discharge and itching.   Respiratory: Negative for cough, shortness of breath and wheezing.    Cardiovascular: Negative for chest pain.   Gastrointestinal: Positive for nausea and abdominal pain. Negative for vomiting, diarrhea and abdominal distention.   Endocrine: Negative for cold intolerance.   Genitourinary: Negative for urgency, hematuria and flank pain.   Musculoskeletal: Negative for myalgias and arthralgias.   Skin: Negative for rash and wound.   Neurological: Positive for dizziness and seizures. Negative for weakness.   Psychiatric/Behavioral: Positive for behavioral problems and confusion. Negative for suicidal ideas and agitation.         PAST MEDICAL HISTORY    has a past medical history of Cancer; Chronic kidney disease; Psychiatric problem; Seizures; Lupus; and Depression.    SURGICAL HISTORY      has past surgical history  that includes Abdomen surgery; Cholecystectomy; and Nose surgery.    CURRENT MEDICATIONS       Previous Medications    BUTALBITAL-ACETAMINOPHEN-CAFFEINE (FIORICET, ESGIC) PER TABLET    Take 1 tablet by mouth every 4 hours as needed.      CYCLOPHOSPHAMIDE (CYTOXAN IJ)    Inject  as directed.      DIAZEPAM (VALIUM) 5 MG TABLET    Take 5 mg by mouth every 6 hours as needed.      DICYCLOMINE (BENTYL) 20 MG TABLET    Take 1 tablet by mouth every 6 hours.    DIPHENOXYLATE-ATROPINE (LOMOTIL PO)    Take 2 mg by mouth 2 times daily as needed.    HYDROCODONE-ACETAMINOPHEN (CO-GESIC) 5-500 MG PER TABLET    Take 1 tablet by mouth every 8 hours as needed for Pain.    LEVETIRACETAM (KEPPRA PO)    Take 500 mg by mouth 2 times daily.    ONDANSETRON (ZOFRAN ODT) 4 MG DISINTEGRATING TABLET    Take 1 tablet by mouth every 8 hours as needed for Nausea for 15 doses.    ONDANSETRON (ZOFRAN-ODT) 4 MG DISINTEGRATING TABLET    Take 4 mg by mouth every 8 hours as needed.      PHENYTOIN (DILANTIN) 100 MG ER CAPSULE    Take 200 mg by mouth 3 times daily.    PHENYTOIN (  DILANTIN) 100 MG ER CAPSULE    Take 3 capsules by mouth 2 times daily. Take today ONLY, then resume 100 mg TID as prescribed.    TEMAZEPAM (RESTORIL) 30 MG CAPSULE    Take 30 mg by mouth nightly as needed.      UNABLE TO FIND    Chemo pill pt not sure of name     VENLAFAXINE (EFFEXOR-XR) 150 MG XR CAPSULE    Take 300 mg by mouth daily.    ZOLPIDEM (AMBIEN) 10 MG TABLET    Take 10 mg by mouth nightly as needed.       ALLERGIES     is allergic to augmentin; azithromycin; bactrim; ceftin; ciprofloxacin; erythromycin; flagyl; imitrex; iv dye; klonopin; levaquin; nubain; other; pcn; sulfa drugs; toradol; and tramadol.    FAMILY HISTORY     has no family status information on file.  family history includes Cancer in her father.    SOCIAL HISTORY      reports that she has been smoking.  She does not have any smokeless tobacco history on file. She reports that she does not drink alcohol  or use illicit drugs.    PHYSICAL EXAM     INITIAL VITALS:  oral temperature is 98 ??F (36.7 ??C). Her blood pressure is 122/72 and her pulse is 93. Her respiration is 18 and oxygen saturation is 94%.    Physical Exam   Constitutional: She is oriented to person, place, and time. She appears well-developed and well-nourished.   GCS 15.    HENT:   Head: Normocephalic and atraumatic.   Right Ear: External ear normal.   Left Ear: External ear normal.   Eyes: Pupils are equal, round, and reactive to light. Right eye exhibits no discharge. Left eye exhibits no discharge. No scleral icterus.   Neck: Normal range of motion. Neck supple. No tracheal deviation present.   Cardiovascular: Normal rate, regular rhythm and normal heart sounds.  Exam reveals no gallop and no friction rub.    No murmur heard.  Pulmonary/Chest: Effort normal. No stridor. No respiratory distress. She has no wheezes.   Abdominal: Soft. There is tenderness. There is no rebound and no guarding.       Musculoskeletal: She exhibits no edema and no tenderness.   Neurological: She is alert and oriented to person, place, and time. No cranial nerve deficit. Coordination normal.   Skin: Skin is warm and dry. She is not diaphoretic.   Psychiatric: She has a normal mood and affect. Her behavior is normal. Judgment and thought content normal.   She is nervous.          DIFFERENTIAL DIAGNOSIS:   Seizures, pseudoseizures, chronic abdominal pain, UTI, attention seeking behavior    DIAGNOSTIC RESULTS     EKG: All EKG's are interpreted by the Emergency Department Physician who either signs or Co-signs this chart in the absence of a cardiologist.  Reviewed by ED physician, compared with EKG from 03/31/2011, sinus tachycardia, ventricular rate 103/minute, normal PR interval and QRS duration, normal QT, nonspecific T wave abnormality, no ST segment elevation     RADIOLOGY: non-plain film images(s) such as CT, Ultrasound and MRI are read by the radiologist.  CT of the head  without contrast read by radiologist: No acute intracranial findings  Renal ultrasound reviewed by radiologist: 1 cm right kidney cystic, otherwise unremarkable. No renal stones.  []  Visualized and interpreted by me   [x]  Radiologist's Wet Read Report Reviewed   []  Discussed with  Radiologist.    LABS:   Labs Reviewed   CBC WITH AUTO DIFFERENTIAL - Abnormal; Notable for the following:     RDW 15.3 (*)     All other components within normal limits   PHENYTOIN LEVEL, TOTAL - Abnormal; Notable for the following:     Phenytoin Lvl <0.5 (*)     All other components within normal limits   ANION GAP - Abnormal; Notable for the following:     Anion Gap 6.7 (*)     All other components within normal limits   GLOMERULAR FILTRATION RATE, ESTIMATED - Abnormal; Notable for the following:     Est, Glom Filt Rate 67 (*)     All other components within normal limits   POCT GLUCOSE - Normal   STOOL CULTURE   FECAL LEUKOCYTES   GIARDIA ANTIGEN   C DIFF TOXIN B BY RT PCR   BASIC METABOLIC PANEL   HEPATIC FUNCTION PANEL   URINE RT REFLEX TO CULTURE   LIPASE   OSMOLALITY   BLOOD OCCULT STOOL SCREEN #1   ROTAVIRUS ANTIGEN, STOOL   E. COLI 0157 ANTIGEN       EMERGENCY DEPARTMENT COURSE:   Vitals:    Filed Vitals:    10/02/11 1611 10/02/11 1641 10/02/11 1736 10/02/11 1838   BP: 129/113 130/79 141/95 122/72   Pulse: 105 97 102 93   Temp: 98 ??F (36.7 ??C)      TempSrc: Oral      Resp: 20  18    SpO2: 100% 97% 97% 94%     Patient was evaluated in a timely fashion. She was alert and oriented on arrival. She's not a postictal status. Highly suspicious for a pseudoseizure at this time. Screening labs are reassuring. CT of the head is negative for acute intracranial findings. She has chronic abdominal pain, and she is medicated with morphine and Phenergan with significant improvement of her symptoms. UA is normal. Discharged in stable conditions. She has Tylenol and Phenergan at home. To be followed by her primary care physician in the next 3  days.    CRITICAL CARE:   None    CONSULTS:  None    PROCEDURES:  None    FINAL IMPRESSION      1. Pseudoseizure    2. Chronic abdominal pain          DISPOSITION/PLAN   Discharge home    PATIENT REFERRED TO:  Wolfgang Phoenix, MD  [address]  [place] [address]  [phone]    In 2 days        DISCHARGE MEDICATIONS:  New Prescriptions    No medications on file       (Please note that portions of this note were completed with a voice recognition program.  Efforts were made to edit the dictations but occasionally words are mis-transcribed.)    Burna Cash, MD            Burna Cash, MD  10/02/11 [id number]

## 2011-10-02 NOTE — ED Notes (Signed)
Pt complained of diarrhea, has had no BM since being in ER.     Pixie Casino, RN  10/02/11 1754

## 2011-10-02 NOTE — ED Notes (Signed)
Dr. Aura Camps in room patient continues to request more pain medication and is stuttering. Pt upset with the idea of tylenol. New orders placed.     Pixie Casino, RN  10/02/11 1952

## 2011-10-02 NOTE — ED Notes (Signed)
Medicated patient with morphine. Dr. Aura Camps in room stated would give prescription for phenergan  To be given at home.    Gwenlyn Found, RN  10/02/11 Paulo Fruit

## 2011-10-02 NOTE — Progress Notes (Signed)
Rapid response called on this pt in outpatient  Ultrasound.  O2 set up at 15L Non rebreather mask. Pt transported to ER.

## 2011-10-02 NOTE — ED Notes (Signed)
Straight cath completed.   Dr. Aura Camps in room after patient complains of pain. Pt states that she feels like she "got hit in the head". Pt keeps suttering. Dr. Aura Camps in room. Pt keeps asking to go home because he wont prescribe pain medication. Dr. Aura Camps brought up patients several prescriptions for Vicodin.     Theresa Casino, RN  10/02/11 1751

## 2011-10-02 NOTE — ED Notes (Signed)
Assessment completed. Pt presents to ED from outpatient [place] where she had possible seizure activity. Pt currently opening eyes spontaneously and mumbling. Complains of head hurting. Pt able to answer Dr. Roxana Hires questions. Sating 100 percent on RA. VS stable. History and meds reviewed as able.     Pixie Casino, RN  10/02/11 1627

## 2011-10-02 NOTE — ED Notes (Signed)
Pt on call light to speak with Dr. Aura Camps. Dr. Aura Camps notified.     Pixie Casino, RN  10/02/11 1913

## 2011-10-03 LAB — EKG 12-LEAD
Atrial Rate: 103 {beats}/min
Atrial Rate: 103 {beats}/min
P Axis: 44 degrees
P Axis: 44 degrees
P-R Interval: 138 ms
P-R Interval: 138 ms
Q-T Interval: 356 ms
Q-T Interval: 356 ms
QRS Duration: 78 ms
QRS Duration: 78 ms
QTc Calculation (Bazett): 466 ms
QTc Calculation (Bazett): 466 ms
R Axis: -13 degrees
R Axis: -13 degrees
T Axis: 34 degrees
T Axis: 34 degrees
Ventricular Rate: 103 {beats}/min
Ventricular Rate: 103 {beats}/min

## 2011-10-03 MED ADMIN — promethazine (PHENERGAN) injection 25 mg: INTRAMUSCULAR | @ 01:00:00 | NDC 00641092821

## 2011-10-03 MED ADMIN — phenytoin (DILANTIN) chewable tablet 500 mg: ORAL | @ 01:00:00 | NDC 00071000740

## 2011-10-03 MED ADMIN — morphine injection 2 mg: INTRAMUSCULAR | NDC 00409189001

## 2011-10-03 MED FILL — DILANTIN INFATABS 50 MG PO CHEW: 50 MG | ORAL | Qty: 10

## 2011-10-03 MED FILL — PROMETHAZINE HCL 25 MG/ML IJ SOLN: 25 MG/ML | INTRAMUSCULAR | Qty: 1

## 2011-10-03 MED FILL — MORPHINE SULFATE 2 MG/ML IJ SOLN: 2 mg/mL | INTRAMUSCULAR | Qty: 1

## 2011-12-06 NOTE — Progress Notes (Signed)
Nephrology History and Physical  Patient's Name: Theresa Hobbs  1:00 PM  12/06/2011    Nephrologist:  Ignacia Bayley    Reason for Consult:  Renal mass, flank pain  Requesting Physician:   Wolfgang Phoenix, MD  [address]  [place], [place] [address]  Primary Care Physician  Wolfgang Phoenix, MD     Dear Dr. Lenox Ahr,       I had the pleasure of meeting Theresa Hobbs in my clinic today.  This was her first visit to this clinic.  As you know, she is a pleasant 46 year old white female with a very complicated past medical history, including seizure disorder, lupus, clear-cell renal carcinoma, and depression who has a five month history of severe right flank pain, worsen with movement.  The patient also complains of urge incontinence but no dysuria.  She subsequently underwent a renal US in January 2013 which showed a 1 cm cyst along the posterior aspect of the mid pole of the right kidney.  There was no evidence of renal stones or solid renal masses.  Her renal function is otherwise intact, with a SCr of 0.9.  The patient was referred to Nephrology for further evaluation.    Past Medical History   Diagnosis Date   . Cancer    . Chronic kidney disease    . Psychiatric problem    . Seizures      Pt voiced she has seizures.   . Lupus    . Depression    . Anxiety    . Fibromyalgia    . Post traumatic stress disorder (PTSD)    . Fibromyalgia         PMHx:   Past Medical History   Diagnosis Date   . Cancer    . Chronic kidney disease    . Psychiatric problem    . Seizures      Pt voiced she has seizures.   . Lupus    . Depression    . Anxiety    . Fibromyalgia    . Post traumatic stress disorder (PTSD)    . Fibromyalgia        PSHx:   Past Surgical History   Procedure Laterality Date   . Abdomen surgery     . Cholecystectomy     . Nose surgery         Family History:   Family History   Problem Relation Age of Onset   . Cancer Father        Social History:  reports that she has quit smoking. She has never used smokeless tobacco. She  reports that she does not drink alcohol or use illicit drugs.    Allergies:  Augmentin; Azithromycin; Bactrim; Ceftin; Ciprofloxacin; Erythromycin; Flagyl; Imitrex; Iv dye; Klonopin; Levaquin; Nubain; Other; Pcn; Sulfa drugs; Toradol; and Tramadol    Current Medications:    Current Outpatient Prescriptions   Medication Sig Dispense Refill   . Diphenoxylate-Atropine (LOMOTIL PO) Take 2 mg by mouth 2 times daily as needed.       . zolpidem (AMBIEN) 10 MG tablet Take 10 mg by mouth nightly as needed.       . phenytoin (DILANTIN) 100 MG ER capsule Take 3 capsules by mouth 2 times daily. Take today ONLY, then resume 100 mg TID as prescribed.  30 capsule  0   . venlafaxine (EFFEXOR-XR) 150 MG XR capsule Take 300 mg by mouth daily.       Marland Kitchen  diazepam (VALIUM) 5 MG tablet Take 5 mg by mouth every 6 hours as needed.         . Cyclophosphamide (CYTOXAN IJ) Inject  as directed.         Marland Kitchen UNABLE TO FIND Chemo pill pt not sure of name        . HYDROcodone-acetaminophen (CO-GESIC) 5-500 MG per tablet Take 1 tablet by mouth every 8 hours as needed for Pain.  10 tablet  0   . LevETIRAcetam (KEPPRA PO) Take 500 mg by mouth 2 times daily.       . ondansetron (ZOFRAN ODT) 4 MG disintegrating tablet Take 1 tablet by mouth every 8 hours as needed for Nausea for 15 doses.  15 tablet  0   . dicyclomine (BENTYL) 20 MG tablet Take 1 tablet by mouth every 6 hours.  15 tablet  0   . butalbital-acetaminophen-caffeine (FIORICET, ESGIC) per tablet Take 1 tablet by mouth every 4 hours as needed.         . temazepam (RESTORIL) 30 MG capsule Take 30 mg by mouth nightly as needed.         . ondansetron (ZOFRAN-ODT) 4 MG disintegrating tablet Take 4 mg by mouth every 8 hours as needed.         . phenytoin (DILANTIN) 100 MG ER capsule Take 200 mg by mouth 3 times daily.         No current facility-administered medications for this visit.     Review of Systems:   A comprehensive review of systems was negative except for: 18 lb weight loss in 2 weeks due  to poor appetite and ongoing nausea, double vision, urge incontinence; no fevers or chills.    Physical exam:   Constitutional:  Tearful but NAD  Vitals: BP 122/70  Pulse 68  Temp(Src) 98.5 F (36.9 C)  Resp 20  Wt 252 lb (114.306 kg)  BMI 46.08 kg/m2  Skin: no rash, turgor wnl  Heent:  eomi, mmm  Neck: no bruits or jvd noted  Cardiovascular:  Regular, normal S1, S2 without m/r/g  Respiratory: CTA B without w/r/r  Abdomen:  +bs, soft, nt, nd; +right CVA tenderness, no left CVA tenderness  Ext: 2+ non-pitting lower extremity edema  Psychiatric: tearful, frustrated  Musculoskeletal:  Good Rom, muscular strength intact, 4-5/5, symmetric    Data:   Labs:  Lab Results   Component Value Date    NA 142 10/02/2011    NA 143 04/27/2011    NA 142 04/01/2011    K 3.7 10/02/2011    K 3.7 04/27/2011    K 3.6 04/01/2011    CL 109 10/02/2011    CO2 30 10/02/2011    CO2 30 04/27/2011    CO2 28 04/01/2011    CREATININE 0.9 10/02/2011    CREATININE 0.7 04/27/2011    CREATININE 0.8 04/01/2011    BUN 9 10/02/2011    BUN 8 04/27/2011    BUN 7 04/01/2011    GLUCOSE 92 10/02/2011    GLUCOSE 94 10/02/2011    GLUCOSE 89 04/27/2011    PHOS 5.3* 08/12/2010    WBC 8.1 10/02/2011    WBC 10.7 04/27/2011    WBC 8.6 04/03/2011    HGB 15.5 10/02/2011    HGB 14.4 04/27/2011    HGB 14.3 04/03/2011    HCT 46.2 10/02/2011    HCT 41.7 04/27/2011    HCT 41.2 04/03/2011    MCV 92.0 10/02/2011    PLT 193  10/02/2011     Imaging:  Renal US IMPRESSION:   1. 1 CM CYST ALONG THE MID POLE OF THE RIGHT KIDNEY. UNREMARKABLE   EXAMINATION OF THE KIDNEYS OTHERWISE.   2. NO RENAL STONES, HYDRONEPHROSIS, OR SOLID RENAL MASSES.    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  Her renal function appears intact.  I am unclear regarding the etiology of her flank pain.  There was no evidence of mass or stone on renal US.  She displays no fever/chills consistent with pyelonephritis.  This may be related to the history of renal carcinoma.  It is unlikely lupus would be affecting a single kidney.  She may have instead musculoskeletal pathology.  We will therefore obtain a 24 hour urine collection for protein and check a CXR to assess for any rib fractures.  We will also check a urine reflex culture to determine urinary track infection.  She does have incontinence problems for which Urology may be helpful.  Finally, given the extent of her pain, a comprehensive approach to pain management without the use of chronic NSAIDs seems appropriate.  The patient should return to clinic in 4 weeks.    Thank you Dr.  for allowing [place] to participate in care of TAMRA KOOS.  Please feel free to contact my office if you have any questions.       Ignacia Bayley

## 2012-04-17 ENCOUNTER — Inpatient Hospital Stay
Admit: 2012-04-17 | Discharge: 2012-04-17 | Disposition: A | Discharge status: Home / Self Care | Attending: Emergency Medicine
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
## 2012-04-17 NOTE — Discharge Instructions (Signed)
Knee Sprain  You have a knee sprain. Sprains are painful injuries to the joints. A sprain is a partial or complete tearing of ligaments. Ligaments are tough, fibrous tissues that hold bones together at the joints. A strain (sprain) has occurred when a ligament is stretched or damaged. This injury may take several weeks to heal. This is often the same length of time as a bone fracture (break in bone) takes to heal. Even though a fracture (bone break) may not have occurred, the recovery times may be similar.  HOME CARE INSTRUCTIONS    Rest the injured area for as long as directed by your caregiver. Then slowly start using the joint as directed by your caregiver and as the pain allows. Use crutches as directed. If the knee was splinted or casted, continue use and care as directed. If an ace bandage has been applied today, it should be removed and reapplied every 3 to 4 hours. It should not be applied tightly, but firmly enough to keep swelling down. Watch toes and feet for swelling, bluish discoloration, coldness, numbness or excessive pain. If any of these symptoms occur, remove the ace bandage and reapply more loosely.If these symptoms persist, seek medical attention.   For the first 24 hours, lie down. Keep the injured extremity elevated on two pillows.   Apply ice to the injured area for 15 to 20 minutes every couple hours. Repeat this 3 to 4 times per day for the first 48 hours. Put the ice in a plastic bag and place a towel between the bag of ice and your skin.   Wear any splinting, casting, or elastic bandage applications as instructed.   Only take over-the-counter or prescription medicines for pain, discomfort, or fever as directed by your caregiver. Do not use aspirin immediately after the injury unless instructed by your caregiver. Aspirin can cause increased bleeding and bruising of the tissues.   If you were given crutches, continue to use them as instructed. Do not resume weight bearing on the  affected extremity until instructed.  Persistent pain and inability to use the injured area as directed for more than 2 to 3 days are warning signs. If this happens you should see a caregiver for a follow-up visit as soon as possible. Initially, a hairline fracture (this is the same as a broken bone) may not be evident on x-rays. Persistent pain and swelling indicate that further evaluation, non-weight bearing (use of crutches as instructed), and/or further x-rays are indicated. X-rays may sometimes not show a small fracture until a week or ten days later. Make a follow-up appointment with your own caregiver or one to whom we have referred you. A radiologist (specialist in reading x-rays) may re-read your X-rays. Make sure you know how you are to get your x-ray results. Do not assume everything is normal if you do not hear from us.  SEEK MEDICAL CARE IF:    Bruising, swelling, or pain increases.   You have cold or numb toes   You have continuing difficulty or pain with walking.  SEEK IMMEDIATE MEDICAL CARE IF:    Your toes are cold, numb or blue.   The pain is not responding to medications and continues to stay the same or get worse.  MAKE SURE YOU:    Understand these instructions.   Will watch your condition.   Will get help right away if you are not doing well or get worse.  Document Released: 08/21/2005 Document Revised: 08/10/2011 Document Reviewed: 08/05/2007    [employer] Patient Information 2012 [employer].
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
## 2012-04-17 NOTE — ED Notes (Signed)
Dr. Volney PresserMcTague at bedside.    Jarold SongHeather L Masud Holub, RN  04/17/12 [phone]

## 2013-01-21 MED ORDER — OXYCODONE-ACETAMINOPHEN 5-325 MG PO TABS
5-325 MG | ORAL_TABLET | Freq: Four times a day (QID) | ORAL | Status: DC | PRN
Start: 2013-01-21 — End: 2013-02-03

## 2013-01-21 NOTE — Telephone Encounter (Signed)
Scheduled patient for repair of abdominal wall hernia x 3 on Fri 6/6 at 10:45am. Patient will arrive at 8:45am & she will be npo after midnight. Metformin will be stopped 2 days prior to surgery. Consent was signed. Patient has lab orders from her family physician which she will have done this Thursday at [facility]. An order for an ekg was faxed to he hospital & she will have it done on Thurs.

## 2013-01-21 NOTE — Progress Notes (Signed)
Subjective:      Patient ID: Theresa Hobbs is a 47 y.o. female.  Chief Complaint   Patient presents with   ??? Surgical Consult     self referral right femoral hernia       HPIInteresting 47 year old white female who was in the [employer] for 22 years and apparently came out in 2007. The patient over the last year or so has apparently gained 100 pounds. She has had onset of the last 2 months of pain in the right lower abdomen and has noted a bulge that she clearly states will protrude she reduces. She also had a laparoscopic cholecystectomy performed in the past and is also noted a bulge and protrusion with reduction of her age abdominal trocar site. Because of this pain she went to the emergency room at [facility] had had a CT scan performed. The CT scan makes no mention of the lower abdominal wall hernia but does state there was a small umbilical hernia with fat. Patient is quite animated about her pain . She she does have occasional nausea and vomiting and has diarrhea. She denies radiation of the pain to the back and the pain is persistent    Review of Systems   Constitutional: Positive for activity change, appetite change and fatigue. Negative for fever, chills and unexpected weight change.   HENT: Negative for hearing loss, nosebleeds, trouble swallowing, neck pain, neck stiffness and tinnitus.    Eyes: Negative for photophobia and visual disturbance.   Respiratory: Negative for apnea, cough, chest tightness, shortness of breath and wheezing.    Cardiovascular: Negative for chest pain, palpitations and leg swelling.   Gastrointestinal: Positive for nausea, vomiting, abdominal pain and diarrhea. Negative for constipation, blood in stool and abdominal distention.   Endocrine: Negative for cold intolerance and heat intolerance.   Genitourinary: Negative for urgency, frequency, hematuria, vaginal bleeding, vaginal discharge, difficulty urinating and menstrual problem.        Right groin pain    Musculoskeletal: Negative for back  pain and arthralgias.   Skin: Negative for rash and wound.   Allergic/Immunologic: Negative for environmental allergies and immunocompromised state.   Neurological: Negative for dizziness, tremors, seizures, weakness, light-headedness, numbness and headaches.        Last seizure over 1 year ago    Hematological: Negative for adenopathy. Does not bruise/bleed easily.   Psychiatric/Behavioral: Positive for sleep disturbance. Negative for behavioral problems. The patient is nervous/anxious.      BP 136/84   Pulse 80   Temp(Src) 97.7 ??F (36.5 ??C) (Oral)   Resp 20   Ht 5\' 2"  (1.575 m)   Wt 284 lb 6.4 oz (129.003 kg)   BMI 52 kg/m2   Breastfeeding? No  Past Medical History   Diagnosis Date   ??? Cancer      kidney    ??? Chronic kidney disease    ??? Psychiatric problem    ??? Seizures      Pt voiced she has seizures.   ??? Lupus    ??? Depression    ??? Anxiety    ??? Fibromyalgia    ??? Post traumatic stress disorder (PTSD)    ??? HTN (hypertension)    ??? Diabetes mellitus      type 2        History     Social History   ??? Marital Status: Married     Spouse Name: N/A     Number of Children: N/A   ??? Years of  Education: N/A     Occupational History   ??? Not on file.     Social History Main Topics   ??? Smoking status: Current Every Day Smoker -- 1.00 packs/day for 6 years     Types: Cigarettes   ??? Smokeless tobacco: Never Used   ??? Alcohol Use: No   ??? Drug Use: No   ??? Sexually Active: Not on file     Other Topics Concern   ??? Not on file     Social History Narrative   ??? No narrative on file       Past Surgical History   Procedure Laterality Date   ??? Abdomen surgery     ??? Cholecystectomy     ??? Nose surgery         Allergies   Allergen Reactions   ??? Imitrex (Sumatriptan) Anaphylaxis   ??? Iv Dye (Iodides) Anaphylaxis   ??? Amoxicillin-Pot Clavulanate Hives and Itching   ??? Azithromycin Hives and Itching   ??? Bactrim Hives and Itching   ??? Cefuroxime Axetil Hives and Itching   ??? Ciprofloxacin Hives and Itching   ??? Erythromycin Hives and Itching   ??? Flagyl  (Metronidazole) Hives and Itching   ??? Ketorolac Tromethamine Hives and Itching   ??? Klonopin (Clonazepam) Hives and Itching   ??? Levofloxacin Hives and Itching   ??? Nubain (Nalbuphine Hcl)      blister   ??? Other      Stadol-blisters    ??? Pcn (Penicillins) Hives and Itching   ??? Reglan (Metoclopramide)      Blistering    ??? Sulfa Antibiotics Hives and Itching   ??? Tramadol Hives and Itching       Current outpatient prescriptions:LISINOPRIL PO, Take  by mouth daily., Disp: , Rfl: ;  ARIPiprazole (ABILIFY) 10 MG tablet, Take 10 mg by mouth daily., Disp: , Rfl: ;  METFORMIN HCL PO, Take  by mouth 2 times daily., Disp: , Rfl: ;  LevETIRAcetam (KEPPRA PO), Take 500 mg by mouth 2 times daily., Disp: , Rfl: ;  zolpidem (AMBIEN) 10 MG tablet, Take 10 mg by mouth nightly as needed., Disp: , Rfl:   ondansetron (ZOFRAN ODT) 4 MG disintegrating tablet, Take 1 tablet by mouth every 8 hours as needed for Nausea for 15 doses., Disp: 15 tablet, Rfl: 0;  venlafaxine (EFFEXOR-XR) 150 MG XR capsule, Take 300 mg by mouth daily., Disp: , Rfl: ;  diazepam (VALIUM) 5 MG tablet, Take 5 mg by mouth every 6 hours as needed.  , Disp: , Rfl:   phenytoin (DILANTIN) 100 MG ER capsule, Take 100 mg by mouth 3 times daily. May increase up to 5 100 mg tablets if needed (depending on dilantin level), Disp: , Rfl:     Family History   Problem Relation Age of Onset   ??? Cancer Father      pancreatic    ??? Cancer Mother      uterine and ovarian    ??? Osteoporosis Mother           Objective:   Physical Exam   Constitutional: She is oriented to person, place, and time. She appears well-developed and well-nourished. No distress.   Obese white female 50 2 Bmi   HENT:   Head: Normocephalic and atraumatic.   Eyes: Conjunctivae and EOM are normal. Pupils are equal, round, and reactive to light. Right eye exhibits no discharge. Left eye exhibits no discharge. No scleral icterus.   Neck: Normal range  of motion. Neck supple. No JVD present. No thyromegaly present.    Cardiovascular: Normal rate and regular rhythm.  Exam reveals no gallop and no friction rub.    No murmur heard.  Pulmonary/Chest: Effort normal and breath sounds normal. No stridor. No respiratory distress. She has no wheezes. She exhibits no tenderness.   Abdominal: Soft. Bowel sounds are normal. She exhibits no distension. There is tenderness. There is no rebound and no guarding.       Musculoskeletal: Normal range of motion. She exhibits no edema.   Neurological: She is alert and oriented to person, place, and time.   Skin: Skin is warm and dry. No rash noted. She is not diaphoretic. No erythema. No pallor.   Psychiatric: She has a normal mood and affect. Her behavior is normal. Judgment and thought content normal.       Assessment:      Apparent ventral wall hernia x3. Discussed with the patient that I cannot palpate the hernias due to her size and discomfort she is having. We discussed hernias, the repair using mesh and the potentials for infection. Again she is adamant that a mass protrudes out and reduces. She wants to proceed with repair of all 3       Plan:      1. Schedule Joi for Ventral wall hernia repair x3  2. The risks, benefits and alternatives were discussed with Joi. She understands and wishes to proceed with surgical intervention.  3. Restrictions discussed with Joi and she expresses understanding.  4. She is advised to call back directly if there are further questions/concerns, or if her symptoms worsen prior to surgery.

## 2013-01-23 NOTE — Telephone Encounter (Signed)
Called patient to remind her to have pre-op EKG done.  She plans to have it done at [facility] on Tues 01-28-13.  While on the phone, Joi stated that the prescribed percocet is too strong.  It is causing her to be nauseated.  She even tried taking 1/2 tablet but did not tolerate it well either.  She would prefer Norco if possible.    Spoke to Dr. Pat Patrick about patient's request.  Received the verbal order to call Norco 5/325 mg 1 tab every 6 hours PRN pain to her pharmacy.  20 tabs.  No refill.  Called medication to [facility] in [place].    Notified patient of script. She verbalized understanding and denies any further needs.

## 2013-01-28 NOTE — Telephone Encounter (Signed)
Ok

## 2013-01-30 NOTE — Telephone Encounter (Signed)
Patient called requesting more Vicodin.  States if Dr. Pat Patrick could give her 10 more that should last her until her surgery this Friday.     Does not want Percocet as Percocet gives her upset stomach.    Told her I would talk to Dr. Pat Patrick and someone would get back with her.   She voiced understanding.

## 2013-01-30 NOTE — Telephone Encounter (Signed)
Per Dr. Pat Patrick ok to call in norco 5/325 mg 1 tab every 6 hours prn. 10 tabs. o refills. Called into [facility] [place]. Left on voicemail. Patient made aware.

## 2013-01-31 NOTE — Telephone Encounter (Signed)
Called patient to see if she has had her EKG done.  She plans to come to [facility] on 02-03-13 to have the test done.  Let her know that without the EKG, her surgery would have to be canceled. She verbalized understanding.

## 2013-02-01 MED ORDER — HYDROCODONE-ACETAMINOPHEN 5-325 MG PO TABS
5-325 MG | ORAL_TABLET | Freq: Four times a day (QID) | ORAL | Status: DC | PRN
Start: 2013-02-01 — End: 2013-02-09

## 2013-02-03 LAB — CBC WITH AUTO DIFFERENTIAL
Anisocytosis: 1
Basophils Absolute: 0.1 10*3/uL (ref 0.0–0.1)
Basophils: 0.6 %
Eosinophils Absolute: 0.2 10*3/uL (ref 0.0–0.4)
Eosinophils: 2.3 %
Hematocrit: 43.4 % (ref 37.0–47.0)
Hemoglobin: 14.6 gm/dl (ref 12.0–16.0)
Lymphocytes Absolute: 3.3 10*3/uL (ref 1.0–4.8)
Lymphocytes: 37.4 %
MCH: 31.2 pg — ABNORMAL HIGH (ref 27.0–31.0)
MCHC: 33.5 gm/dl (ref 33.0–37.0)
MCV: 93.1 fL (ref 81.0–99.0)
MPV: 9.8 mcm (ref 7.4–10.4)
Monocytes Absolute: 0.5 10*3/uL (ref 0.4–1.3)
Monocytes: 5.9 %
Platelets: 249 10*3/uL (ref 130–400)
RBC Morphology: NORMAL
RBC: 4.66 10*6/uL (ref 4.20–5.40)
RDW: 15.6 % — ABNORMAL HIGH (ref 11.5–14.5)
Seg Neutrophils: 53.8 %
Segs Absolute: 4.7 10*3/uL (ref 1.8–7.7)
WBC: 8.8 10*3/uL (ref 4.8–10.8)
nRBC: 0 /100 wbc

## 2013-02-03 LAB — COMPREHENSIVE METABOLIC PANEL
ALT: 71 U/L — ABNORMAL HIGH (ref 11–66)
AST: 53 U/L — ABNORMAL HIGH (ref 5–40)
Albumin: 3.9 gm/dl (ref 3.5–5.1)
Alkaline Phosphatase: 153 U/L — ABNORMAL HIGH (ref 38–126)
BUN: 10 mg/dl (ref 7–22)
CO2: 27 meq/l (ref 23–33)
Calcium: 9.4 mg/dl (ref 8.5–10.5)
Chloride: 104 meq/l (ref 98–111)
Creatinine: 0.9 mg/dl (ref 0.4–1.2)
Glucose: 104 mg/dl (ref 70–108)
Potassium: 3.8 meq/l (ref 3.5–5.2)
Sodium: 142 meq/l (ref 135–145)
Total Bilirubin: 0.4 mg/dl (ref 0.3–1.2)
Total Protein: 6.8 gm/dl (ref 6.1–8.0)

## 2013-02-03 LAB — LUTEINIZING HORMONE: LH: 29.4 m[IU]/mL (ref 0.6–43.9)

## 2013-02-03 LAB — T4, FREE: T4 Free: 0.9 ng/dl — ABNORMAL LOW (ref 0.93–1.76)

## 2013-02-03 LAB — HEMOGLOBIN A1C
AVERAGE GLUCOSE: 108 mg/dl (ref 70–126)
Hemoglobin A1C: 5.6 % (ref 4.4–6.4)

## 2013-02-03 LAB — LIPID PANEL
Cholesterol, Total: 197 mg/dl (ref 100–199)
HDL: 36 mg/dl
LDL Calculated: 121 mg/dl
Triglycerides: 200 mg/dl — ABNORMAL HIGH (ref 0–199)

## 2013-02-03 LAB — FOLLICLE STIMULATING HORMONE: FSH: 50 m[IU]/mL — ABNORMAL HIGH (ref 0.6–16.9)

## 2013-02-03 LAB — GLOMERULAR FILTRATION RATE, ESTIMATED: Est, Glom Filt Rate: 67 mL/min/{1.73_m2} — AB

## 2013-02-03 LAB — PROGESTERONE: Progesterone: <0.1 ng/ml

## 2013-02-03 LAB — T3: T3, Total: 166 ng/dl (ref 72–181)

## 2013-02-03 LAB — ESTRADIOL: Estradiol: 11 pg/ml

## 2013-02-03 LAB — TSH WITH REFLEX: TSH: 2.18 mcIU/ml (ref 0.400–4.20)

## 2013-02-03 LAB — PROLACTIN: Prolactin: 12.7 ng/ml

## 2013-02-03 NOTE — Patient Instructions (Signed)
Bring insurance info and drivers license  Wear comfortable clean clothing  Do not bring jewelry or valuables  Shower night before and morning of surgery with a liquid antibacterial soap  Bring  medications

## 2013-02-04 LAB — EKG 12-LEAD
Atrial Rate: 66 {beats}/min
P Axis: 41 degrees
P-R Interval: 124 ms
Q-T Interval: 404 ms
QRS Duration: 80 ms
QTc Calculation (Bazett): 423 ms
R Axis: 35 degrees
T Axis: -7 degrees
Ventricular Rate: 66 {beats}/min

## 2013-02-04 LAB — DHEA-SULFATE: DHEAS (DHEA Sulfate): 18 ug/dL — ABNORMAL LOW (ref 32–240)

## 2013-02-06 LAB — VITAMIN D 25 HYDROX, D2 & D3

## 2013-02-06 LAB — TESTOSTERONE FREE AND TOTAL, NON MALE: Testosterone, Females/Children: 9 ng/dL (ref 9–55)

## 2013-02-06 NOTE — H&P (Signed)
[facility]                                    [place], [place]                             PREOPERATIVE HISTORY and PHYSICAL     PATIENT NAME: Theresa Hobbs, Theresa A.                 DOB:  [birth year]  MEDICAL RECORD NO: [id number]                 ROOM: SD [id number]  ACCOUNT NO: [id number]                          DATE: 02/07/2013  PHYSICIAN: Jim Desanctis. Gerhart Ruggieri, M.D.        CHIEF COMPLAINT:       1.    Right inguinal pain.       2.    Ventral wall hernia x2.     HISTORY OF PRESENT ILLNESS:  The patient is a 47 year old white female.  It  is interesting that she was in the [employer] for 22 years, coming out in 2007.  Over the last year, she states she has gained over 100 pounds.  She has had  onset over the last 2 months of pain in the right lower abdomen and noted a  bulge that she clearly states will protrude and she has to reduce.  She has  also had a laparoscopic cholecystectomy performed in the past and noted a  bulge or protrusion with reduction at 1 of the trocar sites.  Because of this  pain, she went to the Emergency Room at [facility] and had a CT scan performed.  The  CT scan makes no mention of lower abdominal wall hernias or trocar site  hernia, but does state there is a small umbilical hernia fat.  The patient is  quite animated about her pain.  She states that she does have occasional  nausea and vomiting.  She denies radiation of the pain to the back, but the  pain is persistent in all the areas mentioned.     PAST MEDICAL HISTORY:  Positive for carcinoma of the kidney, psychiatric  abnormality, seizures, lupus, depression, anxiety, fibromyalgia,  posttraumatic stress disorder, hypertension and diabetes.     SOCIAL HISTORY:  She is married.  She is a smoker of 1 pack per day.     SURGERIES:  Includes a laparoscopic cholecystectomy as mentioned above and  hysterectomy.     ALLERGIES:  MULTIPLE INCLUDING IMITREX, AMOXICILLIN, BACTRIM, CIPRO,  ERYTHROMYCIN, FLAGYL, KLONOPIN, NUBAIN, STADOL,  PENICILLIN, REGLAN, SULFA.     MEDICATIONS:  Lisinopril, Abilify, metformin, Ambien, Zofran, Effexor,  Valium, Dilantin.     FAMILY HISTORY:  Positive for pancreatic cancer.     PHYSICAL EXAMINATION:  The patient is a very obese, 47 year old white female  with a BMI of 52.     HEAD, EARS, EYES, NOSE AND THROAT:  Show no scleral icterus.     CARDIOVASCULAR:  S1-S2.  The respirations are clear.     ABDOMEN:  Soft.  Bowel sounds are positive.     EXAMINATION OF THE GROIN:  Fails to reveal a definite right inguinal hernia.  The other trocar site hernia on the abdominal wall and  the umbilicus also are  unable to palpate hernias, but again, she is quite obese.     ASSESSMENT AND PLAN:       1.   Possible right inguinal hernia versus incisional hernias, as her             pain is right over a transverse incision from her hysterectomy.       2.   Possible trocar site hernia, right lateral abdominal wall.       3.   Umbilical hernia.     We discussed hernia repairs with the patient.  She is being admitted at this  time for repair of same.              Jim Desanctis. Marshaun Lortie, M.D.        D: 02/06/2013 22:06                                   T: 02/06/2013 22:45  als     CC:  Jim Desanctis. Pat Patrick, M.D.  [address]  [place], [place] [address]

## 2013-02-07 LAB — POCT GLUCOSE
POC Glucose: 119 mg/dl — ABNORMAL HIGH (ref 70–108)
POC Glucose: 126 mg/dl — ABNORMAL HIGH (ref 70–108)

## 2013-02-07 LAB — POTASSIUM: Potassium: 3.8 meq/l (ref 3.5–5.2)

## 2013-02-07 MED ORDER — ACETAMINOPHEN 325 MG PO TABS
325 | ORAL | Status: DC | PRN
Start: 2013-02-07 — End: 2013-02-09
  Administered 2013-02-08 – 2013-02-09 (×2): 650 mg via ORAL

## 2013-02-07 MED ORDER — DIPHENHYDRAMINE HCL 50 MG/ML IJ SOLN
50 | Freq: Once | INTRAMUSCULAR | Status: DC | PRN
Start: 2013-02-07 — End: 2013-02-07

## 2013-02-07 MED ORDER — HYDROMORPHONE HCL 2 MG/ML IJ SOLN
2 | INTRAMUSCULAR | Status: AC | PRN
Start: 2013-02-07 — End: 2013-02-07
  Administered 2013-02-07 (×3): 0.25 mg via INTRAVENOUS

## 2013-02-07 MED ORDER — ONDANSETRON HCL 4 MG/2ML IJ SOLN
4 | Freq: Once | INTRAMUSCULAR | Status: DC | PRN
Start: 2013-02-07 — End: 2013-02-07

## 2013-02-07 MED ORDER — HYDROMORPHONE HCL PF 1 MG/ML IJ SOLN
1 | INTRAMUSCULAR | Status: DC | PRN
Start: 2013-02-07 — End: 2013-02-09
  Administered 2013-02-08 – 2013-02-09 (×12): 1 mg via INTRAVENOUS

## 2013-02-07 MED ORDER — HYDRALAZINE HCL 20 MG/ML IJ SOLN
20 | INTRAMUSCULAR | Status: DC | PRN
Start: 2013-02-07 — End: 2013-02-07

## 2013-02-07 MED ORDER — HYDROMORPHONE HCL 2 MG/ML IJ SOLN
2 | INTRAMUSCULAR | Status: DC | PRN
Start: 2013-02-07 — End: 2013-02-07

## 2013-02-07 MED ORDER — HYDROCODONE-ACETAMINOPHEN 5-325 MG PO TABS
5-325 | Freq: Four times a day (QID) | ORAL | Status: DC | PRN
Start: 2013-02-07 — End: 2013-02-09
  Administered 2013-02-08 – 2013-02-09 (×3): 1 via ORAL

## 2013-02-07 MED ADMIN — HYDROmorphone (DILAUDID) 2 MG/ML injection: INTRAVENOUS | @ 19:00:00 | NDC 00409336501

## 2013-02-07 MED ADMIN — diazepam (VALIUM) tablet 5 mg: ORAL | @ 23:00:00 | NDC 68084035911

## 2013-02-07 MED ADMIN — ondansetron (ZOFRAN) injection 4 mg: INTRAVENOUS | @ 21:00:00 | NDC 00409475503

## 2013-02-07 MED ADMIN — promethazine (PHENERGAN) 25 MG/ML injection: INTRAMUSCULAR | @ 19:00:00 | NDC 00641092821

## 2013-02-07 MED ADMIN — 0.9 % sodium chloride infusion: INTRAVENOUS | @ 23:00:00 | NDC 00338004904

## 2013-02-07 MED ADMIN — HYDROmorphone HCl PF (DILAUDID) 1 MG/ML injection SOLN: INTRAVENOUS | @ 21:00:00 | NDC 00409128331

## 2013-02-07 MED ADMIN — fentaNYL (SUBLIMAZE) injection 50 mcg: 50 ug | INTRAVENOUS | @ 19:00:00 | NDC 00409909332

## 2013-02-07 MED ADMIN — fentaNYL (SUBLIMAZE) injection 50 mcg: 50 ug | INTRAVENOUS | @ 20:00:00 | NDC 00409909332

## 2013-02-07 MED FILL — METOPROLOL TARTRATE 25 MG PO TABS: 25 MG | ORAL | Qty: 1

## 2013-02-07 MED FILL — FENTANYL CITRATE 0.05 MG/ML IJ SOLN: 0.05 MG/ML | INTRAMUSCULAR | Qty: 2

## 2013-02-07 MED FILL — LIDOCAINE HCL 4 % MT SOLN: 4 % | OROMUCOSAL | Qty: 4

## 2013-02-07 MED FILL — ABILIFY 10 MG PO TABS: 10 MG | ORAL | Qty: 1

## 2013-02-07 MED FILL — HYDROMORPHONE HCL 2 MG/ML IJ SOLN: 2 MG/ML | INTRAMUSCULAR | Qty: 1

## 2013-02-07 MED FILL — ONDANSETRON 4 MG PO TBDP: 4 MG | ORAL | Qty: 1

## 2013-02-07 MED FILL — VENLAFAXINE HCL ER 150 MG PO CP24: 150 MG | ORAL | Qty: 1

## 2013-02-07 MED FILL — LEVETIRACETAM 500 MG PO TABS: 500 MG | ORAL | Qty: 1

## 2013-02-07 MED FILL — ONDANSETRON HCL 4 MG/2ML IJ SOLN: 4 MG/2ML | INTRAMUSCULAR | Qty: 2

## 2013-02-07 MED FILL — FENTANYL CITRATE 0.05 MG/ML IJ SOLN: 0.05 MG/ML | INTRAMUSCULAR | Qty: 5

## 2013-02-07 MED FILL — HYDROMORPHONE HCL PF 1 MG/ML IJ SOLN: 1 MG/ML | INTRAMUSCULAR | Qty: 1

## 2013-02-07 MED FILL — LIDOCAINE HCL (CARDIAC) 20 MG/ML IV SOLN: 20 MG/ML | INTRAVENOUS | Qty: 5

## 2013-02-07 MED FILL — PROMETHAZINE HCL 25 MG/ML IJ SOLN: 25 MG/ML | INTRAMUSCULAR | Qty: 1

## 2013-02-07 MED FILL — ZOLPIDEM TARTRATE 10 MG PO TABS: 10 MG | ORAL | Qty: 1

## 2013-02-07 MED FILL — MIDAZOLAM HCL 2 MG/2ML IJ SOLN: 2 MG/ML | INTRAMUSCULAR | Qty: 2

## 2013-02-07 MED FILL — PHENYTOIN SODIUM EXTENDED 100 MG PO CAPS: 100 MG | ORAL | Qty: 1

## 2013-02-07 MED FILL — DIPRIVAN 10 MG/ML IV EMUL: 10 MG/ML | INTRAVENOUS | Qty: 20

## 2013-02-07 MED FILL — QUELICIN 20 MG/ML IJ SOLN: 20 MG/ML | INTRAMUSCULAR | Qty: 10

## 2013-02-07 MED FILL — ROCURONIUM BROMIDE 50 MG/5ML IV SOLN: 50 MG/5ML | INTRAVENOUS | Qty: 5

## 2013-02-07 NOTE — Progress Notes (Signed)
Pt awake, tearful complaining pain so bad cant stand it. Pt nauseated from pain from coughing. Support given , medicated. Pt enc'd to cdb and use her I/S w/a pt voiced knows how to use. Pt up to br, voided dark yellow urine.returned to bed. Ice pack to abd lower right /groin area. That's the place hurts most. Discussed with pt pain and medications.  2000 Dr. Pat Patrick in discussed pt and pain issues. No change in orders. Pt updated.

## 2013-02-07 NOTE — Anesthesia Pre-Procedure Evaluation (Signed)
[facility]  PRE-ANESTHESIA EVALUATION FORM       Name:  Theresa Hobbs                                         Age:  47 y.o.  MRN:  [id number]           Allergies   Allergen Reactions   ??? Imitrex (Sumatriptan) Anaphylaxis   ??? Iv Dye (Iodides) Anaphylaxis   ??? Amoxicillin-Pot Clavulanate Hives and Itching   ??? Azithromycin Hives and Itching   ??? Bactrim Hives and Itching   ??? Cefuroxime Axetil Hives and Itching   ??? Ciprofloxacin Hives and Itching   ??? Erythromycin Hives and Itching   ??? Flagyl (Metronidazole) Hives and Itching   ??? Ketorolac Tromethamine Hives and Itching   ??? Klonopin (Clonazepam) Hives and Itching   ??? Levofloxacin Hives and Itching   ??? Nubain (Nalbuphine Hcl)      blister   ??? Other      Stadol-blisters    ??? Pcn (Penicillins) Hives and Itching   ??? Reglan (Metoclopramide)      Blistering    ??? Sulfa Antibiotics Hives and Itching   ??? Tramadol Hives and Itching     Patient Active Problem List   Diagnosis   ??? Seizure   ??? Head ache   ??? Nausea and vomiting   ??? Obesity   ??? Diarrhea   ??? Cancer of kidney   ??? PTSD (post-traumatic stress disorder)   ??? Cancer   ??? Chronic kidney disease   ??? Psychiatric problem   ??? Seizures   ??? Lupus   ??? Depression   ??? Anxiety   ??? Fibromyalgia   ??? Post traumatic stress disorder (PTSD)     Past Medical History   Diagnosis Date   ??? Cancer      kidney    ??? Chronic kidney disease    ??? Psychiatric problem    ??? Seizures      Pt voiced she has seizures.   ??? Lupus    ??? Depression    ??? Anxiety    ??? Fibromyalgia    ??? Post traumatic stress disorder (PTSD)    ??? HTN (hypertension)    ??? Diabetes mellitus      type 2      Past Surgical History   Procedure Laterality Date   ??? Cholecystectomy  2000     [place]    ??? Nose surgery  2007     polyp removed-[facility]    ??? Hysterectomy  1998     [facility]    ??? Colonoscopy  2004     Dr. Pat Patrick    ??? Upper gastrointestinal endoscopy  2004     Dr. Pat Patrick      History   Substance Use Topics   ??? Smoking status: Current Every Day Smoker  -- 0.25 packs/day for 6 years     Types: Cigarettes   ??? Smokeless tobacco: Never Used   ??? Alcohol Use: No     Medications  Current Outpatient Prescriptions on File Prior to Encounter   Medication Sig Dispense Refill   ??? lisinopril (PRINIVIL;ZESTRIL) 20 MG tablet Take 20 mg by mouth daily.       ??? metFORMIN (GLUCOPHAGE) 500 MG tablet Take 500 mg by mouth 2 times daily (with meals).       ???  HYDROcodone-acetaminophen (NORCO) 5-325 MG per tablet Take 1 tablet by mouth every 6 hours as needed for Pain for 10 doses.  10 tablet  0   ??? ARIPiprazole (ABILIFY) 10 MG tablet Take 10 mg by mouth daily.       ??? metoprolol (LOPRESSOR) 25 MG tablet Take 25 mg by mouth 2 times daily.       ??? LevETIRAcetam (KEPPRA PO) Take 500 mg by mouth 2 times daily.       ??? zolpidem (AMBIEN) 10 MG tablet Take 10 mg by mouth nightly as needed.       ??? ondansetron (ZOFRAN ODT) 4 MG disintegrating tablet Take 1 tablet by mouth every 8 hours as needed for Nausea for 15 doses.  15 tablet  0   ??? venlafaxine (EFFEXOR-XR) 150 MG XR capsule Take 150 mg by mouth daily.       ??? diazepam (VALIUM) 5 MG tablet Take 5 mg by mouth every 6 hours as needed.         ??? phenytoin (DILANTIN) 100 MG ER capsule Take 100 mg by mouth 3 times daily. May increase up to 5 100 mg tablets if needed (depending on dilantin level)         No current facility-administered medications on file prior to encounter.     Current Outpatient Prescriptions   Medication Sig Dispense Refill   ??? lisinopril (PRINIVIL;ZESTRIL) 20 MG tablet Take 20 mg by mouth daily.       ??? metFORMIN (GLUCOPHAGE) 500 MG tablet Take 500 mg by mouth 2 times daily (with meals).       ??? HYDROcodone-acetaminophen (NORCO) 5-325 MG per tablet Take 1 tablet by mouth every 6 hours as needed for Pain for 10 doses.  10 tablet  0   ??? ARIPiprazole (ABILIFY) 10 MG tablet Take 10 mg by mouth daily.       ??? metoprolol (LOPRESSOR) 25 MG tablet Take 25 mg by mouth 2 times daily.       ??? LevETIRAcetam (KEPPRA PO) Take 500 mg by  mouth 2 times daily.       ??? zolpidem (AMBIEN) 10 MG tablet Take 10 mg by mouth nightly as needed.       ??? ondansetron (ZOFRAN ODT) 4 MG disintegrating tablet Take 1 tablet by mouth every 8 hours as needed for Nausea for 15 doses.  15 tablet  0   ??? venlafaxine (EFFEXOR-XR) 150 MG XR capsule Take 150 mg by mouth daily.       ??? diazepam (VALIUM) 5 MG tablet Take 5 mg by mouth every 6 hours as needed.         ??? phenytoin (DILANTIN) 100 MG ER capsule Take 100 mg by mouth 3 times daily. May increase up to 5 100 mg tablets if needed (depending on dilantin level)         Current Facility-Administered Medications   Medication Dose Route Frequency Provider Last Rate Last Dose   ??? 0.9 % sodium chloride infusion   Intravenous Continuous Jim Desanctis Hixenbaugh, MD       ??? metoprolol (TOPROL-XL) XL tablet 25 mg  25 mg Oral Once Lequita Halt, MD         Vitals   Filed Vitals:    02/07/13 1050   BP: 138/78   Pulse: 102   Temp: 97.6 ??F (36.4 ??C)   Resp: 18     BP Readings from Last 3 Encounters:   02/07/13 138/78   01/21/13  136/84   04/17/12 176/88     BMI  Ht Readings from Last 1 Encounters:   02/07/13 5\' 2"  (1.575 m)     Wt Readings from Last 1 Encounters:   02/07/13 280 lb 6.8 oz (127.2 kg)     Body mass index is 51.28 kg/(m^2).  Estimated body mass index is 51.28 kg/(m^2) as calculated from the following:    Height as of this encounter: 5\' 2"  (1.575 m).    Weight as of this encounter: 280 lb 6.8 oz (127.2 kg).    CBC   Lab Results   Component Value Date    WBC 8.8 02/03/2013    WBC 7.3 01/22/2011    RBC 4.66 02/03/2013    RBC 4.40 01/22/2011    HGB 14.6 02/03/2013    HCT 43.4 02/03/2013    MCV 93.1 02/03/2013    RDW 15.6 02/03/2013    PLT 249 02/03/2013     CMP    Lab Results   Component Value Date    NA 142 02/03/2013    NA 142 01/11/2011    K 3.8 02/07/2013    K 4.4 01/11/2011    CL 104 02/03/2013    CO2 27 02/03/2013    BUN 10 02/03/2013    BUN 15 01/22/2011    CREATININE 0.9 02/03/2013    CREATININE 0.8 01/22/2011    CREATININE 0.7 01/11/2011    LABGLOM 67  02/03/2013    GLUCOSE 104 02/03/2013    GLUCOSE 146 01/22/2011    PROT 6.8 02/03/2013    CALCIUM 9.4 02/03/2013    BILITOT 0.4 02/03/2013    BILITOT 0.1 01/22/2011    ALKPHOS 153 02/03/2013    AST 53 02/03/2013    ALT 71 02/03/2013     BMP    Lab Results   Component Value Date    NA 142 02/03/2013    NA 142 01/11/2011    K 3.8 02/07/2013    K 4.4 01/11/2011    CL 104 02/03/2013    CO2 27 02/03/2013    BUN 10 02/03/2013    BUN 15 01/22/2011    CREATININE 0.9 02/03/2013    CREATININE 0.8 01/22/2011    CREATININE 0.7 01/11/2011    CALCIUM 9.4 02/03/2013    LABGLOM 67 02/03/2013    GLUCOSE 104 02/03/2013    GLUCOSE 146 01/22/2011     POCGlucose  No results found for this basename: GLUCOSE,  in the last 72 hours   Coags    No results found for this basename: PROTIME, INR, APTT     HCG (If Applicable)   Lab Results   Component Value Date    PREGSERUM NEGATIVE 04/27/2011      ABGs   No results found for this basename: PHART, PO2ART, PCO2ART, HCO3ART, BEART, O2SATART      Type & Screen (If Applicable)  No results found for this basename: LABABO, LABRH     EKG (If Applicable)   CXR (If Applicable)   Cardiac Testing (If Applicable)        Anesthesia Evaluation     Patient summary reviewed    Airway   Mallampati: II  TM distance: >3 FB  Neck ROM: full  Dental      Pulmonary     breath sounds clear to auscultation  Cardiovascular   (+) hypertension,     Rhythm: regular    Neuro/Psych    (+) neuromuscular disease, headaches, psychiatric history  GI/Hepatic/Renal    (+)  chronic renal disease,     Endo/Other  (+) , arthritis  Abdominal                   Allergies: Imitrex; Iv dye; Amoxicillin-pot clavulanate; Azithromycin; Bactrim; Cefuroxime axetil; Ciprofloxacin; Erythromycin; Flagyl; Ketorolac tromethamine; Klonopin; Levofloxacin; Nubain; Other; Pcn; Reglan; Sulfa antibiotics; and Tramadol    NPO Status:                         Time of last solid food consumption: 2355    Anesthesia Plan    ASA 2     general     intravenous induction   Anesthetic plan and risks discussed  with patient.    Plan discussed with CRNA.          Minerva Ends  02/07/2013

## 2013-02-07 NOTE — Progress Notes (Signed)
Pt admitted to sds with family.  Fall risk band applied.

## 2013-02-07 NOTE — Op Note (Signed)
[facility]                                    [place], [place]                                   RECORD OF OPERATION     PATIENT NAME: Theresa Hobbs, Theresa A.                 DOB: [birth year]  MEDICAL RECORD NO. [id number]                 ROOM: [id number]  ACCOUNT NO: [id number]                          DATE: 02/07/2013  SURGEON: Jim Desanctis. Karson Reede, M.D.        PREOPERATIVE DIAGNOSES:  1.  Right abdominal wall trocar site hernia.  2.  Small umbilical hernia.  3.  Right inguinal hernia.     POSTOPERATIVE DIAGNOSES:  1.  Right abdominal wall trocar site hernia.  2.  Small umbilical hernia.  3.  Right inguinal hernia.     OPERATION:  1.  Repair of right abdominal wall trocar site hernia with primary repair.  2.  Repair of small umbilical hernia with primary repair.  3.  Repair of right inguinal hernia with mesh patch and primary repair.     SURGEON:  Dr. Pat Patrick.     ANESTHESIA:  General.     COMPLICATIONS:  None.     INDICATION FOR PROCEDURE:  The patient is a challenging 48 year old white  female who is quite obese with a BMI of 52.  She states she has had a bulge  at the trocar site in the right upper abdomen, but given her obesity it was  difficult to palpate the hernia.  She also had right groin pain and had gone  to the Emergency Room.  She had a CT scan performed.  CT did not show a right  abdominal wall hernia or a right inguinal hernia, but did show a small  umbilical hernia.  The patient gives a fairly convincing history for bulging  at both the right abdominal wall and right groin and is brought to surgery  for repair of all 3 hernias.     FINDINGS:  The right abdominal wall trocar site hernia and the umbilical  hernia were quite small and were repaired primarily with O Novafil suture.  Due the patient's size, the inguinal hernia was very difficult to dissect  out, but appeared to be a small direct and a mesh plug was placed along with  primary repair with O Surgilon sutures.      PROCEDURE:  The patient was brought to the Operating Suite and placed supine  on the operating room table.  After adequate inhalational anesthesia was  administered, the patient's abdomen was prepped and draped in usual sterile  fashion.  An incision was made over the trocar site on the right side, taken  down through the subcutaneous tissue where a very small fascial defect was  seen.  Interrupted 0 Novafil (3 sutures) were placed to close the defect.  It  was not big enough to use mesh.  Incision was then made over the  infraumbilical area and again taken down through a  very obese abdomen where,  again, a small defect was seen.  The little hernia sac was dissected off of  the fascia and closed with 3 interrupted Novafil suture.  Incision was then  made in the right groin and taken down through the subcutaneous tissue to the  external oblique fascia.  Again, the patient's obesity made this dissection  quite difficult.  We incised through the superficial ring.  The round  ligament was identified and essentially removed.  There is no indirect sac,  but the floor of the groin did appear to show a small defect.  I placed a  mesh plug into this defect and elected to do a Bassini type repair taking the  Poupart's ligament to the conjoined tendon.  The external oblique fascia was  closed with running 0 Vicryl suture and interrupted 3-0 Vicryl was used close  the subcutaneous.  A running 4-0 Vicryl was used to close the skin.  Steri-Strips were applied.           Jim Desanctis. Brittanny Levenhagen, M.D.        D: 02/07/2013 20:37                                    T: 02/08/2013 21:03  cd     CC:  Jim Desanctis. Pat Patrick, M.D.  [address]  [place], [place] [address]

## 2013-02-07 NOTE — Progress Notes (Signed)
To pre op holding area on cart.

## 2013-02-07 NOTE — Anesthesia Post-Procedure Evaluation (Signed)
[facility]  POST-ANESTHESIA NOTE       Name:  Theresa Hobbs                                         Age:  47 y.o.  MRN:  [id number]      Last Vitals:  BP 136/66   Pulse 94   Temp(Src) 98.4 ??F (36.9 ??C) (Temporal)   Resp 10   Ht 5\' 2"  (1.575 m)   Wt 280 lb 6.8 oz (127.2 kg)   BMI 51.28 kg/m2   SpO2 97%  Patient Vitals for the past 4 hrs:   BP Temp Temp src Pulse Resp SpO2   02/07/13 1535 136/66 mmHg - - 94 10 97 %   02/07/13 1530 137/63 mmHg - - 90 13 97 %   02/07/13 1525 125/66 mmHg - - 92 21 98 %   02/07/13 1520 110/65 mmHg - - 91 17 100 %   02/07/13 1515 138/63 mmHg - - 90 10 98 %   02/07/13 1510 139/64 mmHg - - 83 17 99 %   02/07/13 1505 137/59 mmHg - - 78 14 100 %   02/07/13 1500 137/60 mmHg - - 80 14 99 %   02/07/13 1455 160/70 mmHg - - 86 17 98 %   02/07/13 1450 145/63 mmHg - - 96 16 97 %   02/07/13 1447 145/63 mmHg 98.4 ??F (36.9 ??C) Temporal 97 12 98 %       Level of Consciousness:  Awake    Respiratory:  Stable    Oxygen Saturation:  Stable    Cardiovascular:  Stable    Hydration:  Adequate    PONV:  Stable    Post-op Pain:  Adequate analgesia    Post-op Assessment:  No apparent anesthetic complications    Additional Follow-Up / Treatment / Comment:  None    Minerva Ends, MD  February 07, 2013   3:59 PM

## 2013-02-07 NOTE — Progress Notes (Signed)
1447: Arrived arouses, awake. O2 at 3 liters NC. Lungs clear. IV LFA infusing .9 20 ga without redness, swelling or pain. ABD surgery sites x 3, dressings are dry and intact. ABD is soft. C/O pain and nausea.Tearful and anxious. SCDs are on.  1450: Dr Roda Shutters paged for orders.  1455: Dilaudid started for pain. Anxious and tense. Encouraged to relax and do breathing exercises.  1500: Dilaudid continued for pain.  1505: Dilaudid for pain. Phenergan IM for nausea. States medicine is not working. Instructed on pain medicine in small doses and time of onset. Remains anxious.  1510: Dilaudid finished for pain.  1515: Fentanyl started for pain.  1520: Fentanyl given for pain. States nausea is totally gone. States pain is still 9. Remains anxious.  1525: Fentanyl for pain.  1530: Fentanyl finished for pain. States it is starting to work. States pain is at a 7. States she feels much better, starting to relax.   1535: Pain is down to 5. States she feels a little sleepy.  1540: States pain is 0. States it doesn't hurt to cough.  1545: Attempted to wean O2. Sats decrease and needs O2 at 3 liters to maintain sats above 92%.  1550: Voided 300-400 clear yellow, bedpan  1600: Dr Pat Patrick updated for orders.   1605: Admitting called for a bed.   1610: States pain is coming back, anxious. ABD is soft and dressings are dry and intact.   1620: Report called to 5E. Waiting for transport in stable condition. Family updated through SDS.   1637: Taken to 5E66 in stable condition. A little tearful and anxious.

## 2013-02-07 NOTE — Progress Notes (Signed)
Patient admitted to [phone] s/p hernia repair x 3. Sites with dry gauze dressing all dry and intact. Complains of abd. Pain and nausea. Bowel sounds minimal, not passing any gas. SCD's to bilateral lower extremities. Patient taking ice chips and water. Chemstick 126. IV of .9NS infusing @ 16800ml/hr with 200 ml left to infuse site LAC without difficulty. 02 NC 3L and sating 97%.

## 2013-02-07 NOTE — Brief Op Note (Signed)
Brief Postoperative Note    Theresa Hobbs  Date of Birth:  October 12, 1965  [id number]    Pre-operative Diagnosis: 1. R Abd. Wall Trocar Site Hernia  2. Umbilical Hernia  3. RIH    Post-operative Diagnosis: Same    Procedure: 1. Repair Trocar Site Hernia  2. Repair Umbilical Hernia  3. Repair  RIH    Anesthesia: General    Surgeons/Assistants: Jim Desanctis. Lorali Khamis    Estimated Blood Loss: less than 50     Complications: None    Specimens: Was  Not Obtained    Findings:  See op note    Electronically signed by Jim Desanctis. Prestin Munch, MD on 02/07/2013 at 4:02 PM

## 2013-02-08 LAB — MICROSCOPIC URINALYSIS
Bilirubin Urine: NEGATIVE
Casts: NONE SEEN /lpf
Casts: NONE SEEN /lpf
Crystals: NONE SEEN
Epithelial Cells, UA: 0 /hpf (ref 3–?)
Glucose, Urine: NEGATIVE mg/dl
Ketones, Urine: NEGATIVE
Leukocytes, UA: NEGATIVE
Miscellaneous Lab Test Result: NONE SEEN
Nitrite, Urine: NEGATIVE
Protein, UA: NEGATIVE mg/dl
RBC, UA: 0 /hpf (ref 0–?)
Renal Epithelial, UA: NONE SEEN
Specific Gravity, UA: 1.017 (ref 1.002–1.03)
Urobilinogen, Urine: 1 eu/dl (ref 0.0–1.0)
WBC, UA: 0 /hpf (ref 0–?)
Yeast, UA: NONE SEEN
pH, UA: 6 (ref 5.0–9.0)

## 2013-02-08 LAB — CBC
Hematocrit: 37.9 % (ref 37.0–47.0)
Hemoglobin: 13.1 gm/dl (ref 12.0–16.0)
MCH: 32.2 pg — ABNORMAL HIGH (ref 27.0–31.0)
MCHC: 34.5 gm/dl (ref 33.0–37.0)
MCV: 93.4 fL (ref 81.0–99.0)
MPV: 9.4 mcm (ref 7.4–10.4)
Platelets: 209 10*3/uL (ref 130–400)
RBC: 4.06 10*6/uL — ABNORMAL LOW (ref 4.20–5.40)
RDW: 14.8 % — ABNORMAL HIGH (ref 11.5–14.5)
WBC: 12.5 10*3/uL — ABNORMAL HIGH (ref 4.8–10.8)

## 2013-02-08 LAB — TROPONIN: Troponin T: <0.01 ng/ml

## 2013-02-08 LAB — POCT GLUCOSE
POC Glucose: 103 mg/dl (ref 70–108)
POC Glucose: 113 mg/dl — ABNORMAL HIGH (ref 70–108)
POC Glucose: 148 mg/dl — ABNORMAL HIGH (ref 70–108)
POC Glucose: 98 mg/dl (ref 70–108)
POC Glucose: 98 mg/dl (ref 70–108)

## 2013-02-08 LAB — EKG 12-LEAD
Atrial Rate: 118 {beats}/min
P Axis: 52 degrees
P-R Interval: 132 ms
Q-T Interval: 316 ms
QRS Duration: 68 ms
QTc Calculation (Bazett): 442 ms
R Axis: 36 degrees
T Axis: 15 degrees
Ventricular Rate: 118 {beats}/min

## 2013-02-08 MED ORDER — CEFAZOLIN SODIUM-DEXTROSE 2-3 GM-% IV SOLR
2-3 | Freq: Three times a day (TID) | INTRAVENOUS | Status: DC
Start: 2013-02-08 — End: 2013-02-09
  Administered 2013-02-08 – 2013-02-09 (×4): via INTRAVENOUS

## 2013-02-08 MED ORDER — SODIUM CHLORIDE 0.9 % IJ SOLN
0.9 | Freq: Every day | INTRAMUSCULAR | Status: DC
Start: 2013-02-08 — End: 2013-02-09

## 2013-02-08 MED ORDER — PHENYTOIN SODIUM 50 MG/ML IJ SOLN
50 | Freq: Three times a day (TID) | INTRAMUSCULAR | Status: DC
Start: 2013-02-08 — End: 2013-02-09
  Administered 2013-02-08 – 2013-02-09 (×3): via INTRAVENOUS

## 2013-02-08 MED ORDER — SODIUM CHLORIDE 0.9 % IV SOLN
0.9 | Freq: Two times a day (BID) | INTRAVENOUS | Status: DC
Start: 2013-02-08 — End: 2013-02-09

## 2013-02-08 MED ORDER — DIPHENHYDRAMINE HCL 50 MG/ML IJ SOLN
50 | Freq: Four times a day (QID) | INTRAMUSCULAR | Status: DC | PRN
Start: 2013-02-08 — End: 2013-02-09
  Administered 2013-02-08 – 2013-02-09 (×2): via INTRAVENOUS

## 2013-02-08 MED ORDER — CLINDAMYCIN PHOSPHATE IN D5W 600 MG/50ML IV SOLN
600 | Freq: Four times a day (QID) | INTRAVENOUS | Status: DC
Start: 2013-02-08 — End: 2013-02-08

## 2013-02-08 MED ORDER — PANTOPRAZOLE SODIUM 40 MG IV SOLR
40 | Freq: Every day | INTRAVENOUS | Status: DC
Start: 2013-02-08 — End: 2013-02-09
  Administered 2013-02-08: 13:00:00 via INTRAVENOUS

## 2013-02-08 MED ADMIN — ondansetron (ZOFRAN) injection 4 mg: INTRAVENOUS | @ 20:00:00 | NDC 00409475503

## 2013-02-08 MED ADMIN — ondansetron (ZOFRAN) injection 4 mg: INTRAVENOUS | @ 12:00:00 | NDC 00409475503

## 2013-02-08 MED ADMIN — ARIPiprazole (ABILIFY) tablet 10 mg: ORAL | @ 13:00:00 | NDC 59148000813

## 2013-02-08 MED ADMIN — 0.9 % sodium chloride infusion: INTRAVENOUS | @ 07:00:00 | NDC 00338004904

## 2013-02-08 MED ADMIN — diazepam (VALIUM) tablet 5 mg: ORAL | @ 05:00:00 | NDC 68084035911

## 2013-02-08 MED ADMIN — diazepam (VALIUM) tablet 5 mg: ORAL | NDC 68084035911

## 2013-02-08 MED ADMIN — ondansetron (ZOFRAN) injection 4 mg: INTRAVENOUS | @ 06:00:00 | NDC 00409475503

## 2013-02-08 MED ADMIN — enoxaparin (LOVENOX) injection 40 mg: SUBCUTANEOUS | @ 13:00:00 | NDC 00075801401

## 2013-02-08 MED ADMIN — lisinopril (PRINIVIL;ZESTRIL) tablet 20 mg: ORAL | @ 13:00:00 | NDC 00185010201

## 2013-02-08 MED ADMIN — zolpidem (AMBIEN) tablet 10 mg: ORAL | @ 07:00:00 | NDC 00378531001

## 2013-02-08 MED ADMIN — sodium chloride flush 0.9 % injection 10 mL: INTRAVENOUS | @ 13:00:00

## 2013-02-08 MED ADMIN — 0.9 % sodium chloride infusion: INTRAVENOUS | NDC 00338004904

## 2013-02-08 MED ADMIN — metoprolol (LOPRESSOR) tablet 25 mg: ORAL | @ 13:00:00 | NDC 57664050658

## 2013-02-08 MED ADMIN — promethazine (PHENERGAN) injection 12.5 mg: INTRAMUSCULAR | @ 02:00:00 | NDC 00641092821

## 2013-02-08 MED FILL — ONDANSETRON 4 MG PO TBDP: 4 MG | ORAL | Qty: 1

## 2013-02-08 MED FILL — HYDROMORPHONE HCL PF 1 MG/ML IJ SOLN: 1 MG/ML | INTRAMUSCULAR | Qty: 1

## 2013-02-08 MED FILL — DIPHENHYDRAMINE HCL 50 MG/ML IJ SOLN: 50 MG/ML | INTRAMUSCULAR | Qty: 1

## 2013-02-08 MED FILL — PHENYTOIN SODIUM EXTENDED 100 MG PO CAPS: 100 MG | ORAL | Qty: 1

## 2013-02-08 MED FILL — PROMETHAZINE HCL 25 MG/ML IJ SOLN: 25 MG/ML | INTRAMUSCULAR | Qty: 1

## 2013-02-08 MED FILL — CLINDAMYCIN PHOSPHATE IN D5W 600 MG/50ML IV SOLN: 600 MG/50ML | INTRAVENOUS | Qty: 50

## 2013-02-08 MED FILL — LISINOPRIL 20 MG PO TABS: 20 MG | ORAL | Qty: 1

## 2013-02-08 MED FILL — ZOLPIDEM TARTRATE 10 MG PO TABS: 10 MG | ORAL | Qty: 1

## 2013-02-08 MED FILL — DIAZEPAM 5 MG PO TABS: 5 MG | ORAL | Qty: 1

## 2013-02-08 MED FILL — PROTONIX 40 MG IV SOLR: 40 MG | INTRAVENOUS | Qty: 40

## 2013-02-08 MED FILL — METOPROLOL TARTRATE 25 MG PO TABS: 25 MG | ORAL | Qty: 1

## 2013-02-08 MED FILL — ONDANSETRON HCL 4 MG/2ML IJ SOLN: 4 MG/2ML | INTRAMUSCULAR | Qty: 2

## 2013-02-08 MED FILL — LEVETIRACETAM 500 MG PO TABS: 500 MG | ORAL | Qty: 1

## 2013-02-08 MED FILL — ABILIFY 10 MG PO TABS: 10 MG | ORAL | Qty: 1

## 2013-02-08 MED FILL — LEVETIRACETAM 500 MG/5ML IV SOLN: 500 MG/5ML | INTRAVENOUS | Qty: 5

## 2013-02-08 MED FILL — LOVENOX 40 MG/0.4ML SC SOLN: 40 MG/0.4ML | SUBCUTANEOUS | Qty: 0.4

## 2013-02-08 MED FILL — VENLAFAXINE HCL ER 150 MG PO CP24: 150 MG | ORAL | Qty: 1

## 2013-02-08 MED FILL — PHENYTOIN SODIUM 50 MG/ML IJ SOLN: 50 MG/ML | INTRAMUSCULAR | Qty: 2

## 2013-02-08 MED FILL — HYDROCODONE-ACETAMINOPHEN 5-325 MG PO TABS: 5-325 MG | ORAL | Qty: 1

## 2013-02-08 MED FILL — CEFAZOLIN SODIUM-DEXTROSE 2-3 GM-% IV SOLR: 2-3 GM-% | INTRAVENOUS | Qty: 2

## 2013-02-08 MED FILL — ACETAMINOPHEN 325 MG PO TABS: 325 MG | ORAL | Qty: 2

## 2013-02-08 NOTE — Plan of Care (Signed)
Problem: SAFETY  Goal: Free from accidental physical injury  Outcome: Ongoing  Patient has remained free from falls this shift. Patient is alert and oriented times four. Bed to lowest position with door open. Patient care items and call light in reach. Patient uses call light appropriately for assist. Will continue to monitor. Please see fall assessment.         Problem: DAILY CARE  Goal: Daily care needs are met  Outcome: Ongoing  Pt. Able to perform activities of daily living with little or no assistance. Bed linens checked and changed as needed.        Problem: SKIN INTEGRITY  Goal: Skin integrity is maintained or improved  Outcome: Ongoing  Encouraged pt. To turn in bed. Encouraged to ambulate. Heels elevated. Encouraged fluids. Will continue to assess needs. No skin breakdown issues at this time.      Patient informed of and included in their plan of care.

## 2013-02-08 NOTE — Plan of Care (Signed)
Problem: SAFETY  Goal: Free from accidental physical injury  Outcome: Ongoing  Pt free from falls a this time. Is on fall prevention due to medications that may make her sleepy. And recent surgery as well. Pt uses call light when she needs assist.    Problem: DAILY CARE  Goal: Daily care needs are met  Outcome: Ongoing  Pt able to assist with her hygiene care with minimal assist as well.     Problem: PAIN  Goal: Patient???s pain/discomfort is manageable  Outcome: Ongoing  Pt c/o pain of 7. Using IV dilaudid for pain control and percocet oral. Pt has been nauseated so only using dilaudid at this time. Pt able to drift off to rest with eyes closed and states pain was 0. after dilaudid given    Problem: SKIN INTEGRITY  Goal: Skin integrity is maintained or improved  Outcome: Ongoing  Pt with 3 abdominal surgical sites with dressings dry and intact. No redness, edema, drainage or separation noted to incisions    Problem: KNOWLEDGE DEFICIT  Goal: Patient/S.O. demonstrates understanding of disease process, treatment plan, medications, and discharge instructions.  Outcome: Ongoing  Pt encouraged to use incentive and explained importance of ambulating as well. Pt able to ambulate to bathroom with minimal assist-tol fir    Problem: DISCHARGE BARRIERS  Goal: Patient???s continuum of care needs are met  Outcome: Ongoing  Pt plans to return home with family at discharge    Comments:   Care plan reviewed with patient  Patient  verbalize understanding of the plan of care and contribute to goal setting.

## 2013-02-08 NOTE — Progress Notes (Signed)
[facility]  General Surgery - Dr. Maralyn Sago L. Raliegh Scobie  Postoperative Progress Note for Dr. Pat Patrick    Pt Name: Theresa Hobbs  Medical Record Number: [id number]  Date of Birth Nov 06, 1965   Today's Date: 02/08/2013    SUBJECTIVE  Joi complains nausea and GERD symptoms. She has low grade temp      OBJECTIVE  VITALS: VITALS:  BP 140/70   Pulse 92   Temp(Src) 100.7 ??F (38.2 ??C) (Oral)   Resp 18   Ht 5\' 2"  (1.575 m)   Wt 280 lb 6.8 oz (127.2 kg)   BMI 51.28 kg/m2   SpO2 96%  GENERAL: mild distress  LUNGS: clear to ausculation, without wheezes, rales or rhonci  HEART: ST  ABDOMEN: soft incisional tenderness  INCISION: clean, dry and intact  EXTERMITY: no edema  INTAKE/OUTPUT :   INTAKE/OUTPUT:    Intake/Output Summary (Last 24 hours) at 02/08/13 0817  Last data filed at 02/08/13 0600   Gross per 24 hour   Intake 2684.27 ml   Output    950 ml   Net 1734.27 ml        LABSnone            ASSESSMENT  1. POD # 1 repair of 3 abdominal wall hernias by Dr. Pat Patrick  2. GERD  3. Nausea  PLAN  1. start Protonix  2. VTE: lovenox  3. Continue hydration as not taking po  4. Check EKG,  Troponin rule out cardiac etiology  5. IV Dilantin as can not take po right now  Margret Chance, MD  Electronically signed 02/08/2013 at 8:15 AM

## 2013-02-08 NOTE — Progress Notes (Signed)
Pt tolerated Ancef infusion only c/o itching

## 2013-02-08 NOTE — Progress Notes (Signed)
Pt ambulated in hall with 1 assist-tol fair.

## 2013-02-08 NOTE — Care Coordination-Inpatient (Signed)
Patient Location:5E66 for s/p abdominal wall repair x3 ; admitted as an outpt in a bed.        Admit order signed?:yes  Day of stay/Length of Stay:  Day 1/ LOS .75  Procedures:ABD WALL HERNIA REPAIR X 3   Pertinent abnormal labs/xrays:unremarkable  IV Medications/treatment plan:IV fluids until taking PO well   Medicare form signed:n/a   DVT Prophylaxis: lovenox  Core Measures:vaccines:up to date   Discharge plan:plans home with spouse.

## 2013-02-08 NOTE — Progress Notes (Signed)
Offered to ambulate pt, pt declined at this time

## 2013-02-08 NOTE — Care Coordination-Inpatient (Signed)
Called admitting regarding patient 's status to be corrected to outpatient in a bed.  Dr. Pat Patrick had wrote order 02/07/13 @ 1615.

## 2013-02-08 NOTE — Plan of Care (Signed)
Problem: SAFETY  Goal: Free from accidental physical injury  Outcome: Ongoing  Safety measures maintained, call light in reach, side rails up x2. Pt reminded needs to ask for help oob.    Problem: DAILY CARE  Goal: Daily care needs are met  Outcome: Ongoing  Assisted with adl as needed, enc'd pt to do more for self    Problem: PAIN  Goal: Patient???s pain/discomfort is manageable  Outcome: Ongoing  Pt provided pain medications as needed , support given and pt updated on medications, times and drugs on board in room for pt.    Problem: SKIN INTEGRITY  Goal: Skin integrity is maintained or improved  Outcome: Ongoing  Pt abd incisions /drsg observed during care times and prn for drainage or issues    Problem: KNOWLEDGE DEFICIT  Goal: Patient/S.O. demonstrates understanding of disease process, treatment plan, medications, and discharge instructions.  Outcome: Ongoing  P[t teaching discussed. Pt reminded of importance cdb, ambulating and using I/S wa    Problem: DISCHARGE BARRIERS  Goal: Patient???s continuum of care needs are met  Outcome: Ongoing  Assess for any possible needs prior to discharge

## 2013-02-08 NOTE — Progress Notes (Signed)
Pt ambulated in hall with 1 assist-tol fair

## 2013-02-08 NOTE — Progress Notes (Signed)
Dr Neoma Laming in to see pt-Dr made aware of pts pulse rate and nausea. Orders received

## 2013-02-08 NOTE — Progress Notes (Signed)
Dr Neoma Laming notified of pt temp of 102.9 oral, orders received

## 2013-02-08 NOTE — Progress Notes (Signed)
Temp 102.9 oral will notify Dr Neoma Laming

## 2013-02-08 NOTE — Progress Notes (Signed)
U/a obtained labeled and sent to lab

## 2013-02-08 NOTE — Progress Notes (Signed)
Clinical Pharmacy Note  Phenytoin & Levetiracetam Dosing Consult      Joi A Bow is a 47 y.o. female for whom pharmacy has been consulted to dose phenytoin.    Patient Active Problem List   Diagnosis   ??? Seizure   ??? Head ache   ??? Nausea and vomiting   ??? Obesity   ??? Diarrhea   ??? Cancer of kidney   ??? PTSD (post-traumatic stress disorder)   ??? Cancer   ??? Chronic kidney disease   ??? Psychiatric problem   ??? Seizures   ??? Lupus   ??? Depression   ??? Anxiety   ??? Fibromyalgia   ??? Post traumatic stress disorder (PTSD)       Allergies:  Imitrex; Iv dye; Amoxicillin-pot clavulanate; Azithromycin; Bactrim; Cefuroxime axetil; Ciprofloxacin; Erythromycin; Flagyl; Ketorolac tromethamine; Klonopin; Levofloxacin; Nubain; Other; Pcn; Reglan; Sulfa antibiotics; and Tramadol     No results found for this basename: CREATININE,  in the last 72 hours    Lab Results   Component Value Date/Time    LABALBU 3.9 02/03/2013 12:08 PM    LABALBU 4.1 01/22/2011 11:32 PM       Ht/Wt:   Ht Readings from Last 1 Encounters:   02/07/13 5\' 2"  (1.575 m)        Wt Readings from Last 1 Encounters:   02/07/13 280 lb 6.8 oz (127.2 kg)         Estimated Creatinine Clearance: 99 ml/min (based on Cr of 0.9).    Indication for phenytoin, levetiracetam: Hx seizures    Assessment/Plan:  1. Patient nauseated, unable to tolerate PO, consulted to dose for conversion to IV.  2. Start Phenytoin IV 100mg  q8 hours  3. Start Levetiracetam IV 500mg  twice daily      Thank you for the consult.  Will continue to follow.    Ermalene Searing, PharmD 02/08/2013 8:51 AM

## 2013-02-08 NOTE — Progress Notes (Signed)
Patient is resting in bed with eyes closed. When awake patient is very anxious, angry, and tearful. Patient drifting off to sleep in conversation multiple times. Snoring softly. Patient wakes and She states "I can't believe you people take so long" yelling at Clinical research associate. Apology given to patient. Offered items of comfort - sherbet - ice pack- repositioning. Explained Keppra medication and the importance of taking this medicine. Patient refused medication due to nausea. Gave patient Zofran. Offered keppra with medication education. Patient became angry with Clinical research associate and requested care to be transferred to another RN. Report given.

## 2013-02-09 LAB — BASIC METABOLIC PANEL
BUN: 7 mg/dl (ref 7–22)
CO2: 28 meq/l (ref 23–33)
Calcium: 8.3 mg/dl — ABNORMAL LOW (ref 8.5–10.5)
Chloride: 102 meq/l (ref 98–111)
Creatinine: 1.1 mg/dl (ref 0.4–1.2)
Glucose: 116 mg/dl — ABNORMAL HIGH (ref 70–108)
Potassium: 3.9 meq/l (ref 3.5–5.2)
Sodium: 137 meq/l (ref 135–145)

## 2013-02-09 LAB — CBC
Hematocrit: 35.4 % — ABNORMAL LOW (ref 37.0–47.0)
Hemoglobin: 11.8 gm/dl — ABNORMAL LOW (ref 12.0–16.0)
MCH: 31.7 pg — ABNORMAL HIGH (ref 27.0–31.0)
MCHC: 33.4 gm/dl (ref 33.0–37.0)
MCV: 95 fL (ref 81.0–99.0)
MPV: 9 mcm (ref 7.4–10.4)
Platelets: 182 10*3/uL (ref 130–400)
RBC: 3.72 10*6/uL — ABNORMAL LOW (ref 4.20–5.40)
RDW: 14.6 % — ABNORMAL HIGH (ref 11.5–14.5)
WBC: 12.4 10*3/uL — ABNORMAL HIGH (ref 4.8–10.8)

## 2013-02-09 LAB — POCT GLUCOSE: POC Glucose: 120 mg/dl — ABNORMAL HIGH (ref 70–108)

## 2013-02-09 LAB — GLOMERULAR FILTRATION RATE, ESTIMATED: Est, Glom Filt Rate: 53 mL/min/{1.73_m2} — AB

## 2013-02-09 MED ORDER — HYDROCODONE-ACETAMINOPHEN 5-325 MG PO TABS
5-325 | Freq: Four times a day (QID) | ORAL | Status: DC | PRN
Start: 2013-02-09 — End: 2013-02-09

## 2013-02-09 MED ORDER — OXYCODONE-ACETAMINOPHEN 5-325 MG PO TABS
5-325 | Freq: Four times a day (QID) | ORAL | Status: DC | PRN
Start: 2013-02-09 — End: 2013-02-09
  Administered 2013-02-09: 16:00:00 2 via ORAL

## 2013-02-09 MED ORDER — POLYETHYLENE GLYCOL 3350 17 G PO PACK
17 g | Freq: Every day | ORAL | Status: DC | PRN
Start: 2013-02-09 — End: 2013-05-29

## 2013-02-09 MED ORDER — OXYCODONE-ACETAMINOPHEN 5-325 MG PO TABS
5-325 MG | ORAL_TABLET | Freq: Four times a day (QID) | ORAL | Status: DC | PRN
Start: 2013-02-09 — End: 2013-02-12

## 2013-02-09 MED ORDER — KETOROLAC TROMETHAMINE 30 MG/ML IJ SOLN
30 | Freq: Four times a day (QID) | INTRAMUSCULAR | Status: DC | PRN
Start: 2013-02-09 — End: 2013-02-09
  Administered 2013-02-09 (×2): 30 mg via INTRAVENOUS

## 2013-02-09 MED ORDER — DIAZEPAM 5 MG PO TABS
5 MG | ORAL_TABLET | Freq: Four times a day (QID) | ORAL | Status: DC | PRN
Start: 2013-02-09 — End: 2013-02-26

## 2013-02-09 MED ORDER — DSS 100 MG PO CAPS
100 MG | ORAL_CAPSULE | Freq: Two times a day (BID) | ORAL | Status: DC | PRN
Start: 2013-02-09 — End: 2013-02-26

## 2013-02-09 MED ORDER — OXYCODONE-ACETAMINOPHEN 5-325 MG PO TABS
5-325 | Freq: Four times a day (QID) | ORAL | Status: DC | PRN
Start: 2013-02-09 — End: 2013-02-09

## 2013-02-09 MED ADMIN — docusate sodium (COLACE) capsule 100 mg: ORAL | @ 16:00:00 | NDC 57896040110

## 2013-02-09 MED ADMIN — venlafaxine (EFFEXOR-XR) XR capsule 150 mg: ORAL | @ 13:00:00 | NDC 68382003606

## 2013-02-09 MED ADMIN — ondansetron (ZOFRAN) injection 4 mg: INTRAVENOUS | @ 01:00:00 | NDC 00409475503

## 2013-02-09 MED ADMIN — diazepam (VALIUM) tablet 5 mg: ORAL | @ 15:00:00 | NDC 68084035911

## 2013-02-09 MED ADMIN — enoxaparin (LOVENOX) injection 40 mg: SUBCUTANEOUS | @ 13:00:00 | NDC 00075801401

## 2013-02-09 MED ADMIN — lisinopril (PRINIVIL;ZESTRIL) tablet 20 mg: ORAL | @ 13:00:00 | NDC 00185010201

## 2013-02-09 MED ADMIN — 0.9 % sodium chloride infusion: INTRAVENOUS | @ 09:00:00 | NDC 00338004904

## 2013-02-09 MED ADMIN — phenytoin (DILANTIN) ER capsule 100 mg: ORAL | @ 14:00:00 | NDC 62756040201

## 2013-02-09 MED ADMIN — metoprolol (LOPRESSOR) tablet 25 mg: ORAL | @ 13:00:00 | NDC 57664050658

## 2013-02-09 MED ADMIN — ARIPiprazole (ABILIFY) tablet 10 mg: ORAL | @ 13:00:00 | NDC 59148000813

## 2013-02-09 MED ADMIN — zolpidem (AMBIEN) tablet 10 mg: ORAL | @ 02:00:00 | NDC 00378531001

## 2013-02-09 MED ADMIN — ondansetron (ZOFRAN) injection 4 mg: INTRAVENOUS | @ 15:00:00 | NDC 00409475503

## 2013-02-09 MED FILL — DIPHENHYDRAMINE HCL 50 MG/ML IJ SOLN: 50 MG/ML | INTRAMUSCULAR | Qty: 1

## 2013-02-09 MED FILL — PHENYTOIN SODIUM 50 MG/ML IJ SOLN: 50 MG/ML | INTRAMUSCULAR | Qty: 2

## 2013-02-09 MED FILL — HYDROCODONE-ACETAMINOPHEN 5-325 MG PO TABS: 5-325 MG | ORAL | Qty: 1

## 2013-02-09 MED FILL — DOCUSATE SODIUM 100 MG PO CAPS: 100 MG | ORAL | Qty: 1

## 2013-02-09 MED FILL — CEFAZOLIN SODIUM-DEXTROSE 2-3 GM-% IV SOLR: 2-3 GM-% | INTRAVENOUS | Qty: 2

## 2013-02-09 MED FILL — HYDROMORPHONE HCL PF 1 MG/ML IJ SOLN: 1 MG/ML | INTRAMUSCULAR | Qty: 1

## 2013-02-09 MED FILL — KETOROLAC TROMETHAMINE 30 MG/ML IJ SOLN: 30 MG/ML | INTRAMUSCULAR | Qty: 1

## 2013-02-09 MED FILL — LEVETIRACETAM 500 MG/5ML IV SOLN: 500 MG/5ML | INTRAVENOUS | Qty: 5

## 2013-02-09 MED FILL — ONDANSETRON HCL 4 MG/2ML IJ SOLN: 4 MG/2ML | INTRAMUSCULAR | Qty: 2

## 2013-02-09 MED FILL — OXYCODONE-ACETAMINOPHEN 5-325 MG PO TABS: 5-325 MG | ORAL | Qty: 2

## 2013-02-09 MED FILL — ACETAMINOPHEN 325 MG PO TABS: 325 MG | ORAL | Qty: 2

## 2013-02-09 MED FILL — DIAZEPAM 5 MG PO TABS: 5 MG | ORAL | Qty: 1

## 2013-02-09 MED FILL — PHENYTOIN SODIUM EXTENDED 100 MG PO CAPS: 100 MG | ORAL | Qty: 1

## 2013-02-09 MED FILL — MIRALAX 17 G PO PACK: 17 g | ORAL | Qty: 1

## 2013-02-09 MED FILL — LOVENOX 40 MG/0.4ML SC SOLN: 40 MG/0.4ML | SUBCUTANEOUS | Qty: 0.4

## 2013-02-09 NOTE — Plan of Care (Signed)
Problem: SAFETY  Goal: Free from accidental physical injury  Outcome: Ongoing  Pt free from falls at this time. Is fairly steady on feet. On fall prevention due to recent surgery and medications that may make her sleepy    Problem: DAILY CARE  Goal: Daily care needs are met  Outcome: Ongoing  Pt refuses to bath at this time. She did brush her teeth, but did not want to clean up. Pt able to bath self with minimal assist    Problem: PAIN  Goal: Patient???s pain/discomfort is manageable  Outcome: Ongoing  Pt states pain of 5.  Pt using toradol IV and dilaudid IV and norco for pain relief with some relief. Pt is able to rest with eyes closed and audible snoring noted when sleeping    Problem: SKIN INTEGRITY  Goal: Skin integrity is maintained or improved  Outcome: Ongoing  Pt with abdominal incisions x 3 with staples intact. Dr Sharla Kidney removed dressings from abdomen. No redness, edema, drainage or separation noted to area    Problem: KNOWLEDGE DEFICIT  Goal: Patient/S.O. demonstrates understanding of disease process, treatment plan, medications, and discharge instructions.  Outcome: Ongoing  Explained to pt that she has to be afebrile for 24 hours following a fever in able to go home. Pt verbalized understanding.  Enc pt to continue to use incentive spirometry and continue ambulating as well    Problem: DISCHARGE BARRIERS  Goal: Patient???s continuum of care needs are met  Outcome: Ongoing  Pt plans to return home with husband at discharge    Comments:   Care plan reviewed with patient .  Patient  verbalize understanding of the plan of care and contribute to goal setting.

## 2013-02-09 NOTE — Progress Notes (Signed)
Discharge instructions and prescriptions given to pt with verbalized understanding

## 2013-02-09 NOTE — Discharge Instructions (Addendum)
See Dr Sena Hitch orders for activity  watch for any redness, pain, swelling, or warmth to one or both calves of legs, or any chest pain. As these may be indicative of a blood clot. Please seek immediate attention. Go to nearest hospital to be evaluated  Continue to ambulate and use incentive spirometry at home to help keep your lungs clear and to keep your legs exercised.

## 2013-02-09 NOTE — Discharge Instructions (Signed)
DR. Vira Browns DISCHARGE INSTRUCTIONS  For Theresa Oman MD    Pt Name: Theresa Hobbs  Medical Record Number: [id number]  Today's Date: 02/09/2013    GENERAL ANESTHESIA OR SEDATION  1. Do not drive or operate hazardous machinery for 24 hours.  2. Do not make important business or personal decisions for 24 hours.  3. Do not drink alcoholic beverages or use tobacco for 24 hours.    ACTIVITY INSTRUCTIONS:  []  Rest today. Resume light to normal activity tomorrow.   []  You may resume normal activity tomorrow. Do not engage in strenuous activity that may place stress on your incision.  [x]  Do not drive for 3-5 days and avoid heavy lifting, tugging, pullings greater than 10 lbs until seen in the office.      DIET INSTRUCTIONS:  [] Begin with clear liquids. If not nauseated, may increase to a low-fat diet when you desire. Greasy and spicy foods are not advised.  [x] Regular diet as tolerated.  [] Other:     MEDICATIONS  [x] Prescription sent with you to be used as directed.   [] Lortab   [] Vicodin   [x] Percocet   [] Tylenol #3   [] Oxycontin   Do not drink alcohol or drive while taking these medications. You may experience dizziness or drowsiness with these medications. You may also experience constipation which can be relieved with stool softners or laxatives.  [x] You may resume your daily prescription medication schedule unless otherwise specified.  [x] Do not take 325mg  Aspirin or other blood thinners such as Coumadin or Plavix for 5 days.  Take the Valium as prescribed to control the abdominal muscle tightness.   Take the Colace as ordered to prevent constipation. Do not allow yourself to become constipated.   Take the Miralax as ordered to prevent constipation.    WOUND/DRESSING INSTRUCTIONS:  Always ensure you and your care giver clean hands before and after caring for the wound.  []  Keep dressing clean and dry for 48 hours. Change when soiled or wet.      []  Allow steri-strips to fall off on their own.   [x]  Ice operative  site for 20 minutes 4 times a day as needed.     [x]  May wash over incision in shower but do not soak in a bath. No lotions, powders or ointments to incision sites.   []  Take sitz bath for 20 minutes twice daily and after bowel movements.  [x]  Keep the abdominal binder in place during the day. May remove to shower and at night.  []  Remove packing from wound in 24 hours and replace with AMD dressings daily.  []  Empty JP drain daily and record the amounts.    ABDOMINAL/LAPAROSCOPIC SURGERY  [x] You are encouraged to get up and move around as this helps with circulation, prevents blood clots from forming and speeds up the healing process. Call the office if you develop pain or swelling in your legs. Do not massage sore muscles in the legs.  [x] Breath deeply and cough from time to time. This helps to clear your lungs and helps prevent pneumonia.  [x] Supporting your incision with a pillow or your hand helps to minimize discomfort and pain.  [] Laparoscopic patients may develop shoulder pain in the first 48 hours from the gas used during the procedure.    FOLLOW-UP CARE. SPECIFICALLY WATCH FOR:   Fever over 101 degrees by mouth   Increased redness, warmth, hardness at operative site.   Blood soaked dressing (small amounts of oozing may be normal.)   Increased  or progressive drainage from the surgical area   Inability to urinate or blood in the urine   Pain not relieved by the medications ordered   Persistent nausea and/or vomiting, unable to retain fluids.   Pain or swelling in your legs.   Shortness of breath.  Call the office if you develop any of the above symptoms.     FOLLOW-UP APPOINTMENT   [] 1 week   [x] 2 weeks; call the office to set an appointment to see Dr. Pat Patrick in 2 weeks.    [] Other    Call my office if you have any problem that concerns you [phone]. After hours, you can reach the answering service via the office phone number. IF YOU NEED IMMEDIATE ATTENTION, GO TO THE EMERGENCY ROOM AND YOUR DOCTOR  WILL BE CONTACTED.    Prepared by Autumn Messing L. Hempfling, CNP for  Celine Ahr MD  [address]  [place], [place] [address]  Electronically signed 02/09/2013 at 11:13 AM              Abdominal Hernia Repair: What to Expect at Home  Your Recovery  After surgery to repair your hernia, you are likely to have pain for a few days. You may also feel like you have the flu, and you may have a low fever and feel tired and nauseated. This is common.  You should feel better after a few days and will probably feel much better in 7 days.  For several weeks you may feel twinges or pulling in the hernia repair when you move. You may have some bruising around the area of your hernia repair. This is normal.  This care sheet gives you a general idea about how long it will take for you to recover. But each person recovers at a different pace. Follow the steps below to get better as quickly as possible.  How can you care for yourself at home?  Activity   Rest when you feel tired. Getting enough sleep will help you recover.   Try to walk each day. Start by walking a little more than you did the day before. Bit by bit, increase the amount you walk. Walking boosts blood flow and helps prevent pneumonia and constipation.   Put ice or a cold pack on the area of your hernia repair for 10 to 20 minutes at a time. Try to do this every 1 to 2 hours for the first 24 hours (when you are awake) or until the swelling goes down. Put a thin cloth between the ice and your skin.   If your doctor gives you an abdominal binder to wear, use it as directed. This is an elastic bandage that wraps around your belly and upper hips. It helps support your belly muscles after surgery.   Avoid strenuous activities, such as biking, jogging, weight lifting, or aerobic exercise, until your doctor says it is okay.   Avoid lifting anything that would make you strain. This may include heavy grocery bags and milk containers, a heavy briefcase or backpack, cat litter or  dog food bags, a vacuum cleaner, or a child.   Ask your doctor when you can drive again.   Most people are able to return to work within 1 to 2 weeks after surgery. But if your job requires that you to do heavy lifting or strenuous activity, you may need to take 4 to 6 weeks off from work.   You may shower 24 to 48 hours after  surgery, if your doctor okays it. Pat the cut (incision) dry. Do not take a bath for the first 2 weeks, or until your doctor tells you it is okay.   Ask your doctor when it is okay for you to have sex.  Diet   You can eat your normal diet. If your stomach is upset, try bland, low-fat foods like plain rice, broiled chicken, toast, and yogurt.   Drink plenty of fluids (unless your doctor tells you not to).   You may notice that your bowel movements are not regular right after your surgery. This is common. Avoid constipation and straining with bowel movements. You may want to take a fiber supplement every day. If you have not had a bowel movement after a couple of days, ask your doctor about taking a mild laxative.  Medicines   Take pain medicines exactly as directed.   If the doctor gave you a prescription medicine for pain, take it as prescribed.   If you are not taking a prescription pain medicine, ask your doctor if you can take an over-the-counter medicine.   If your doctor prescribed antibiotics, take them as directed. Do not stop taking them just because you feel better. You need to take the full course of antibiotics.   If you think your pain medicine is making you sick to your stomach:   Take your medicine after meals (unless your doctor has told you not to).   Ask your doctor for a different pain medicine.  Incision care   If you have strips of tape on the cut (incision) the doctor made, leave the tape on for a week or until it falls off. Or follow your doctor's instructions for removing the tape.   If you have staples closing the cut, you will need to visit your doctor  in 1 to 2 weeks to have them removed.   Wash the area daily with warm, soapy water, and pat it dry. Don't use hydrogen peroxide or alcohol, which can slow healing. You may cover the area with a gauze bandage if it weeps or rubs against clothing. Change the bandage every day.  Other instructions   Hold a pillow over your incision when you cough or take deep breaths. This will support your belly and decrease your pain.   Do breathing exercises at home as instructed by your doctor. This will help prevent pneumonia.   If you had laparoscopic surgery, you may also have pain in your left shoulder. The pain usually lasts about a day or two.  Follow-up care is a key part of your treatment and safety. Be sure to make and go to all appointments, and call your doctor if you are having problems. It's also a good idea to know your test results and keep a list of the medicines you take.  When should you call for help?  Call 911 anytime you think you may need emergency care. For example, call if:   You passed out (lost consciousness).   You have sudden chest pain and shortness of breath, or you cough up blood.   You have severe pain in your belly.  Call your doctor now or seek immediate medical care if:   You are sick to your stomach and cannot keep fluids down.   You have signs of a blood clot, such as:   Pain in your calf, back of the knee, thigh, or groin.   Redness and swelling in your leg or groin.  You have signs of infection, such as:   Increased pain, swelling, warmth, or redness.   Red streaks leading from the incision.   Pus draining from the incision.   A fever.   You have trouble passing urine or stool, especially if you have mild pain or swelling in your lower belly.   Bright red blood has soaked through the bandage over your incision.  Watch closely for changes in your health, and be sure to contact your doctor if:   Your swelling is getting worse.   Your swelling is not going down.   You still  don't have a bowel movement after taking a laxative.   Where can you learn more?   Go to [email or web address] and sign in to your MyChart account. Enter B577 in the Search Health Information box to learn more about "Abdominal Hernia Repair: What to Expect at Home."    If you do not have an account, please click on the "Sign Up Now" Riley Papin.      2006-2014 [employer]. Care instructions adapted under license by [facility]. This care instruction is for use with your licensed healthcare professional. If you have questions about a medical condition or this instruction, always ask your healthcare professional. [employer], [employer] disclaims any warranty or liability for your use of this information.  Content Version: 10.0.273164; Last Revised: July 21, 2011          No heavy lifting. Drink 8-10 glasses of water per day. May shower, no tub baths. Wash over incisions daily with liquid antibacterial soap and water. Observe incisions daily; looking for redness, swelling, warm to touch, or separation. Call doctor for temp greater than 101, increased pain, persistent nausea or vomiting, or decreased urination.       Directions for Incision Care:    Keep incision clean and dry   Apply dry dressing as needed   May shower and wash incision with soap and water   NO tub baths   Do not apply any lotions or powders

## 2013-02-09 NOTE — Progress Notes (Signed)
Pt to lobby for discharge via w/c

## 2013-02-09 NOTE — Progress Notes (Signed)
Patient calls out frequently on call light forgetting we had just talked. Patient dozes during conversations.  Patient is consoled by Clinical research associatewriter and falls immediately back to sleep.  Patient is reassured that pain medication will be given promptly when due if needed.  Patient satisfied.

## 2013-02-09 NOTE — Progress Notes (Signed)
[facility]  General Surgery - Dr. Sharla Kidney  Postoperative Progress Note for Dr. Pat Patrick    Pt Name: Theresa Hobbs  Medical Record Number: [id number]  Date of Birth 04-17-1966   Today's Date: 02/09/2013    Chief Complaint: "Feel good - want to go home today".    SUBJECTIVE: Nursing reports doing better today; has been up to BR but reluctant to walk in hall today. No N/V; tolerating general diet; passing flatus in large amounts but no BM; voiding without problem. Pain controlled - wanting the IV Dilaudid - working to use po pain meds only. Requests Percocet rather than Norco - "works better for me". Up in room and hall - tolerating well.     OBJECTIVE  VITALS: VITALS:  BP 132/75   Pulse 83   Temp(Src) 98.1 ??F (36.7 ??C) (Oral)   Resp 20   Ht 5\' 2"  (1.575 m)   Wt 280 lb 6.8 oz (127.2 kg)   BMI 51.28 kg/m2   SpO2 94% RA    GENERAL: resting in bed, alert, cooperative; NAD; talkative and pleasant  LUNGS: clear to ausculation, without wheezes, rales or rhonci  HEART: S & R, S1,S2 no murmurs, gallops or rub; peripheral pulses 2+; no peripheral edema  ABDOMEN: Obese, normal tone bowel sounds, non-distended, soft, expected incisional tenderness; no guarding  INCISION: right lateral abdomen, sub-umbilical and right groin Incision edges well approximated without erythema/drainage. No hyperthermia, induration or fluctuance. Staples intact.   SKIN: pale pink, warm, dry  Neuro: no acute deficits    INTAKE/OUTPUT :   INTAKE/OUTPUT:    I/O last 3 completed shifts:  In: 3704.9 [P.O.:700; I.V.:3004.9]  Out: 1325 [Urine:1325]      Intake/Output Summary (Last 24 hours) at 02/09/13 1025  Last data filed at 02/09/13 0445   Gross per 24 hour   Intake 3704.89 ml   Output   1325 ml   Net 2379.89 ml      LABS:   Results for SANELA, EVOLA (MRN [id number]) as of 02/09/2013 10:31   Ref. Range 02/03/2013 12:08 02/08/2013 12:20 02/09/2013 05:53   WBC Latest Range: 4.8-10.8 thou/mm3 8.8 12.5 (H) 12.4 (H)   RBC Latest Range: 4.20-5.40 mill/mm3  4.66 4.06 (L) 3.72 (L)   Hemoglobin Quant Latest Range: 12.0-16.0 gm/dl 78.2 95.6 21.3 (L)   Hematocrit Latest Range: 37.0-47.0 % 43.4 37.9 35.4 (L)   MCV Latest Range: 81.0-99.0 fL 93.1 93.4 95.0   MCH Latest Range: 27.0-31.0 pg 31.2 (H) 32.2 (H) 31.7 (H)   MCHC Latest Range: 33.0-37.0 gm/dl 08.6 57.8 46.9   MPV Latest Range: 7.4-10.4 mcm 9.8 9.4 9.0   RDW Latest Range: 11.5-14.5 % 15.6 (H) 14.8 (H) 14.6 (H)   Platelet Count Latest Range: 130-400 thou/mm3 249 209 182   Seg Neutrophils No range found 53.8     Segs Absolute Latest Range: 1.8-7.7 thou/mm3 4.7     Lymphocytes No range found 37.4     Lymphocytes Absolute Latest Range: 1.0-4.8 thou/mm3 3.3     Monocytes No range found 5.9     Monocytes Absolute Latest Range: 0.4-1.3 thou/mm3 0.5     Eosinophils No range found 2.3     Eosinophils Absolute Latest Range: 0.0-0.4 thou/mm3 0.2     Basophils No range found 0.6     Basophils Absolute Latest Range: 0.0-0.1 thou/mm3 0.1     Nucleated Red Blood Cells No range found 0     RBC Morphology No range found NORMAL  Anisocytosis No range found 1       Results for CHARMELLE, SOH (MRN [id number]) as of 02/09/2013 10:31   Ref. Range 02/03/2013 12:08 02/07/2013 10:36 02/09/2013 05:53   Sodium Latest Range: 135-145 meq/l 142  137   Potassium Latest Range: 3.5-5.2 meq/l 3.8 3.8 3.9   Chloride Latest Range: 98-111 meq/l 104  102   CO2 Latest Range: 23-33 meq/l 27  28   Glucose Latest Range: 70-108 mg/dl 409  811 (H)   BUN Latest Range: 7-22 mg/dl 10  7   Creatinine Latest Range: 0.4-1.2 mg/dl 0.9  1.1   Alk Phos Latest Range: 38-126 U/L 153 (H)     Calcium Latest Range: 8.5-10.5 mg/dl 9.4  8.3 (L)   Est, Glom Filt Rate No range found 67 (A)  53 (A)     Results for THIRZA, PELLICANO (MRN [id number]) as of 02/09/2013 10:31   Ref. Range 02/08/2013 08:42   Troponin T No range found < 0.010     02/08/13: Blood cultures neg x 2    Results for YOMIRA, FLITTON (MRN [id number]) as of 02/09/2013 10:31   Ref. Range 02/08/2013 12:45   Bacteria, UA Latest Range:  FEW/NONE S  NONE   Blood, Urine Latest Range: NEGATIVE  MODERATE (A)   Casts Latest Range: NONE SEEN /lpf NONE SEEN   Character, Urine Latest Range: CLR-SL.CLO  CLOUDY (A)   Crystals Latest Range: NONE SEEN  NONE SEEN   Epi Cells Latest Range: 3-5/hpf /hpf 0   Nitrite, Urine Latest Range: NEGATIVE  NEGATIVE   pH, UA Latest Range: 5.0-9.0  6.0   Protein, UA Latest Range: NEGATIVE mg/dl NEGATIVE   RBC, UA Latest Range: 0-2/hpf /hpf 0   Renal Epithel, UA Latest Range: NONE SEEN  NONE SEEN   Specific Gravity, UA Latest Range: 1.002-1.03  1.017   WBC, UA Latest Range: 0-4/hpf /hpf 0   Yeast, Urine Latest Range: NONE SEEN  NONE SEEN   Leukocytes, UA Latest Range: NEGATIVE  NEGATIVE   Color, UA Latest Range: YELLOW-STR  YELLOW   Urobilinogen, Urine Latest Range: 0.0-1.0 eu/dl 1.0     Radiology studies:  Chest Mobile Single 02/08/2013 12:25:00 PM   REPORT MOBILE FRONTAL UPRIGHT VIEW OF THE CHEST   CLINICAL INFORMATION: Fever. History of surgery.   RESULTS:   1. No gross consolidation, pneumothorax or pulmonary edema. No acute pathology of the chest.   2. Stable cardiac silhouette. No displaced rib fracture.   FINAL REPORT  Dictated By : Rushie Goltz A. M.D.    ASSESSMENT  1. POD # 2 repair of 3 abdominal wall hernias by Dr. Pat Patrick  2. GERD  3. Nausea  4. VSS; T max 100.9 (102.9 x 1 yesterday)   - all cultures negative; chest film negative  5. Leukocytosis with WBC stable 12.4; (12.5)   - Ancef continues  6. Lytes stable   7. IVF at 125 cc/hr   8. UOP ok  9. General diet - good po intake - no N/V  10. Pain control  11. Incision  12. Prophylaxis - Lovenox, Prilosec, SCDs, IS, mobilize  PLAN  1. Decrease IVF  2. Change Norco to Percocet  3. Continue general diet  4.   Routine wound care  5.   Encourage OOB to chair and ambulate  6.   OK to d/c to home later today if remains afebrile, tolerating diet, passing flatus, pain controlled with po meds  Gerri L. Hempfling, CNP  Electronically  signed 02/09/2013 at 10:25  AM    Patient seen and examined independently by me. Above discussed and I agree with Gerri Hempfling, CNP. See my additional comments below for updated orders and plan. Labs, cultures, and radiographs where available were reviewed.  I discussed patient concerns with the patient's nurse and instructions were given. Please see our orders for the updated patient care plan.  - tolerating diet  - Pain control much improved  - Incisions looks good.  - Fever spikes resolving  - ambulating  - Bowel function returning  - Home today  - Follow up with Dr. Pat Patrick in office.    Sharalyn Ink. Mayari Matus, M.D.  Electronically signed 02/09/2013 at 1:30 PM

## 2013-02-12 NOTE — Telephone Encounter (Signed)
Per Dr. Merita Norton ok to call in norco 5-325 1 tab every 4 hours prn pain. 40 tabs. o refills. Called into [facility] [place]. Spoke to [place] then to [place] the Teacher, early years/pre.

## 2013-02-12 NOTE — Telephone Encounter (Signed)
Patient called back in requesting pain medication other than percocet. Please advise.

## 2013-02-12 NOTE — Telephone Encounter (Signed)
Pt is calling in stating that she would like something else for her pain. She states that she cannot take the percocet. She states that it gives her an upset stomach. Pt would like some norco.     Please advise

## 2013-02-13 NOTE — Telephone Encounter (Signed)
ok 

## 2013-02-14 LAB — CULTURE BLOOD #1
Blood Culture, Routine: NO GROWTH
Blood Culture, Routine: NO GROWTH

## 2013-02-17 MED ORDER — HYDROCODONE-ACETAMINOPHEN 10-325 MG PO TABS
10-325 MG | ORAL_TABLET | Freq: Four times a day (QID) | ORAL | Status: DC | PRN
Start: 2013-02-17 — End: 2013-02-22

## 2013-02-17 MED ORDER — ONDANSETRON HCL 4 MG PO TABS
4 MG | ORAL_TABLET | Freq: Four times a day (QID) | ORAL | Status: DC | PRN
Start: 2013-02-17 — End: 2013-03-25

## 2013-02-17 MED ORDER — DOXYCYCLINE HYCLATE 100 MG PO CAPS
100 MG | ORAL_CAPSULE | Freq: Two times a day (BID) | ORAL | Status: AC
Start: 2013-02-17 — End: 2013-02-24

## 2013-02-17 NOTE — Telephone Encounter (Signed)
K, thanks

## 2013-02-17 NOTE — Telephone Encounter (Signed)
Patient called office states she has been vomiting since yesterday. Patient states her pain is not controlled but it is not bothering her. Patient is eating regular diet. Patient is not taking any nausea medication. Patient states her incision at her umbilicus looks infected. It is red and warm to touch. Her other two incisions look good. Patient does not really want to go to ED but is very worried.     Per Dr. Sharla Kidney start patient on doxycycline 100mg  1 tab bid x 7 days. zofran 4 mg 1 tab every 6 hours prn nausea. 30 tabs. o refills. Norco 10-325- 1 tab every 6 hours prn pain. 20 tabs. o refills. patient is aware. Reinforced to patient that if anything worsens go to ED. Called into [facility] [place].  Left on voicemail.

## 2013-02-18 NOTE — Telephone Encounter (Signed)
Attempted to call and check on patient due to her calling yesterday around 415pm. Left message.

## 2013-02-21 ENCOUNTER — Inpatient Hospital Stay
Admit: 2013-02-21 | Discharge: 2013-02-21 | Disposition: A | Discharge status: Home / Self Care | Attending: Emergency Medicine

## 2013-02-21 LAB — CBC WITH AUTO DIFFERENTIAL
Anisocytosis: 1
Basophils Absolute: 0.1 10*3/uL (ref 0.0–0.1)
Basophils: 0.6 %
Eosinophils Absolute: 0.2 10*3/uL (ref 0.0–0.4)
Eosinophils: 1.8 %
Hematocrit: 41.5 % (ref 37.0–47.0)
Hemoglobin: 14 gm/dl (ref 12.0–16.0)
Lymphocytes Absolute: 3.1 10*3/uL (ref 1.0–4.8)
Lymphocytes: 30 %
MCH: 31.3 pg — ABNORMAL HIGH (ref 27.0–31.0)
MCHC: 33.8 gm/dl (ref 33.0–37.0)
MCV: 92.5 fL (ref 81.0–99.0)
MPV: 8.9 mcm (ref 7.4–10.4)
Monocytes Absolute: 0.6 10*3/uL (ref 0.4–1.3)
Monocytes: 5.9 %
Platelets: 320 10*3/uL (ref 130–400)
RBC Morphology: NORMAL
RBC: 4.48 10*6/uL (ref 4.20–5.40)
RDW: 14.9 % — ABNORMAL HIGH (ref 11.5–14.5)
Seg Neutrophils: 61.7 %
Segs Absolute: 6.4 10*3/uL (ref 1.8–7.7)
WBC: 10.4 10*3/uL (ref 4.8–10.8)
nRBC: 0 /100 wbc

## 2013-02-21 LAB — BASIC METABOLIC PANEL
BUN: 18 mg/dl (ref 7–22)
CO2: 29 meq/l (ref 23–33)
Calcium: 9.6 mg/dl (ref 8.5–10.5)
Chloride: 104 meq/l (ref 98–111)
Creatinine: 1.1 mg/dl (ref 0.4–1.2)
Glucose: 95 mg/dl (ref 70–108)
Potassium: 4 meq/l (ref 3.5–5.2)
Sodium: 142 meq/l (ref 135–145)

## 2013-02-21 LAB — OSMOLALITY: Osmolality Calc: 284.8 mOsmol/kg (ref 275.0–300)

## 2013-02-21 LAB — ANION GAP: Anion Gap: 13 (ref 10.0–20.0)

## 2013-02-21 LAB — GLOMERULAR FILTRATION RATE, ESTIMATED: Est, Glom Filt Rate: 53 mL/min/{1.73_m2} — AB

## 2013-02-21 MED ORDER — OXYCODONE-ACETAMINOPHEN 5-325 MG PO TABS
5-325 MG | ORAL_TABLET | ORAL | Status: AC | PRN
Start: 2013-02-21 — End: 2013-02-28

## 2013-02-21 MED ADMIN — HYDROmorphone HCl PF (DILAUDID) injection SOLN 1 mg: INTRAVENOUS | @ 20:00:00 | NDC 00409128310

## 2013-02-21 MED ADMIN — ceFAZolin 1 gram (ANCEF) IVPB: INTRAVENOUS | @ 22:00:00 | NDC 00338350341

## 2013-02-21 MED ADMIN — morphine (PF) 4 MG/ML injection: INTRAVENOUS | @ 21:00:00 | NDC 00409189101

## 2013-02-21 MED ADMIN — ondansetron (ZOFRAN) injection 4 mg: INTRAVENOUS | @ 20:00:00 | NDC 00143989105

## 2013-02-21 MED ADMIN — HYDROmorphone HCl PF (DILAUDID) injection SOLN 1 mg: INTRAVENOUS | @ 23:00:00 | NDC 00409128310

## 2013-02-21 MED FILL — CEFAZOLIN SODIUM 1-5 GM-% IV SOLN: 1-5 GM-% | INTRAVENOUS | Qty: 50

## 2013-02-21 MED FILL — HYDROMORPHONE HCL PF 1 MG/ML IJ SOLN: 1 MG/ML | INTRAMUSCULAR | Qty: 1

## 2013-02-21 MED FILL — MORPHINE SULFATE (PF) 4 MG/ML IV SOLN: 4 mg/mL | INTRAVENOUS | Qty: 1

## 2013-02-21 MED FILL — ONDANSETRON HCL 4 MG/2ML IJ SOLN: 4 MG/2ML | INTRAMUSCULAR | Qty: 2

## 2013-02-21 NOTE — ED Provider Notes (Signed)
[facility]  eMERGENCY dEPARTMENT eNCOUnter          CHIEF COMPLAINT       Chief Complaint   Patient presents with   ??? Wound Infection       Nurses Notes reviewed and I agree except as noted in the HPI.      HISTORY OF PRESENT ILLNESS    Theresa Hobbs is a 47 y.o. female who presents to the Emergency Department with      Location/Symptom: abdominal pain at surgical incision sites  Timing/Onset: ongoing since surgery, gradual worsening over past 2 days  Context/Setting: gradual worsening over past 2 days  Quality: sharp, aching  Duration: continuous  Modifying Factors: palpation of the incision site worsens pain  Severity:  9/10     REVIEW OF SYSTEMS     Review of Systems   Constitutional: Positive for fever (reported fever at home). Negative for chills, activity change, appetite change and fatigue.   HENT: Negative for ear pain, neck pain and sinus pressure.    Eyes: Negative for pain.   Respiratory: Negative for cough, chest tightness, shortness of breath and wheezing.    Cardiovascular: Negative for chest pain and palpitations.   Gastrointestinal: Positive for abdominal pain (over 3 surgical incision sites). Negative for nausea, vomiting, diarrhea and constipation.   Endocrine: Negative for cold intolerance, heat intolerance, polydipsia, polyphagia and polyuria.   Genitourinary: Negative for dysuria, urgency, frequency, flank pain, vaginal pain and pelvic pain.   Musculoskeletal: Negative for myalgias, back pain and arthralgias.   Skin: Positive for color change (redness at surgical incision sites) and wound (surgical incision sites x 3 on abdomen). Negative for pallor and rash.   Neurological: Negative for dizziness, weakness, light-headedness, numbness and headaches.   Hematological: Negative for adenopathy.           PAST MEDICAL HISTORY    has a past medical history of Cancer (HCC); Chronic kidney disease (HCC); Psychiatric problem; Seizures (HCC); Lupus (HCC); Depression; Anxiety;  Fibromyalgia; Post traumatic stress disorder (PTSD); HTN (hypertension); and Diabetes mellitus (HCC).    SURGICAL HISTORY      has past surgical history that includes Cholecystectomy (2000); Nose surgery (2007); Hysterectomy (1998); Colonoscopy (2004); Upper gastrointestinal endoscopy (2004); and Abdominal hernia repair (02-07-13).    CURRENT MEDICATIONS       Previous Medications    ARIPIPRAZOLE (ABILIFY) 10 MG TABLET    Take 10 mg by mouth daily.    DIAZEPAM (VALIUM) 5 MG TABLET    Take 5 mg by mouth every 6 hours as needed.      DIAZEPAM (VALIUM) 5 MG TABLET    Take 1 tablet by mouth every 6 hours as needed (Abdominal muscle tightness) for 40 doses.    DOCUSATE SODIUM (COLACE, DULCOLAX) 100 MG CAPS    Take 100 mg by mouth 2 times daily as needed for Constipation (Take to prevent constipation) for 60 doses.    DOXYCYCLINE (VIBRAMYCIN) 100 MG CAPSULE    Take 1 capsule by mouth 2 times daily for 7 days.    HYDROCODONE-ACETAMINOPHEN (NORCO) 10-325 MG PER TABLET    Take 1 tablet by mouth every 6 hours as needed for Pain for 20 doses.    LEVETIRACETAM (KEPPRA PO)    Take 500 mg by mouth 2 times daily.    LISINOPRIL (PRINIVIL;ZESTRIL) 20 MG TABLET    Take 20 mg by mouth daily.    METFORMIN (GLUCOPHAGE) 500 MG TABLET    Take 500  mg by mouth 2 times daily (with meals).    METOPROLOL (LOPRESSOR) 25 MG TABLET    Take 25 mg by mouth 2 times daily.    OMEPRAZOLE (PRILOSEC) 20 MG CAPSULE    Take 40 mg by mouth 2 times daily (before meals).    ONDANSETRON (ZOFRAN ODT) 4 MG DISINTEGRATING TABLET    Take 1 tablet by mouth every 8 hours as needed for Nausea for 15 doses.    ONDANSETRON (ZOFRAN) 4 MG TABLET    Take 1 tablet by mouth every 6 hours as needed for Nausea or Vomiting for 30 doses.    PHENYTOIN (DILANTIN) 100 MG ER CAPSULE    Take 100 mg by mouth 3 times daily. May increase up to 5 100 mg tablets if needed (depending on dilantin level)    POLYETHYLENE GLYCOL (GLYCOLAX) PACKET    Take 17 g by mouth daily as needed (Take to  prevent constipation) for 30 doses.    VENLAFAXINE (EFFEXOR-XR) 150 MG XR CAPSULE    Take 150 mg by mouth daily.    ZOLPIDEM (AMBIEN) 10 MG TABLET    Take 10 mg by mouth nightly as needed.       ALLERGIES     is allergic to imitrex; iv dye; amoxicillin-pot clavulanate; azithromycin; bactrim; cefuroxime axetil; ciprofloxacin; erythromycin; flagyl; ketorolac tromethamine; klonopin; levofloxacin; nubain; other; pcn; reglan; sulfa antibiotics; and tramadol.    FAMILY HISTORY     is adopted. family history includes Cancer in her father and mother and Osteoporosis in her mother. She is adopted.    SOCIAL HISTORY      reports that she has quit smoking. Her smoking use included Cigarettes. She has a 1.5 pack-year smoking history. She has never used smokeless tobacco. She reports that she does not drink alcohol or use illicit drugs.    PHYSICAL EXAM     INITIAL VITALS:  height is 5\' 2"  (1.575 m) and weight is 280 lb (127.007 kg). Her oral temperature is 98.7 ??F (37.1 ??C). Her blood pressure is 139/68 and her pulse is 96. Her respiration is 16 and oxygen saturation is 98%.    Physical Exam   Nursing note and vitals reviewed.  Constitutional: She appears well-developed and well-nourished.   HENT:   Head: Normocephalic and atraumatic.   Right Ear: External ear normal.   Left Ear: External ear normal.   Mouth/Throat: Oropharynx is clear and moist. No oropharyngeal exudate.   Eyes: Conjunctivae and EOM are normal. Pupils are equal, round, and reactive to light. Right eye exhibits no discharge. Left eye exhibits no discharge.   Neck: Normal range of motion. Neck supple. No thyromegaly present.   Cardiovascular: Normal rate, regular rhythm and intact distal pulses.    Pulmonary/Chest: Effort normal and breath sounds normal.   Abdominal: Bowel sounds are normal. She exhibits no distension and no mass. There is tenderness. There is no rebound and no CVA tenderness. No hernia.       Musculoskeletal: Normal range of motion. She  exhibits no edema and no tenderness.   Neurological: She is alert. No cranial nerve deficit.   Skin: Skin is warm and dry. No rash noted. She is not diaphoretic. There is erythema. No pallor.   Psychiatric: She has a normal mood and affect. Her behavior is normal.         DIFFERENTIAL DIAGNOSIS:   Surgical site infection  Wound dehiscence  Infection      DIAGNOSTIC RESULTS     EKG: All  EKG's are interpreted by the Emergency Department Physician who either signs or Co-signs this chart in the absence of a cardiologist.  none    RADIOLOGY: non-plain film images(s) such as CT, Ultrasound and MRI are read by the radiologist.  None    LABS:   Labs Reviewed   CBC WITH AUTO DIFFERENTIAL - Abnormal; Notable for the following:     MCH 31.3 (*)     RDW 14.9 (*)     All other components within normal limits   GLOMERULAR FILTRATION RATE, ESTIMATED - Abnormal; Notable for the following:     Est, Glom Filt Rate 53 (*)     All other components within normal limits   CULTURE BLOOD #1   CULTURE BLOOD #2   AEROBIC CULTURE   BASIC METABOLIC PANEL   ANION GAP   OSMOLALITY       EMERGENCY DEPARTMENT COURSE:   Vitals:    Filed Vitals:    02/21/13 1503 02/21/13 1537 02/21/13 1710 02/21/13 1715   BP: 133/47 136/90 183/91 139/68   Pulse:  96 100 96   Temp:       TempSrc:       Resp:  16 18 16    Height:       Weight:       SpO2:  98% 97% 98%     This patient was seen by myself and in conjunction with Dr. Thomasena Edis. The patient presents to the emergency department with complaints of abdominal pan at and around her surgical incision sites.  Patient is also reporting a fever at home as well as redness to the incision sites. Patient states that she contacted her surgeon and he informed her to present to the emergency department for further evaluation and treatment. Patient is a diabetic and states that she doe shave some history of being a slow healer due to this fact.  Labs were obtained and resulted as indicated above.  There is no palpable fluid  collection at the surgical incision sites.  Patient labs were returned with reassuring results.  A call was placed to Dr. Shirlean Schlein, on call for Dr. Pat Patrick and we discussed giving the patient a dose of antibiotics and pain medication and having the patient follow-up with Dr. Pat Patrick next week.  The patient  was in agreement with this plan of care and discharged home in stable condition.     CRITICAL CARE:   None    CONSULTS:  Dr. Pat Patrick, surgeon    PROCEDURES:  None    FINAL IMPRESSION      1. Post-op pain          DISPOSITION/PLAN   Discharge to home    PATIENT REFERRED TO:  Lequita Halt, MD  [address]  [address]  [place] [address]  [phone]    Call in 3 days        DISCHARGE MEDICATIONS:  New Prescriptions    OXYCODONE-ACETAMINOPHEN (PERCOCET) 5-325 MG PER TABLET    Take 1 tablet by mouth every 4 hours as needed for Pain for 7 days.       (Please note that portions of this note were completed with a voice recognition program.  Efforts were made to edit the dictations but occasionally words are mis-transcribed.)    Molli Hazard, CNP            Molli Hazard, CNP  02/21/13 1851

## 2013-02-21 NOTE — ED Notes (Signed)
Presents to ED regarding concern for infected surgical incisions. PT has three incision sites to abdomen with staples placed. Incision site to right side lower abdomen has lost staples. All three incision sites have redness surrounded. Afebrile. Scant amount yellow drainage noted to dressings placed by pt at home. Francee Piccolo, CNP, at bedside for evaluation. Side rails x2. Call light within reach. Will continue to monitor.     Reece Packer, RN  02/21/13 1517

## 2013-02-21 NOTE — Discharge Instructions (Signed)
Pain, Taking Care of Your Pain at Home  Taking care of your pain is important to your health. Your doctor has prescribed pain medicine for you to use at home. It may not be possible to make all of your pain go away, but you should be comfortable enough to move, breathe and take care of yourself.  WHEN TAKING PAIN MEDICINE:   Do not drink alcohol (beer, wine, liquor) while using pain medicine.    Take only the medicine that your doctor tells you to take.    Take your pain medicine on time if you have pain most of the day.    Do not skip doses of your pain medicine or take more than your doctor tells you to take.    Take pain medicine with some food to keep from having an upset stomach.   TYPES OF PAIN MEDICINES   Opioids    These are strong medicines used for bad to very bad pain.    You must have a prescription to get these medicines.    Take these medicines with a snack and plenty of water.   These medicines may cause side-effects such as:    Feeling tired or dizzy.    Feeling sick to your stomach.    Throwing up.    Itching.   Tell your doctor if you have any of these side-effects and they do not get better. You may be having an allergic reaction and need to switch to a different medicine.  A common side effect of opioids is constipation. Constipation is poop that is small, hard and dry and hurt to pass). You may need to take a laxative. Take these medicines the way it tells you to on the label. It also helps if you eat fruits, vegetables and cereals. Drink plenty of water also.   Non-opioids    These medicines may be used alone for mild to moderate pain, or they may be taken along with opioids to boost pain relief. Some will also lessen swelling.    Do not take more than the dose listed on the label.    Take these medicines with a snack and plenty of water to avoid an upset stomach.    These medicines may cause you to have sticky, black stools.   OTHER WAYS TO LESSEN PAIN   You may try some of  these helpful ways to lessen stress and pain. They may make your pain medicine work better.    Ice or Heat    A bag of frozen peas wrapped in a thin cloth makes a good cold pack.    A moist washcloth in a plastic bag warmed for a few seconds in a microwave makes a good warm pack.    Leave the cold or warm pack on for 20 minutes, then off for 20 minutes.    You will know whether ice or heat is best for your pain depending upon how you feel after trying both.    Deep Breathing    This will help relax your whole body.    Breathe in slowly and deeply through your nose as you count to 5.    Hold your breath for a couple of seconds.    Breathe out slowly through your nose.    Distraction (Doing Something Else)    Take your mind off your pain by doing something you enjoy. Talk with friends and family. Listen to music, watch a movie, do hand work, read, meditate   or pray.    Total Muscle Relaxation    Close your eyes. Tighten your foot muscles.    Hold for a few seconds. Then relax the muscles.    Now tighten the muscles of your calves. Hold. Relax.    Work slowly up your body in this way.    Imagery    Close your eyes. Breathe deeply. Picture yourself in a quiet, peaceful place.    Imagine how you feel in that place.    If other thoughts enter your mind, take a deep breath and try again.    Exercise    When your doctor says you are able to, you may begin to exercise and stretch your muscles. Exercise helps lessen the risk of getting a blood clot, keeps you limber and strong, and produces a positive mood.   TELL YOUR DOCTOR IF YOU HAVE:   Pain that does not get better, even after taking your pain medicine the way you were told to.    Feeling sick to your stomach and throwing up, watery poop, stomach cramps, or dry, hard poop that hurts to pass.    Dizziness or feeling over-tired after taking your pain medicine.   GET HELP RIGHT AWAY IF YOU HAVE:   Trouble breathing, bad itching, or a rash after  taking your pain medicine.   Easy-to-Read style based on content from [facility], [place], [place]  Document Released: 08/09/2009 Document Re-Released: 09/12/2009  [employer] Patient Information 2012 [employer].

## 2013-02-21 NOTE — ED Notes (Signed)
Bed:011A<BR> Expected date:<BR> Expected time:<BR> Means of arrival:<BR> Comments:<BR>

## 2013-02-21 NOTE — Telephone Encounter (Signed)
Patient had 3 hernias repaired on 02-07-13.  Her right lower quad incision has become infected and about 1/2 staples have fallen out.  The area is reddened with pus coming from it.  Patient has been running a fever since 02-16-13.  She is taking doxycycline which does not seem to be doing any good.  The prescribed Norco is not helping with the pain.  Joi wants to know if she should just go to the ER.   Spoke to Dr. Pat Patrick who is currently doing surgery.  He suggested patient be seen in the ER if   she desires.  She has many allergies, and it may be difficulty finding an antibiotic she can take to correct the infection.  Relayed this information to the patient.  She verbalized understanding and denies any further needs.

## 2013-02-21 NOTE — ED Notes (Signed)
Assessment complete.  Pt complain for wound infections status post hernia surgery on 02/07/13.  Complains of fever and chills also.      Theora Gianotti, RN  02/21/13 1505

## 2013-02-21 NOTE — ED Provider Notes (Signed)
I have personally performed And participated In the history, exam and medical decision-making and agreed with all pertinent clinical information.This encounter was performed Face to [facility].  I have also reviewed and agree with past medical History, family history and social history unless otherwise noted.    History and Physical: patient seen and evaluated for abdominal pain status post hernia surgery.  We did discuss the case with Dr.Wisser he and the patient okay with IV antibiotics here in the department, continue doxycycline as an outpatient and followup with Dr.Hixenbaugh    Clent Jacks, MD  02/21/13 1820

## 2013-02-21 NOTE — ED Notes (Addendum)
PT remains with pain despite Dilaudid and Morphine medications. PT states she is grumpy and in pain. Pt requests to speak to physician. Francee Piccolo, CNP notified. Side rails x2. Call light within reach. Will continue to monitor.     Reece Packer, RN  02/21/13 1832    Reece Packer, RN  02/21/13 [phone]

## 2013-02-22 LAB — CULTURE, AEROBIC

## 2013-02-22 MED ORDER — HYDROCODONE-ACETAMINOPHEN 10-325 MG PO TABS
10-325 MG | ORAL_TABLET | Freq: Four times a day (QID) | ORAL | Status: DC | PRN
Start: 2013-02-22 — End: 2013-02-26

## 2013-02-22 MED ORDER — CLINDAMYCIN HCL 300 MG PO CAPS
300 MG | ORAL_CAPSULE | Freq: Three times a day (TID) | ORAL | Status: AC
Start: 2013-02-22 — End: 2013-03-04

## 2013-02-22 NOTE — Progress Notes (Signed)
This patient called in crying.  She was seen last night by Dr. Shirlean Schlein in the ER.  She had hernia repairs done by Dr. Pat Patrick 02/07/13 and has redness and pain and fever to 102.  She states cultures were obtained last night and she was given a dose of Ancef.  She states she was given percocet which she can not take because it hurts her stomach.  I offered to admit her.  She requested Norco 10/325mg , which is the only thing that works for her.  She completed a course of doxycycline.    I will prescribe Norco 10 and clindamycin and asked her to call if worse.  She will need to be seen in the office and I have asked her to call on Monday to make an appointment.     Moody Bruins, MD

## 2013-02-25 NOTE — Telephone Encounter (Signed)
Attempted to call patient due to her not showing for appointment this am. Unable to leave message due to voicemail being full.

## 2013-02-25 NOTE — Progress Notes (Signed)
Subjective:      Patient ID: Theresa Hobbs is a 47 y.o. female.    HPI    Review of Systems    Objective:   Physical Exam    Assessment:      No show         Plan:

## 2013-02-26 MED ORDER — HYDROCODONE-ACETAMINOPHEN 10-325 MG PO TABS
10-325 MG | ORAL_TABLET | Freq: Every evening | ORAL | Status: DC
Start: 2013-02-26 — End: 2013-03-11

## 2013-02-26 MED ORDER — CLINDAMYCIN HCL 150 MG PO CAPS
150 MG | ORAL_CAPSULE | Freq: Three times a day (TID) | ORAL | Status: DC
Start: 2013-02-26 — End: 2013-03-11

## 2013-02-27 LAB — CULTURE, BLOOD 2: Blood Culture, Routine: NO GROWTH

## 2013-02-27 LAB — CULTURE BLOOD #1: Blood Culture, Routine: NO GROWTH

## 2013-02-27 MED ORDER — HYDROCODONE-ACETAMINOPHEN 10-325 MG PO TABS
10-325 MG | ORAL_TABLET | Freq: Four times a day (QID) | ORAL | Status: DC | PRN
Start: 2013-02-27 — End: 2013-03-11

## 2013-02-27 NOTE — Telephone Encounter (Signed)
Patient called to the office stating that she received a prescription for Norco from Dr. Pat Patrick yesterday while at the office.  The bottle states that she can only take one tablet at bedtime as needed for pain.  The pharmacy gave her 30 tabs of the 50 because that is all her insurance would cover since the medication was written for at bedtime only.  Per verbal order of Dr. Pat Patrick, corrected the medication to read: Norco 10/325 mg take one tablet every 6 hours PRN pain.  50 tabs. No refill.  Notified [facility] [phone] of this information.  The pharmacist will fill the additional 20 tabs and make the correction with dosing.

## 2013-03-04 NOTE — Progress Notes (Signed)
Subjective:      Patient ID: Theresa Hobbs is a 47 y.o. female.  Chief Complaint   Patient presents with   ??? Post-Op Check     S/p right abdominal wall hernia repair, umbilical hernia repair, and right inguinal hernia repair on 02-07-13.  Patient has a staph infection        HPIAs above patient had her ureter x3 developed some drainage and went to the emergency room cultures were performed showing MRSA. Patient did go to [facility] emergency room s he is complaining of pain in all 3 areas    Review of Systems   Constitutional: Positive for activity change and appetite change. Negative for fever, chills, diaphoresis, fatigue and unexpected weight change.   HENT: Negative for hearing loss, ear pain, nosebleeds, congestion, sore throat, facial swelling, rhinorrhea, sneezing, drooling, mouth sores, trouble swallowing, neck pain, neck stiffness, dental problem, voice change, postnasal drip, sinus pressure, tinnitus and ear discharge.    Eyes: Negative for photophobia, pain, discharge, redness, itching and visual disturbance.   Respiratory: Negative for apnea, cough, choking, chest tightness, shortness of breath, wheezing and stridor.    Cardiovascular: Negative for chest pain, palpitations and leg swelling.   Gastrointestinal: Positive for nausea, vomiting, abdominal pain, diarrhea and abdominal distention. Negative for constipation, blood in stool, anal bleeding and rectal pain.   Genitourinary: Negative for dysuria, urgency, frequency, hematuria, flank pain, decreased urine volume, vaginal bleeding, vaginal discharge, enuresis, difficulty urinating, genital sores, vaginal pain, menstrual problem, pelvic pain and dyspareunia.   Musculoskeletal: Negative for myalgias, back pain, joint swelling, arthralgias and gait problem.   Skin: Negative for color change, pallor, rash and wound.   Neurological: Negative for dizziness, tremors, seizures, syncope, facial asymmetry, speech difficulty, weakness, light-headedness, numbness and  headaches.   Hematological: Negative for adenopathy. Does not bruise/bleed easily.   Psychiatric/Behavioral: Negative for suicidal ideas, hallucinations, behavioral problems, confusion, sleep disturbance, self-injury, dysphoric mood, decreased concentration and agitation. The patient is nervous/anxious. The patient is not hyperactive.      Blood pressure 140/82, pulse 80, temperature 98.9 ??F (37.2 ??C), temperature source Oral, resp. rate 18, weight 273 lb (123.832 kg), not currently breastfeeding.  Body mass index is 49.92 kg/(m^2).  Past Medical History   Diagnosis Date   ??? Cancer (HCC)      kidney    ??? Chronic kidney disease (HCC)    ??? Psychiatric problem    ??? Seizures (HCC)      Pt voiced she has seizures.   ??? Lupus (HCC)    ??? Depression    ??? Anxiety    ??? Fibromyalgia    ??? Post traumatic stress disorder (PTSD)    ??? HTN (hypertension)    ??? Diabetes mellitus (HCC)      type 2        History     Social History   ??? Marital Status: Married     Spouse Name: N/A     Number of Children: N/A   ??? Years of Education: N/A     Occupational History   ??? Not on file.     Social History Main Topics   ??? Smoking status: Former Smoker -- 0.25 packs/day for 6 years     Types: Cigarettes     Quit date: 02/12/2013   ??? Smokeless tobacco: Never Used   ??? Alcohol Use: No   ??? Drug Use: No   ??? Sexually Active: Not on file     Other Topics Concern   ???  Not on file     Social History Narrative   ??? No narrative on file       Past Surgical History   Procedure Laterality Date   ??? Cholecystectomy  2000     [place]    ??? Nose surgery  2007     polyp removed-[facility]    ??? Hysterectomy  1998     [facility]    ??? Colonoscopy  2004     Dr. Pat Patrick    ??? Upper gastrointestinal endoscopy  2004     Dr. Pat Patrick    ??? Abdominal hernia repair  02-07-13     [facility]- Repair Trocar Site Hernia  2. Repair Umbilical Hernia  3. Repair  [facility]       Allergies   Allergen Reactions   ??? Imitrex (Sumatriptan) Anaphylaxis   ??? Iv Dye (Iodides) Anaphylaxis   ??? Amoxicillin-Pot  Clavulanate Hives and Itching   ??? Azithromycin Hives and Itching   ??? Bactrim Hives and Itching   ??? Cefuroxime Axetil Hives and Itching   ??? Ciprofloxacin Hives and Itching   ??? Erythromycin Hives and Itching   ??? Flagyl (Metronidazole) Hives and Itching   ??? Ketorolac Tromethamine Hives and Itching   ??? Klonopin (Clonazepam) Hives and Itching   ??? Levofloxacin Hives and Itching   ??? Nubain (Nalbuphine Hcl)      blister   ??? Other      Stadol-blisters    ??? Pcn (Penicillins) Hives and Itching   ??? Reglan (Metoclopramide)      Blistering    ??? Sulfa Antibiotics Hives and Itching   ??? Tramadol Hives and Itching       Current outpatient prescriptions:HYDROcodone-acetaminophen (NORCO) 10-325 MG per tablet, Take 1 tablet by mouth nightly for 50 doses., Disp: 50 tablet, Rfl: 0;  clindamycin (CLEOCIN) 150 MG capsule, Take 2 capsules by mouth 3 times daily for 42 doses., Disp: 42 capsule, Rfl: 0;  clindamycin (CLEOCIN) 300 MG capsule, Take 1 capsule by mouth 3 times daily for 10 days., Disp: 30 capsule, Rfl: 0  ondansetron (ZOFRAN) 4 MG tablet, Take 1 tablet by mouth every 6 hours as needed for Nausea or Vomiting for 30 doses., Disp: 30 tablet, Rfl: 0;  omeprazole (PRILOSEC) 20 MG capsule, Take 40 mg by mouth 2 times daily (before meals)., Disp: , Rfl: ;  lisinopril (PRINIVIL;ZESTRIL) 20 MG tablet, Take 20 mg by mouth daily., Disp: , Rfl: ;  metFORMIN (GLUCOPHAGE) 500 MG tablet, Take 500 mg by mouth 2 times daily (with meals)., Disp: , Rfl:   ARIPiprazole (ABILIFY) 10 MG tablet, Take 10 mg by mouth daily., Disp: , Rfl: ;  metoprolol (LOPRESSOR) 25 MG tablet, Take 25 mg by mouth 2 times daily., Disp: , Rfl: ;  LevETIRAcetam (KEPPRA PO), Take 500 mg by mouth 2 times daily., Disp: , Rfl: ;  ondansetron (ZOFRAN ODT) 4 MG disintegrating tablet, Take 1 tablet by mouth every 8 hours as needed for Nausea for 15 doses., Disp: 15 tablet, Rfl: 0  diazepam (VALIUM) 5 MG tablet, Take 5 mg by mouth every 6 hours as needed.  , Disp: , Rfl: ;   HYDROcodone-acetaminophen (NORCO) 10-325 MG per tablet, Take 1 tablet by mouth every 6 hours as needed for Pain for 50 doses., Disp: 50 tablet, Rfl: 0;  polyethylene glycol (GLYCOLAX) packet, Take 17 g by mouth daily as needed (Take to prevent constipation) for 30 doses., Disp: 527 g, Rfl: 2  zolpidem (AMBIEN) 10 MG tablet, Take 10 mg  by mouth nightly as needed., Disp: , Rfl: ;  venlafaxine (EFFEXOR-XR) 150 MG XR capsule, Take 150 mg by mouth daily., Disp: , Rfl: ;  phenytoin (DILANTIN) 100 MG ER capsule, Take 100 mg by mouth 3 times daily. May increase up to 5 100 mg tablets if needed (depending on dilantin level), Disp: , Rfl:     Family History   Problem Relation Age of Onset   ??? Adopted: Yes   ??? Cancer Father      pancreatic    ??? Cancer Mother      uterine and ovarian    ??? Osteoporosis Mother          Objective:   Physical ExamThe wound actually looked good no cellulitis difficult to determine which wound had MRSA    Assessment:      Postoperative MRSA infection patient is fairly obese and likely had a role in the development of the infection       Plan:      Continue antibiotics and return to office in one week

## 2013-03-04 NOTE — Progress Notes (Signed)
Subjective:      Patient ID: Theresa Hobbs is a 47 y.o. female.    HPI    Review of Systems    Objective:   Physical Exam    Assessment:      No show       Plan:

## 2013-03-04 NOTE — Progress Notes (Deleted)
Subjective:      Patient ID: Theresa Hobbs is a 46 y.o. female.    HPI    Review of Systems    Objective:   Physical Exam    Assessment:              Plan:

## 2013-03-04 NOTE — Telephone Encounter (Signed)
Called patient again due to her not showing for appointment this am. Cannot leave a message, voicemail is full.

## 2013-03-04 NOTE — Telephone Encounter (Signed)
Ok

## 2013-03-06 NOTE — Telephone Encounter (Addendum)
Pt called in stating that she is taking vancomyocin for MRSA. She states that she called Dr. Sharla Kidney yesterday with complaints of vomiting and diarrhea. Pt states that Dr. Sharla Kidney states that it could be because of the antibiotic. Pt wanted to know what can be done for these symptoms.     Called [facility] and he states to find out who put her on vancomyocin because he did not.     Called pt back and she states that is what he prescribed he the other day in the office. Looking back through her medications he prescribed her clindamyocin. Pt states that is what she is taking now.      Pt is wondering what else can be done for the diarrhea and vomitting. Pt states that she is taking the antibiotic on a full stomach.     Per Dr. Pat Patrick try stopping the antibiotic to see if the symptoms dont get better.

## 2013-03-06 NOTE — Telephone Encounter (Addendum)
Attempted to call pt to schedule pt an appointment to be seen with Dr. Pat Patrick. Pt has no showed her last two appointments with him. And to advise her if the vomitting or diarrhea doesn't get any better to go to the ER. Per Dr. Pat Patrick stop taking the antibiotic to see if she dont get any relief. Unable to leave message

## 2013-03-11 MED ORDER — PROMETHAZINE HCL 25 MG PO TABS
25 MG | ORAL_TABLET | Freq: Four times a day (QID) | ORAL | Status: DC | PRN
Start: 2013-03-11 — End: 2013-03-25

## 2013-03-11 MED ORDER — ACETAMINOPHEN-CODEINE 300-30 MG PO TABS
300-30 MG | ORAL_TABLET | ORAL | Status: DC | PRN
Start: 2013-03-11 — End: 2013-03-25

## 2013-03-11 NOTE — Progress Notes (Signed)
Subjective:      Patient ID: Theresa Hobbs is a 47 y.o. female.  Chief Complaint   Patient presents with   ??? Post-Op Check     S/p repair of abdominal wall, umbilical hernia, and right inguinal hernias 02-07-13.     ??? Nausea & Vomiting     Ususally vomits about twice daily    ??? Diarrhea     4-5 loose stools daily    ??? Medication Refill     Would like a refill of tylenol #3 and  zofran        HPI As above patient had hernia repair x3 right upper quadrant and umbilical hernia wounds are good pains in the right groin she did develop a MRSA infection but there's no drainage at this time Patient has a lot of complaints nausea vomiting diarrhea but also has an anxiety disorder the patient had a lot of her complaints    Review of Systems   Constitutional: Positive for fever, activity change, appetite change and fatigue. Negative for chills and unexpected weight change.   HENT: Negative for hearing loss, nosebleeds, trouble swallowing, neck pain, neck stiffness and tinnitus.    Eyes: Negative for photophobia and visual disturbance.   Respiratory: Negative for apnea, cough, chest tightness, shortness of breath and wheezing.    Cardiovascular: Positive for leg swelling. Negative for chest pain and palpitations.        Swelling in bilateral hands    Gastrointestinal: Positive for nausea, vomiting and diarrhea. Negative for abdominal pain, constipation, blood in stool and abdominal distention.   Endocrine: Negative for cold intolerance and heat intolerance.   Genitourinary: Negative for urgency, frequency, hematuria, vaginal bleeding, vaginal discharge, difficulty urinating and menstrual problem.        Right groin discomfort    Musculoskeletal: Positive for back pain. Negative for arthralgias.   Skin: Positive for wound. Negative for rash.        Mild separation of right end of right groin incision.     Allergic/Immunologic: Negative for environmental allergies and immunocompromised state.   Neurological: Negative for dizziness,  tremors, seizures, weakness, light-headedness, numbness and headaches.   Hematological: Negative for adenopathy. Does not bruise/bleed easily.   Psychiatric/Behavioral: Positive for sleep disturbance. Negative for behavioral problems. The patient is nervous/anxious.      BP 126/86   Pulse 80   Temp(Src) 98.3 ??F (36.8 ??C) (Oral)   Resp 18   Ht 5\' 2"  (1.575 m)   Wt 277 lb 9.6 oz (125.919 kg)   BMI 50.76 kg/m2   Breastfeeding? No  Past Medical History   Diagnosis Date   ??? Cancer (HCC)      kidney    ??? Chronic kidney disease (HCC)    ??? Psychiatric problem    ??? Seizures (HCC)      Pt voiced she has seizures.   ??? Lupus (HCC)    ??? Depression    ??? Anxiety    ??? Fibromyalgia    ??? Post traumatic stress disorder (PTSD)    ??? HTN (hypertension)    ??? Diabetes mellitus (HCC)      type 2    ??? MRSA (methicillin resistant Staphylococcus aureus)        History     Social History   ??? Marital Status: Married     Spouse Name: N/A     Number of Children: N/A   ??? Years of Education: N/A     Occupational History   ???  Not on file.     Social History Main Topics   ??? Smoking status: Former Smoker -- 0.25 packs/day for 6 years     Types: Cigarettes     Quit date: 02/12/2013   ??? Smokeless tobacco: Never Used   ??? Alcohol Use: No   ??? Drug Use: No   ??? Sexually Active: Not on file     Other Topics Concern   ??? Not on file     Social History Narrative   ??? No narrative on file       Past Surgical History   Procedure Laterality Date   ??? Cholecystectomy  2000     [place]    ??? Nose surgery  2007     polyp removed-[facility]    ??? Hysterectomy  1998     [facility]    ??? Colonoscopy  2004     Dr. Pat Patrick    ??? Upper gastrointestinal endoscopy  2004     Dr. Pat Patrick    ??? Abdominal hernia repair  02-07-13     [facility]- Repair Trocar Site Hernia  2. Repair Umbilical Hernia  3. Repair  [facility]       Allergies   Allergen Reactions   ??? Imitrex (Sumatriptan) Anaphylaxis   ??? Iv Dye (Iodides) Anaphylaxis   ??? Amoxicillin-Pot Clavulanate Hives and Itching   ??? Azithromycin Hives  and Itching   ??? Bactrim Hives and Itching   ??? Cefuroxime Axetil Hives and Itching   ??? Ciprofloxacin Hives and Itching   ??? Erythromycin Hives and Itching   ??? Flagyl (Metronidazole) Hives and Itching   ??? Ketorolac Tromethamine Hives and Itching   ??? Klonopin (Clonazepam) Hives and Itching   ??? Levofloxacin Hives and Itching   ??? Nubain (Nalbuphine Hcl)      blister   ??? Other      Stadol-blisters    ??? Pcn (Penicillins) Hives and Itching   ??? Reglan (Metoclopramide)      Blistering    ??? Sulfa Antibiotics Hives and Itching   ??? Tramadol Hives and Itching       Current outpatient prescriptions:ondansetron (ZOFRAN) 4 MG tablet, Take 1 tablet by mouth every 6 hours as needed for Nausea or Vomiting for 30 doses., Disp: 30 tablet, Rfl: 0;  omeprazole (PRILOSEC) 20 MG capsule, Take 40 mg by mouth 2 times daily (before meals)., Disp: , Rfl: ;  lisinopril (PRINIVIL;ZESTRIL) 20 MG tablet, Take 20 mg by mouth daily., Disp: , Rfl:   metFORMIN (GLUCOPHAGE) 500 MG tablet, Take 500 mg by mouth 2 times daily (with meals)., Disp: , Rfl: ;  ARIPiprazole (ABILIFY) 10 MG tablet, Take 10 mg by mouth daily., Disp: , Rfl: ;  metoprolol (LOPRESSOR) 25 MG tablet, Take 25 mg by mouth 2 times daily., Disp: , Rfl: ;  LevETIRAcetam (KEPPRA PO), Take 500 mg by mouth 2 times daily., Disp: , Rfl: ;  zolpidem (AMBIEN) 10 MG tablet, Take 10 mg by mouth nightly as needed., Disp: , Rfl:   venlafaxine (EFFEXOR-XR) 150 MG XR capsule, Take 150 mg by mouth daily., Disp: , Rfl: ;  diazepam (VALIUM) 5 MG tablet, Take 5 mg by mouth every 6 hours as needed.  , Disp: , Rfl: ;  phenytoin (DILANTIN) 100 MG ER capsule, Take 100 mg by mouth 3 times daily. May increase up to 5 100 mg tablets if needed (depending on dilantin level), Disp: , Rfl:   polyethylene glycol (GLYCOLAX) packet, Take 17 g by mouth daily as needed (  Take to prevent constipation) for 30 doses., Disp: 527 g, Rfl: 2    Family History   Problem Relation Age of Onset   ??? Adopted: Yes   ??? Cancer Father       pancreatic    ??? Cancer Mother      uterine and ovarian    ??? Osteoporosis Mother          Objective:   Physical Exam All wounds are good no signs of infection    Assessment:      Status post hernia repair x3 right groin his only source of pain. I think part of the problem is her weight with a BMI of 50       Plan:      Continue pain meds and return to office 2 weeks

## 2013-03-25 MED ORDER — ACETAMINOPHEN-CODEINE 300-30 MG PO TABS
300-30 MG | ORAL_TABLET | ORAL | Status: DC | PRN
Start: 2013-03-25 — End: 2013-05-29

## 2013-03-25 NOTE — Progress Notes (Signed)
Subjective:      Patient ID: Theresa Hobbs is a 47 y.o. female.  Chief Complaint   Patient presents with   ??? Post-Op Check     s/p trocar site hernia repair, umbilical hernia repair,[facility] repair 02/07/13-last seen in the office 03/11/13       HPI As above patient is feeling better still has some mild pain at each site    Review of Systems   Constitutional: Negative for fever, chills, fatigue and unexpected weight change.   HENT: Positive for neck pain and neck stiffness. Negative for hearing loss, nosebleeds, trouble swallowing and tinnitus.    Eyes: Negative for photophobia and visual disturbance.   Respiratory: Negative for apnea, cough, chest tightness, shortness of breath and wheezing.    Cardiovascular: Positive for leg swelling. Negative for chest pain and palpitations.   Gastrointestinal: Positive for nausea, vomiting, abdominal pain and diarrhea. Negative for constipation, blood in stool and abdominal distention.   Endocrine: Negative for cold intolerance and heat intolerance.   Genitourinary: Negative for urgency, frequency, hematuria, vaginal bleeding, vaginal discharge, difficulty urinating and menstrual problem.        Right groin pain   Musculoskeletal: Positive for back pain. Negative for arthralgias.   Skin: Negative for rash and wound.   Allergic/Immunologic: Positive for environmental allergies. Negative for immunocompromised state.   Neurological: Negative for dizziness, tremors, seizures, weakness, light-headedness, numbness and headaches.   Hematological: Negative for adenopathy. Does not bruise/bleed easily.   Psychiatric/Behavioral: Negative for behavioral problems. The patient is nervous/anxious.      BP 132/88   Pulse 96   Temp(Src) 98 ??F (36.7 ??C) (Oral)   Resp 20   Ht 5\' 2"  (1.575 m)   Wt 274 lb 1.6 oz (124.331 kg)   BMI 50.12 kg/m2   Breastfeeding? No  Past Medical History   Diagnosis Date   ??? Cancer (HCC)      kidney    ??? Chronic kidney disease (HCC)    ??? Psychiatric problem    ??? Seizures (HCC)       Pt voiced she has seizures.   ??? Lupus (HCC)    ??? Depression    ??? Anxiety    ??? Fibromyalgia    ??? Post traumatic stress disorder (PTSD)    ??? HTN (hypertension)    ??? Diabetes mellitus (HCC)      type 2    ??? MRSA (methicillin resistant Staphylococcus aureus)        History     Social History   ??? Marital Status: Married     Spouse Name: N/A     Number of Children: N/A   ??? Years of Education: N/A     Occupational History   ??? unemployed      Social History Main Topics   ??? Smoking status: Current Every Day Smoker -- 0.10 packs/day for 6 years     Types: Cigarettes     Last Attempt to Quit: 02/12/2013   ??? Smokeless tobacco: Never Used   ??? Alcohol Use: No   ??? Drug Use: No   ??? Sexual Activity: Not on file     Other Topics Concern   ??? Not on file     Social History Narrative       Past Surgical History   Procedure Laterality Date   ??? Cholecystectomy  2000     [place]    ??? Nose surgery  2007     polyp removed-[facility]    ???  Hysterectomy  1998     [facility]    ??? Colonoscopy  2004     Dr. Pat Patrick    ??? Upper gastrointestinal endoscopy  2004     Dr. Pat Patrick    ??? Abdominal hernia repair  02-07-13     [facility]- Repair Trocar Site Hernia  2. Repair Umbilical Hernia  3. Repair  [facility]       Allergies   Allergen Reactions   ??? Imitrex [Sumatriptan] Anaphylaxis   ??? Iv Dye [Iodides] Anaphylaxis   ??? Amoxicillin-Pot Clavulanate Hives and Itching   ??? Azithromycin Hives and Itching   ??? Bactrim Hives and Itching   ??? Cefuroxime Axetil Hives and Itching   ??? Ciprofloxacin Hives and Itching   ??? Erythromycin Hives and Itching   ??? Flagyl [Metronidazole] Hives and Itching   ??? Ketorolac Tromethamine Hives and Itching   ??? Klonopin [Clonazepam] Hives and Itching   ??? Levofloxacin Hives and Itching   ??? Nubain [Nalbuphine Hcl]      blister   ??? Other      Stadol-blisters    ??? Pcn [Penicillins] Hives and Itching   ??? Reglan [Metoclopramide]      Blistering    ??? Sulfa Antibiotics Hives and Itching   ??? Tramadol Hives and Itching       Current outpatient  prescriptions:polyethylene glycol (GLYCOLAX) packet, Take 17 g by mouth daily as needed (Take to prevent constipation) for 30 doses., Disp: 527 g, Rfl: 2, ;  metFORMIN (GLUCOPHAGE) 500 MG tablet, Take 500 mg by mouth 2 times daily (with meals)., Disp: , Rfl: , ;  ARIPiprazole (ABILIFY) 10 MG tablet, Take 10 mg by mouth daily., Disp: , Rfl: , ;  LevETIRAcetam (KEPPRA PO), Take 500 mg by mouth 2 times daily., Disp: , Rfl: ,   zolpidem (AMBIEN) 10 MG tablet, Take 10 mg by mouth nightly as needed., Disp: , Rfl: , ;  venlafaxine (EFFEXOR-XR) 150 MG XR capsule, Take 150 mg by mouth daily., Disp: , Rfl: , ;  diazepam (VALIUM) 5 MG tablet, Take 5 mg by mouth every 6 hours as needed.  , Disp: , Rfl: , ;  phenytoin (DILANTIN) 100 MG ER capsule, Take 100 mg by mouth 3 times daily. May increase up to 5 100 mg tablets if needed (depending on dilantin level), Disp: , Rfl: ,   lisinopril (PRINIVIL;ZESTRIL) 20 MG tablet, Take 20 mg by mouth daily., Disp: , Rfl: , ;  metoprolol (LOPRESSOR) 25 MG tablet, Take 25 mg by mouth 2 times daily., Disp: , Rfl: ,     Family History   Problem Relation Age of Onset   ??? Adopted: Yes   ??? Cancer Father      pancreatic    ??? Cancer Mother      uterine and ovarian    ??? Osteoporosis Mother          Objective:   Physical ExamIncisions good no signs of infection    Assessment:      Doing well       Plan:      Return to office as needed

## 2013-05-20 NOTE — Telephone Encounter (Signed)
Called patient due to her not showing for appointment today. Patient states her brother in in ICU, rescheduled to next Thursday.

## 2013-05-20 NOTE — Progress Notes (Signed)
No show

## 2013-05-29 MED ORDER — HYDROCODONE-ACETAMINOPHEN 5-325 MG PO TABS
5-325 MG | ORAL_TABLET | Freq: Four times a day (QID) | ORAL | Status: DC | PRN
Start: 2013-05-29 — End: 2015-11-23

## 2013-05-29 MED ORDER — PROMETHAZINE HCL 25 MG PO TABS
25 MG | ORAL_TABLET | Freq: Three times a day (TID) | ORAL | Status: DC | PRN
Start: 2013-05-29 — End: 2015-11-23

## 2013-05-29 NOTE — Telephone Encounter (Signed)
Scheduled patient for a colonoscopy & egd on Wed 10/1 at 8am. She will arrive at 6:45am & the prep instructions were given to patient.

## 2013-05-29 NOTE — Progress Notes (Signed)
Subjective:      Patient ID: Theresa Hobbs is a 47 y.o. female.  Chief Complaint   Patient presents with   ??? Post-Op Check     s/p repair trocar site hernia, repair umbilical hernia, repair RIH 02/07/13-last seen in the office 05/20/13   ??? Nausea     requesting egd and colonoscopy-   ??? Diarrhea       HPIAs above patient's pain from the above hernia repairs have the most part resolved. However she is having a lot of diarrhea which is nonbloody . She is abdominal cramping and having a lot of nausea . She denies any hematemesis. She has a questionable history of ulcers in the past she denies any family history of colon cancer. She's had no weight lossShe does have a history of depression    Review of Systems   Constitutional: Positive for activity change and appetite change. Negative for fever, chills, diaphoresis, fatigue and unexpected weight change.   HENT: Negative for hearing loss, ear pain, nosebleeds, congestion, sore throat, facial swelling, rhinorrhea, sneezing, drooling, mouth sores, trouble swallowing, neck pain, neck stiffness, dental problem, voice change, postnasal drip, sinus pressure, tinnitus and ear discharge.    Eyes: Negative for photophobia, pain, discharge, redness, itching and visual disturbance.   Respiratory: Negative for apnea, cough, choking, chest tightness, shortness of breath, wheezing and stridor.    Cardiovascular: Negative for chest pain, palpitations and leg swelling.   Gastrointestinal: Positive for nausea, vomiting, abdominal pain and diarrhea. Negative for constipation, blood in stool, abdominal distention, anal bleeding and rectal pain.   Genitourinary: Negative for dysuria, urgency, frequency, hematuria, flank pain, decreased urine volume, vaginal bleeding, vaginal discharge, enuresis, difficulty urinating, genital sores, vaginal pain, menstrual problem, pelvic pain and dyspareunia.   Musculoskeletal: Negative for myalgias, back pain, joint swelling, arthralgias and gait problem.    Skin: Negative for color change, pallor, rash and wound.   Neurological: Negative for dizziness, tremors, seizures, syncope, facial asymmetry, speech difficulty, weakness, light-headedness, numbness and headaches.   Hematological: Negative for adenopathy. Does not bruise/bleed easily.   Psychiatric/Behavioral: Negative for suicidal ideas, hallucinations, behavioral problems, confusion, sleep disturbance, self-injury, dysphoric mood, decreased concentration and agitation. The patient is not nervous/anxious and is not hyperactive.      Blood pressure 110/70, pulse 80, temperature 97.1 ??F (36.2 ??C), temperature source Tympanic, resp. rate 16, height 5\' 2"  (1.575 m), weight 274 lb (124.286 kg), not currently breastfeeding.  Body mass index is 50.1 kg/(m^2).  Past Medical History   Diagnosis Date   ??? Cancer (HCC)      kidney    ??? Chronic kidney disease (HCC)    ??? Psychiatric problem    ??? Seizures (HCC)      Pt voiced she has seizures.   ??? Lupus (HCC)    ??? Depression    ??? Anxiety    ??? Fibromyalgia    ??? Post traumatic stress disorder (PTSD)    ??? HTN (hypertension)    ??? Diabetes mellitus (HCC)      type 2    ??? MRSA (methicillin resistant Staphylococcus aureus)        History     Social History   ??? Marital Status: Married     Spouse Name: N/A     Number of Children: N/A   ??? Years of Education: N/A     Occupational History   ??? unemployed      Social History Main Topics   ??? Smoking status: Current  Every Day Smoker -- 0.10 packs/day for 6 years     Types: Cigarettes     Last Attempt to Quit: 02/12/2013   ??? Smokeless tobacco: Never Used   ??? Alcohol Use: No   ??? Drug Use: No   ??? Sexual Activity: Not on file     Other Topics Concern   ??? Not on file     Social History Narrative       Past Surgical History   Procedure Laterality Date   ??? Cholecystectomy  2000     [place]    ??? Nose surgery  2007     polyp removed-[facility]    ??? Hysterectomy  1998     [facility]    ??? Colonoscopy  2004     Dr. Pat Patrick    ??? Upper gastrointestinal  endoscopy  2004     Dr. Pat Patrick    ??? Abdominal hernia repair  02-07-13     Bill Yohn- Repair Trocar Site Hernia  2. Repair Umbilical Hernia  3. Repair  RIH       Allergies   Allergen Reactions   ??? Imitrex [Sumatriptan] Anaphylaxis   ??? Iv Dye [Iodides] Anaphylaxis   ??? Amoxicillin-Pot Clavulanate Hives and Itching   ??? Azithromycin Hives and Itching   ??? Bactrim Hives and Itching   ??? Cefuroxime Axetil Hives and Itching   ??? Ciprofloxacin Hives and Itching   ??? Erythromycin Hives and Itching   ??? Flagyl [Metronidazole] Hives and Itching   ??? Ketorolac Tromethamine Hives and Itching   ??? Klonopin [Clonazepam] Hives and Itching   ??? Levofloxacin Hives and Itching   ??? Nubain [Nalbuphine Hcl]      blister   ??? Other      Stadol-blisters    ??? Pcn [Penicillins] Hives and Itching   ??? Reglan [Metoclopramide]      Blistering    ??? Sulfa Antibiotics Hives and Itching   ??? Tramadol Hives and Itching       Current outpatient prescriptions:lisinopril (PRINIVIL;ZESTRIL) 20 MG tablet, Take 20 mg by mouth daily., Disp: , Rfl: , ;  metFORMIN (GLUCOPHAGE) 500 MG tablet, Take 500 mg by mouth 2 times daily (with meals)., Disp: , Rfl: , ;  ARIPiprazole (ABILIFY) 10 MG tablet, Take 10 mg by mouth daily., Disp: , Rfl: , ;  metoprolol (LOPRESSOR) 25 MG tablet, Take 25 mg by mouth 2 times daily., Disp: , Rfl: ,   LevETIRAcetam (KEPPRA PO), Take 500 mg by mouth 2 times daily., Disp: , Rfl: , ;  zolpidem (AMBIEN) 10 MG tablet, Take 10 mg by mouth nightly as needed., Disp: , Rfl: , ;  venlafaxine (EFFEXOR-XR) 150 MG XR capsule, Take 150 mg by mouth daily., Disp: , Rfl: , ;  diazepam (VALIUM) 5 MG tablet, Take 5 mg by mouth every 6 hours as needed.  , Disp: , Rfl: ,   phenytoin (DILANTIN) 100 MG ER capsule, Take 100 mg by mouth 3 times daily. May increase up to 5 100 mg tablets if needed (depending on dilantin level), Disp: , Rfl: ,     Family History   Problem Relation Age of Onset   ??? Adopted: Yes   ??? Cancer Father      pancreatic    ??? Cancer Mother       uterine and ovarian    ??? Osteoporosis Mother          Objective:   Physical ExamWounds are healing well abdomen is soft  Assessment:      1.Persistent diarrhea 2. Persistent nausea patient's last colonoscopy and EGD was in 2004 we discussed repeating these procedures given the symptoms. We discussed the prep, the risk of perforation and she would like to proceed       Plan:      1. Schedule Joi for Colonoscopy/EGD in  2. The risks, benefits and alternatives were discussed with Joi. She understands and wishes to proceed with surgical intervention.  3. Restrictions discussed with Joi and she expresses understanding.  4. She is advised to call back directly if there are further questions/concerns, or if her symptoms worsen prior to surgery.

## 2013-06-02 NOTE — Telephone Encounter (Signed)
Theresa Hobbs called stating she now has the flu so she needs to reschedule her colonoscopy & egd which is scheduled for Wed. 10/1. I told her I will get back with her tomorrow to give her a new date.

## 2013-06-02 NOTE — Telephone Encounter (Signed)
Theresa Hobbs called me stating that [facility] & she mentioned to them that she is having a colonoscopy & egd Wed & they told her she can't have it and she needs to wait a couple months. [facility] & [name] & she never heard of such a thing. Theresa Hobbs is active & the CPT codes 45378-colonoscopy & 43235-egd are covered & need no pre-cert since everyone is in network. I called Theresa Hobbs back & gave her this info so she will have the procedures Wed 10/1.

## 2013-06-03 NOTE — Patient Instructions (Signed)
Furniture conservator/restorer and Company secretary  Do not bring jewelry or valuables  Shower night before and morning of surgery with a liquid antibacterial soap  Bring list of medications with dosage and how often taken    PHONE MSG LEFT WITH ABOVE INFO AS WELL AS:  ARRIVAL TIME AND PLACE, BRING A DRIVER WITH YOU, ADVISED TO REVIEW INSTRUCTION SHEET GIVEN BY DR HIXENBAUGH FOR MEDS TO TAKE AND TIMING OF PREP, NPO STATUS, CALLBACK NUMBER PROVIDED FOR QUESTIONS.

## 2013-06-04 NOTE — Telephone Encounter (Signed)
Rescheduled Joi for her colonoscopy & egd on Thurs 10/9 at 8am & she will arrive at 6:45am. She was given this info over the phone & she voiced understanding.

## 2013-06-12 NOTE — Telephone Encounter (Signed)
Joi had left a message for me yesterday (10/8) that she is cancelling her scopes that were scheduled for this morning due to she is not feeling well. She will call back to reschedule. I did let central scheduling Angelique Blonder(Denise) know yesterday to cancel.

## 2014-04-14 ENCOUNTER — Inpatient Hospital Stay
Admit: 2014-04-14 | Discharge: 2014-04-14 | Disposition: A | Discharge status: Home / Self Care | Attending: Family Medicine

## 2014-04-14 LAB — CBC WITH AUTO DIFFERENTIAL
Basophils Absolute: 0 10*3/uL (ref 0.0–0.1)
Basophils: 0.4 %
Eosinophils Absolute: 0.2 10*3/uL (ref 0.0–0.4)
Eosinophils: 2.4 %
Hematocrit: 48.5 % — ABNORMAL HIGH (ref 37.0–47.0)
Hemoglobin: 15.8 gm/dl (ref 12.0–16.0)
Lymphocytes Absolute: 3.4 10*3/uL (ref 1.0–4.8)
Lymphocytes: 37.6 %
MCH: 29.1 pg (ref 27.0–31.0)
MCHC: 32.5 gm/dl — ABNORMAL LOW (ref 33.0–37.0)
MCV: 89.5 fL (ref 81.0–99.0)
MPV: 10.6 mcm — ABNORMAL HIGH (ref 7.4–10.4)
Monocytes Absolute: 0.6 10*3/uL (ref 0.4–1.3)
Monocytes: 6.9 %
Platelets: 172 10*3/uL (ref 130–400)
RBC Morphology: NORMAL
RBC: 5.42 10*6/uL — ABNORMAL HIGH (ref 4.20–5.40)
RDW: 14.1 % (ref 11.5–14.5)
Seg Neutrophils: 52.7 %
Segs Absolute: 4.7 10*3/uL (ref 1.8–7.7)
WBC: 9 10*3/uL (ref 4.8–10.8)
nRBC: 0 /100 wbc

## 2014-04-14 LAB — URINALYSIS WITH MICROSCOPIC
Bilirubin Urine: NEGATIVE
Casts: NONE SEEN /lpf
Casts: NONE SEEN /lpf
Crystals: NONE SEEN
Glucose, Urine: NEGATIVE mg/dl
Ketones, Urine: NEGATIVE
Leukocyte Esterase, Urine: NEGATIVE
Miscellaneous Lab Test Result: NONE SEEN
Nitrite, Urine: NEGATIVE
Protein, UA: NEGATIVE mg/dl
Renal Epithelial, UA: NONE SEEN
Specific Gravity, UA: 1.021 (ref 1.002–1.03)
Urobilinogen, Urine: 0.2 eu/dl (ref 0.0–1.0)
pH, UA: 7.5 (ref 5.0–9.0)

## 2014-04-14 LAB — ANION GAP: Anion Gap: 17.2 (ref 10.0–20.0)

## 2014-04-14 LAB — ETHANOL: ETHYL ALCOHOL, SERUM: <0.01 % (gm/dl)

## 2014-04-14 LAB — POCT GLUCOSE: POC Glucose: 95 mg/dl (ref 70–108)

## 2014-04-14 LAB — BASIC METABOLIC PANEL
BUN: 16 mg/dl (ref 7–22)
CO2: 27 meq/l (ref 23–33)
Calcium: 9.1 mg/dl (ref 8.5–10.5)
Chloride: 99 meq/l (ref 98–111)
Creatinine: 0.9 mg/dl (ref 0.4–1.2)
Glucose: 104 mg/dl (ref 70–108)
Potassium: 4.2 meq/l (ref 3.5–5.2)
Sodium: 139 meq/l (ref 135–145)

## 2014-04-14 LAB — OSMOLALITY: Osmolality Calc: 279 mOsmol/kg (ref 275.0–300)

## 2014-04-14 LAB — URINE DRUG SCREEN
Amphetamine+Methamphetamine Urine Screen: NEGATIVE
Barbiturate Quant, Ur: POSITIVE
Benzodiazepine Quant, Ur: POSITIVE
Cannabinoid Quant, Ur: NEGATIVE
Cocaine Metab Quant, Ur: NEGATIVE
Opiates, Urine: NEGATIVE
PCP Quant, Ur: NEGATIVE

## 2014-04-14 LAB — GLOMERULAR FILTRATION RATE, ESTIMATED: Est, Glom Filt Rate: 67 mL/min/{1.73_m2} — AB

## 2014-04-14 LAB — PHENYTOIN LEVEL, TOTAL: Phenytoin Lvl: 5.9 ug/mL — ABNORMAL LOW (ref 6.0–18.0)

## 2014-04-14 MED ORDER — PROMETHAZINE HCL 25 MG PO TABS
25 MG | ORAL_TABLET | Freq: Four times a day (QID) | ORAL | Status: AC | PRN
Start: 2014-04-14 — End: 2014-04-21

## 2014-04-14 MED ADMIN — promethazine (PHENERGAN) injection 25 mg: 25 mg | INTRAMUSCULAR | @ 21:00:00 | NDC 00641092821

## 2014-04-14 MED ADMIN — HYDROcodone-acetaminophen (NORCO) 5-325 MG per tablet 1 tablet: 1 | ORAL | @ 19:00:00 | NDC 00406012323

## 2014-04-14 MED ADMIN — ondansetron (ZOFRAN-ODT) disintegrating tablet 4 mg: 4 mg | ORAL | @ 19:00:00 | NDC 68001024616

## 2014-04-14 MED FILL — PROMETHAZINE HCL 25 MG/ML IJ SOLN: 25 MG/ML | INTRAMUSCULAR | Qty: 1

## 2014-04-14 MED FILL — HYDROCODONE-ACETAMINOPHEN 5-325 MG PO TABS: 5-325 MG | ORAL | Qty: 1

## 2014-04-14 MED FILL — ONDANSETRON 4 MG PO TBDP: 4 MG | ORAL | Qty: 1

## 2014-04-14 NOTE — ED Notes (Signed)
BAC at bedside for patient evaluation.    Pablo Lawrence, RN  04/14/14 [phone]
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
## 2014-04-14 NOTE — ED Notes (Signed)
accucheck 95      William Hamburger, RN  04/14/14 1152

## 2014-04-14 NOTE — ED Notes (Signed)
Pt ambulated to bathroom with her mother to assist to collect sample. Pt continues to ask "Is it time to go" and continues to ask repetitive questions.    William Hamburgerave Kessler Kopinski, RN  04/14/14 [phone]

## 2014-04-14 NOTE — ED Notes (Signed)
Pt presents to the ED for c/o not able to remember. Pt states "I'm scared" and is tearful. Pt does not answer questions to name, place or date. She can confirm her name when asked to verify. She frequently asks "where am I?, can I go now?" Pt's speech is appropriate. MEWx4, resp easy. Skin P, W, and dry.    William Hamburger, RN  04/14/14 1146

## 2014-04-14 NOTE — ED Notes (Signed)
Bedside report for first encounter with patient. Pt resting comfortably on cart. Mother states "patient is having one of her seizure-like spells". States patient talks to family during incident. Pt lying on cart with eyes open. Pt clenching and relaxing jaw muscles. Pt moves head to track staff in room and responds when asked direct questions. Family at bedside - updated on plan of care, awaiting results. Verbalized understanding. Monitoring equipment applied. SR up on cart x2, call light in reach. Continue to monitor.    Harlon FlorMarilyn Gustaf Mccarter, RN  04/14/14 1316

## 2014-04-14 NOTE — ED Notes (Signed)
Dr. Hulen Skains at pt's bedside. BAC complete with their assessment. Pt still with c/o headache, will continue to monitor.     Waymond Cera, RN  04/14/14 1705

## 2014-04-14 NOTE — ED Notes (Signed)
States "we have to call my mom." Pt's mother is at her side. This was explained to her, she acknowledged her mother was by her, a few seconds later states "we got to call my mom." Pt is repetitive with her statements.    William Hamburger, RN  04/14/14 1253

## 2014-04-14 NOTE — ED Notes (Signed)
BAC in assessing patient at this time. Will reassess vital signs upon completion.     Waymond Cera, RN  04/14/14 [phone]

## 2014-04-14 NOTE — ED Provider Notes (Signed)
[facility]  eMERGENCY dEPARTMENT eNCOUnter          CHIEF COMPLAINT       Chief Complaint   Patient presents with   ??? Memory Loss     Pt is unable to answer questions. States she cannot remember   ??? Headache   ??? Mental Health Problem     Pt sent here for medical evaluation along with psychiatric evaluation       Nurses Notes reviewed and I agree except as noted in the HPI.      HISTORY OF PRESENT ILLNESS    Theresa Hobbs is a 48 y.o. female who presents for evaluation of headache and mental status changes    Onset over the last several months    Setting: History of depression, headaches, anxiety, non-epileptiform seizures  Was hospitalized at [facility] for this problem for 5 days discharged home 2 days ago  She had extensive workup including MRIs of the brain, CT scans, labs, EEGs,  Previous history of sleep apnea syndrome not currently on treatment    Associated symptoms: Headaches, at times feels confused, at times has poor memory    Severity: 5/10    Modifiers: Recent prescription for Fioricet for headache  On multiple medications for anxiety and depression  Including Valium 5 mg twice a day    Duration: Ongoing chronic problem for weeks to months    Quality: Bandlike headache around her head with sometimes pounding quality  Difficulty concentrating, poor memory            REVIEW OF SYSTEMS     Review of Systems   Constitutional: Positive for fatigue. Negative for fever, chills and diaphoresis.   HENT: Positive for congestion. Negative for trouble swallowing.    Eyes: Negative for visual disturbance.   Respiratory: Negative for cough and shortness of breath.    Cardiovascular: Negative for chest pain and palpitations.   Gastrointestinal: Negative for nausea and vomiting.   Genitourinary: Negative for difficulty urinating.   Musculoskeletal: Negative for joint swelling.   Skin: Negative for rash.   Neurological: Positive for headaches.        Intermittent staring episodes    EEGs showed no evidence of  epileptiform focus       Hematological: Negative for adenopathy.   Psychiatric/Behavioral: Positive for confusion and decreased concentration. The patient is nervous/anxious.            PAST MEDICAL HISTORY    has a past medical history of Cancer (HCC); Chronic kidney disease (HCC); Psychiatric problem; Seizures (HCC); Lupus (HCC); Depression; Anxiety; Fibromyalgia; Post traumatic stress disorder (PTSD); HTN (hypertension); Diabetes mellitus (HCC); and MRSA (methicillin resistant Staphylococcus aureus).    SURGICAL HISTORY      has past surgical history that includes Cholecystectomy (2000); Nose surgery (2007); Hysterectomy (1998); Colonoscopy (2004); Upper gastrointestinal endoscopy (2004); and Abdominal hernia repair (02-07-13).    CURRENT MEDICATIONS       Discharge Medication List as of 04/14/2014  5:52 PM      CONTINUE these medications which have NOT CHANGED    Details   HYDROcodone-acetaminophen (NORCO) 5-325 MG per tablet Take 1 tablet by mouth every 6 hours as needed for Pain for up to 30 doses., Disp-30 tablet, R-0      !! promethazine (PHENERGAN) 25 MG tablet Take 1 tablet by mouth every 8 hours as needed for Nausea for up to 30 doses., Disp-30 tablet, R-1      lisinopril (PRINIVIL;ZESTRIL) 20 MG  tablet Take 20 mg by mouth daily.      metFORMIN (GLUCOPHAGE) 500 MG tablet Take 500 mg by mouth 2 times daily (with meals).      ARIPiprazole (ABILIFY) 10 MG tablet Take 10 mg by mouth daily.      metoprolol (LOPRESSOR) 25 MG tablet Take 25 mg by mouth 2 times daily.      LevETIRAcetam (KEPPRA PO) Take 500 mg by mouth 2 times daily.      zolpidem (AMBIEN) 10 MG tablet Take 10 mg by mouth nightly as needed.      venlafaxine (EFFEXOR-XR) 150 MG XR capsule Take 150 mg by mouth daily.      diazepam (VALIUM) 5 MG tablet Take 5 mg by mouth every 6 hours as needed.        phenytoin (DILANTIN) 100 MG ER capsule Take 100 mg by mouth 3 times daily. May increase up to 5 100 mg tablets if needed (depending on dilantin level)        !! - Potential duplicate medications found. Please discuss with provider.          ALLERGIES     is allergic to imitrex; iv dye; amoxicillin-pot clavulanate; azithromycin; bactrim; cefuroxime axetil; ciprofloxacin; erythromycin; flagyl; ketorolac tromethamine; klonopin; levofloxacin; nubain; other; pcn; reglan; sulfa antibiotics; and tramadol.    FAMILY HISTORY     is adopted. family history includes Cancer in her father and mother; Osteoporosis in her mother. She was adopted.    SOCIAL HISTORY      reports that she has been smoking Cigarettes.  She has a .6 pack-year smoking history. She has never used smokeless tobacco. She reports that she does not drink alcohol or use illicit drugs.    PHYSICAL EXAM     INITIAL VITALS:  height is 5\' 2"  (1.575 m) and weight is 274 lb (124.286 kg). Her oral temperature is 98.3 ??F (36.8 ??C). Her blood pressure is 132/90 and her pulse is 82. Her respiration is 20 and oxygen saturation is 95%.    Physical Exam   Constitutional: She is oriented to person, place, and time. She appears well-developed and well-nourished.   GCS 15    At time she'll stare off during the interview with stimulus will re-attend       HENT:   Head: Normocephalic and atraumatic.   Ear canals clear, TMs normal    Nose is clear    Mouth and throat normal     Eyes: Conjunctivae and EOM are normal. Pupils are equal, round, and reactive to light.   Neck: Normal range of motion. Neck supple. No JVD present. No tracheal deviation present. No thyromegaly present.   No meningismus   Cardiovascular: Normal rate and regular rhythm.    Pulmonary/Chest: Effort normal and breath sounds normal.   Abdominal: Soft. Bowel sounds are normal. There is no tenderness. There is no rebound and no guarding.   Musculoskeletal: Normal range of motion. She exhibits no edema.   Lymphadenopathy:     She has no cervical adenopathy.   Neurological: She is alert and oriented to person, place, and time.   Muscle tone normal    She has  intermittent staring spells but on see any seizure action    Speech quality normal           Skin: Skin is warm and dry.   Psychiatric:   Intermittent anxiety     Floor conversation intact    Intermittent thought blocking    Not  hallucinating   Nursing note and vitals reviewed.        DIFFERENTIAL DIAGNOSIS:    headaches which have been extensively worked up.  She describes him as bandlike.  Sometimes pounding.  May be a mixed migraine/tension variety        Episodically staring off with episodes of poor memory, confusion    These may be combination of multiple factors including psychiatric issues, possibly medication side effects such as sedation and amnesia from sedative use,    Non-epileptiform seizures      EKG: All EKG's are interpreted by the Emergency Department Physician who either signs or Co-signs this chart in the absence of a cardiologist.  none    RADIOLOGY: non-plain film images(s) such as CT, Ultrasound and MRI are read by the radiologist.  The patient had a NA view X-ray of the NA which demonstrates NA  []  Visualized and interpreted by me   []  Radiologist's Wet Read Report Reviewed   []  Discussed with Radiologist.    LABS:   Labs Reviewed   CBC WITH AUTO DIFFERENTIAL - Abnormal; Notable for the following:     RBC 5.42 (*)     Hematocrit 48.5 (*)     MCHC 32.5 (*)     MPV 10.6 (*)     All other components within normal limits   PHENYTOIN LEVEL, TOTAL - Abnormal; Notable for the following:     Phenytoin Lvl 5.9 (*)     All other components within normal limits   GLOMERULAR FILTRATION RATE, ESTIMATED - Abnormal; Notable for the following:     Est, Glom Filt Rate 67 (*)     All other components within normal limits   URINALYSIS WITH MICROSCOPIC - Abnormal; Notable for the following:     Blood, Urine SMALL (*)     Character, Urine CLOUDY (*)     All other components within normal limits   URINE CULTURE   BASIC METABOLIC PANEL   ETHANOL   URINE DRUG SCREEN   ANION GAP   OSMOLALITY   POCT GLUCOSE        EMERGENCY DEPARTMENT COURSE:   Vitals:    Filed Vitals:    04/14/14 1314 04/14/14 1428 04/14/14 1529 04/14/14 1700   BP: 146/71 135/73 142/82 132/90   Pulse: 84 88 94 82   Temp:       TempSrc:       Resp: 18 18 21 20    Height:       Weight:       SpO2: 95% 96% 94% 95%       Nursing notes reviewed     she did bring a file from [facility] which included reports overall her CAT scans MRI scans EEGs from her last hospitalization  From last week.    I do not feel extensive workup was needed    Urine drug screen positive for barbiturates and benzodiazepines    Urinalysis negative for infection    Normal CBC    Normal renal function and electrolytes    Dilantin level 5.9, and I would not push her dose any higher she has primarily non-epileptiform seizures    She was seen by [facility]    I don't see need for medical admission    She doesn't require psychiatric admission    Given norco for headache    Given Zofran and Phenergan for nausea and that improved her headache as well    Discharge home  CRITICAL CARE:     none     CONSULTS:    [facility]    PROCEDURES:  None    FINAL IMPRESSION      1. Headache, unspecified headache type    2. Altered mental status, unspecified altered mental status type          DISPOSITION/PLAN     Discharge home     PATIENT REFERRED TO:  [facility]  [address]  [place] [address]  [phone]        counselors as recommended            DISCHARGE MEDICATIONS:  Discharge Medication List as of 04/14/2014  5:52 PM      START taking these medications    Details   !! promethazine (PHENERGAN) 25 MG tablet Take 1 tablet by mouth every 6 hours as needed for Nausea WARNING:  May cause drowsiness.  May impair ability to operate vehicles or machinery.  Do not use in combination with alcohol., Disp-12 tablet, R-0       !! - Potential duplicate medications found. Please discuss with provider.          (Please note that portions of this note were completed with a voice  recognition program.  Efforts were made to edit the dictations but occasionally words are mis-transcribed.)    Daivd Council, MD            Daivd Council, MD  04/14/14 1836

## 2014-04-14 NOTE — ED Notes (Signed)
Patient sitting up in bed, appears to be very alert and awake. Pt states her belly feels better but her head still hurts rating it a 9-10/10 and asked for this information to be relayed to the physician. Dr. Nyoka Lint informed per patient's request.     Eddie North, RN  04/14/14 [phone]

## 2014-04-14 NOTE — ED Notes (Signed)
Patient states her headache has completely subsided.    Waymond CeraStephanie M Arleen Bar, RN  04/14/14 1754

## 2014-04-14 NOTE — ED Notes (Signed)
Discharge instructions reviewed, follow up reviewed, and script given. Pt verbalized understanding. Pt taken out to ER entrance via wheelchair per this RN.     Eddie North, RN  04/14/14 1805

## 2014-04-14 NOTE — ED Notes (Signed)
Bed: 005A  Expected date:   Expected time:   Means of arrival: [facility]  Comments:

## 2014-04-14 NOTE — ED Notes (Signed)
All results received. Awaiting physician dispo.      Pablo Lawrence, RN  04/14/14 1321

## 2014-04-14 NOTE — ED Notes (Signed)
Pt resting comfortably in bed. Medication given for HA. Pt requesting to stay in ED for observation due to multiple allergies. Pt advised of BAC consult - states she requested it because she believe "this all to be in my head". SR up with call light in reach, monitor.      Harlon Flor, RN  04/14/14 [phone]

## 2014-04-16 LAB — CULTURE, URINE

## 2014-04-17 NOTE — ED Notes (Signed)
Spoke with patient. Asked to call prescription to [facility] in [facility].   Done.    Orlie Pollen, RN  04/17/14 2040

## 2014-05-02 ENCOUNTER — Inpatient Hospital Stay: Admit: 2014-05-02 | Discharge: 2014-05-02 | Disposition: A | Discharge status: Home / Self Care

## 2014-05-02 LAB — ANION GAP: Anion Gap: 23.1 — ABNORMAL HIGH (ref 10.0–20.0)

## 2014-05-02 LAB — CBC WITH AUTO DIFFERENTIAL
Basophils Absolute: 0.1 10*3/uL (ref 0.0–0.1)
Basophils: 0.6 %
Eosinophils Absolute: 0 10*3/uL (ref 0.0–0.4)
Eosinophils: 0.3 %
Hematocrit: 54 % — ABNORMAL HIGH (ref 37.0–47.0)
Hemoglobin: 18.2 gm/dl — ABNORMAL HIGH (ref 12.0–16.0)
Lymphocytes Absolute: 2.1 10*3/uL (ref 1.0–4.8)
Lymphocytes: 18.1 %
MCH: 29.1 pg (ref 27.0–31.0)
MCHC: 33.6 gm/dl (ref 33.0–37.0)
MCV: 86.5 fL (ref 81.0–99.0)
MPV: 10.2 mcm (ref 7.4–10.4)
Monocytes Absolute: 0.5 10*3/uL (ref 0.4–1.3)
Monocytes: 4.6 %
Platelets: 201 10*3/uL (ref 130–400)
RBC Morphology: NORMAL
RBC: 6.24 10*6/uL — ABNORMAL HIGH (ref 4.20–5.40)
RDW: 14.1 % (ref 11.5–14.5)
Seg Neutrophils: 76.4 %
Segs Absolute: 8.9 10*3/uL — ABNORMAL HIGH (ref 1.8–7.7)
WBC: 11.7 10*3/uL — ABNORMAL HIGH (ref 4.8–10.8)
nRBC: 0 /100 wbc

## 2014-05-02 LAB — BASIC METABOLIC PANEL
BUN: 11 mg/dl (ref 7–22)
CO2: 23 meq/l (ref 23–33)
Calcium: 9.9 mg/dl (ref 8.5–10.5)
Chloride: 96 meq/l — ABNORMAL LOW (ref 98–111)
Creatinine: 1 mg/dl (ref 0.4–1.2)
Glucose: 101 mg/dl (ref 70–108)
Potassium: 4.1 meq/l (ref 3.5–5.2)
Sodium: 138 meq/l (ref 135–145)

## 2014-05-02 LAB — MICROSCOPIC URINALYSIS
Casts: NONE SEEN /lpf
Casts: NONE SEEN /lpf
Crystals: NONE SEEN
Glucose, Urine: NEGATIVE mg/dl
Ketones, Urine: 80 — AB
Leukocytes, UA: NEGATIVE
Miscellaneous Lab Test Result: NONE SEEN
Nitrite, Urine: NEGATIVE
Protein, UA: NEGATIVE mg/dl
Renal Epithelial, UA: NONE SEEN
Specific Gravity, UA: 1.021 (ref 1.002–1.03)
Urobilinogen, Urine: 1 eu/dl (ref 0.0–1.0)
pH, UA: 5.5 (ref 5.0–9.0)

## 2014-05-02 LAB — URINE DRUG SCREEN
Amphetamine+Methamphetamine Urine Screen: NEGATIVE
Barbiturate Quant, Ur: NEGATIVE
Benzodiazepine Quant, Ur: POSITIVE
Cannabinoid Quant, Ur: POSITIVE
Cocaine Metab Quant, Ur: NEGATIVE
Opiates, Urine: NEGATIVE
PCP Quant, Ur: NEGATIVE

## 2014-05-02 LAB — ICTOTEST, URINE: Ictotest: NEGATIVE

## 2014-05-02 LAB — GLOMERULAR FILTRATION RATE, ESTIMATED: Est, Glom Filt Rate: 59 mL/min/{1.73_m2} — AB

## 2014-05-02 LAB — OSMOLALITY: Osmolality Calc: 275.2 mOsmol/kg (ref 275.0–300)

## 2014-05-02 LAB — ETHANOL: ETHYL ALCOHOL, SERUM: <0.01 % (gm/dl)

## 2014-05-02 MED ORDER — ZOLPIDEM TARTRATE 5 MG PO TABS
5 MG | ORAL_TABLET | Freq: Every evening | ORAL | Status: DC | PRN
Start: 2014-05-02 — End: 2015-11-23

## 2014-05-02 MED ORDER — DIAZEPAM 5 MG PO TABS
5 MG | ORAL_TABLET | Freq: Three times a day (TID) | ORAL | Status: AC | PRN
Start: 2014-05-02 — End: 2014-05-12

## 2014-05-02 MED ADMIN — ondansetron (ZOFRAN-ODT) 4 MG disintegrating tablet: 4 | ORAL | @ 18:00:00 | NDC 68462015713

## 2014-05-02 MED ADMIN — LORazepam (ATIVAN) 1 MG tablet: 1 | ORAL | @ 18:00:00 | NDC 00781537701

## 2014-05-02 MED ADMIN — LORazepam (ATIVAN) tablet 1 mg: 1 mg | ORAL | @ 17:00:00 | NDC 00781537701

## 2014-05-02 MED FILL — ONDANSETRON 4 MG PO TBDP: 4 MG | ORAL | Qty: 1

## 2014-05-02 MED FILL — LORAZEPAM 1 MG PO TABS: 1 MG | ORAL | Qty: 1

## 2014-05-02 NOTE — Progress Notes (Signed)
Provisional Diagnosis: Depressive Disorder NOS                                       Anxiety Disorder NOS    Psychosocial and Contextual Factors:   Change in psychotropic medication on 04/23/14    C-SSRS Summary:    Patient: X  Family: X  (Husband present)  Agency:    Present Suicidal Behavior:    Verbal: X    Attempt:  Denies, however informed ED Nurse she will stab herself or run out in front of a car    Past Suicidal Behavior:    Verbal: X    Attempt:  Denies    Self-Injurious/Self-Mutilation:  Denies    Protective Factors:  Supportive husband, mother and sister, linked to [employer]    Risk Factors:  Change in psychotropic medication    Clinical Summary:  Pt is a 48 year old female presenting to [facility] ED due to suicidal thoughts.  Pts husband is present with her.  Pt also wanted husband present during assessment.  Pt is a client of Water engineer, Dr. Lolita Lenz.  Pt reportedly had an appointment with Dr. Lolita Lenz on 04/23/14.  Pt reportedly received a call later that day informing her that Dr. Lolita Lenz discontinued her Valium and Ambien and prescribed Effexor and Abilify.  Pt state "she cut me off cold [place], I was on Valium for 7 years".  Pt reportedly has not slept or ate since 04/23/14 with a 9 pound weight lost.  Pt reported has isolated herself from family and friends, decreased ADL's.  Pt state today was the first time she showered since 04/23/14.  Pt report the following withdrawal symptoms:  Shakes, rocking, nausea, diarrhea, muscle cramps, sweats.  Pt state "I can't believe this, it's horrific, the worst thing i've ever been through".  Pt report constant suicidal thoughts and state "I can't live like this" when asked if she has a plan.  Pt state during the last few days "I been thinking about hurting myself and getting it over with".  Pt informed ED Nurse Effie Shy that she will stab herself or run out in front of a car.  Pt reports a history of suicidal thoughts, no plan, no attempts.  Pt denies  homicidal thoughts.  Pt denies experiencing symptoms of psychosis.  Pt reportedly contacted [facility] to follow up with Dr. Lolita Lenz who was not available.  Pt reportedly explained her symptoms to an employee of [facility]ie Shy and was informed to "cope with it".  Pt state her husband, mother and sister are supportive.  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  Pt denies alcohol use and report occasional marijuana use.  Pt describes her mood as "scared, I don't know what's coming next".  Pt reportedly is diagnosed with a seizure disorder and lupus.            Level of Care Disposition:  Clinician consulted with the attending ED PA, Charolett Bumpers, and the [facility] PA for Psychiatry, Alla German.  CJ recommended pt be admitted to the Crisis Stabilization Unit at [facility].  PA Bann informed as well as pt.  Pt denies current suicidal thoughts/plan/intent as she was administered ativan and zofran in the ED and state "I feel normal".  Pt is concerned about her treatment at the Crisis St[facility]ization Unit as she prefer to be placed on a low dose  of Valium then winged off.  Pt would like to know what her specific treatment at [facility] will be.  CJ updated on pts mood, denial of current suicidal thoughts and concern regarding treatment at [facility].  CJ also consulted with the Crisis Stabilization Unit to gain additional information on pt and [facility]. CJ recommend the ED PA prescribe pt Valium 5mg  and Ambien 5mg  for a week and pt can follow up with Dr. Lolita Lenzhoke this week.  The ED PA, Charolett Bumpersonald Bann, and pt are in agreement.  Clinician attempted to coordinate PA Bann with CJ, however PA Linward NatalBann is recommending prescribed Valium, Ambien and follow up with Dr. Lolita Lenzhoke.   Pt to be discharged     Insurance Precertification Authorization:

## 2014-05-02 NOTE — ED Notes (Signed)
Pt to ER room 25 with complaints of suicidal thoughts.  Pt states Dr took her off of [place] with out tapering her down and since then she has not slept or ate.  Pt then started having thoughts of suicide.  Pt tearful with RN.  States she wants to stab herself or run out in front a car.  Husband at bedside.  BAC in with pt     Lilla Shook, RN  05/02/14 1150

## 2014-05-02 NOTE — ED Notes (Signed)
Pt states she is feeling a lot better now and doesn't have any desire to kill herself. Gave pt and her husband food.     Pershing Proud, RN  05/02/14 [phone]

## 2014-05-02 NOTE — ED Provider Notes (Signed)
[facility]      CHIEF COMPLAINT       Chief Complaint   Patient presents with   ??? Suicidal       Nurses Notes reviewed and I agree except as noted in the HPI.      HISTORY OF PRESENT ILLNESS    Theresa Hobbs is a 48 y.o. female who presents to the emergency department with her spouse.  Patient states she's been on Valium 10 mg for a long period of time for anxiety.  Dr. just [employer] her Valium without taper.  Patient now anxious, fearful, insomnia and anorexia.  States that she was just kill herself because of the symptoms.  She has no homicidal ideations and auditory visual hallucinations.     REVIEW OF SYSTEMS     Review of Systems   Constitutional: Negative for fever, chills, activity change, appetite change and fatigue.   HENT: Negative for congestion, drooling, ear discharge, ear pain, sore throat and trouble swallowing.    Eyes: Negative for pain, discharge and visual disturbance.   Respiratory: Negative for cough, chest tightness and shortness of breath.    Cardiovascular: Negative for chest pain and leg swelling.   Gastrointestinal: Negative for nausea, vomiting, abdominal pain, diarrhea and constipation.   Endocrine: Negative for cold intolerance and heat intolerance.   Genitourinary: Negative for dysuria, hematuria, flank pain and difficulty urinating.   Musculoskeletal: Negative for joint swelling, arthralgias and neck stiffness.   Skin: Negative for pallor and rash.   Allergic/Immunologic: Negative for immunocompromised state.   Neurological: Negative for dizziness, light-headedness and headaches.   Hematological: Negative for adenopathy.   Psychiatric/Behavioral: Negative for confusion and agitation. The patient is not nervous/anxious.         See HPI        PAST MEDICAL HISTORY    has a past medical history of Cancer ([place]); Chronic kidney disease ([place]); Psychiatric problem; Seizures ([place]); Lupus ([place]); Depression; Anxiety; Fibromyalgia; Post traumatic stress disorder (PTSD); HTN  (hypertension); Diabetes mellitus ([place]); and MRSA (methicillin resistant Staphylococcus aureus).    SURGICAL HISTORY      has past surgical history that includes Cholecystectomy (2000); Nose surgery (2007); Hysterectomy (1998); Colonoscopy (2004); Upper gastrointestinal endoscopy (2004); and Abdominal hernia repair (02-07-13).    CURRENT MEDICATIONS       Previous Medications    ARIPIPRAZOLE (ABILIFY) 10 MG TABLET    Take 10 mg by mouth daily.    HYDROCODONE-ACETAMINOPHEN (NORCO) 5-325 MG PER TABLET    Take 1 tablet by mouth every 6 hours as needed for Pain for up to 30 doses.    LEVETIRACETAM (KEPPRA PO)    Take 500 mg by mouth 2 times daily.    LISINOPRIL (PRINIVIL;ZESTRIL) 20 MG TABLET    Take 20 mg by mouth daily.    METFORMIN (GLUCOPHAGE) 500 MG TABLET    Take 500 mg by mouth 2 times daily (with meals).    METOPROLOL (LOPRESSOR) 25 MG TABLET    Take 25 mg by mouth 2 times daily.    PHENYTOIN (DILANTIN) 100 MG ER CAPSULE    Take 100 mg by mouth 3 times daily. May increase up to 5 100 mg tablets if needed (depending on dilantin level)    PROMETHAZINE (PHENERGAN) 25 MG TABLET    Take 1 tablet by mouth every 8 hours as needed for Nausea for up to 30 doses.    VENLAFAXINE (EFFEXOR-XR) 150 MG XR CAPSULE    Take 150 mg  by mouth daily.       ALLERGIES     is allergic to imitrex; iv dye; amoxicillin-pot clavulanate; azithromycin; bactrim; cefuroxime axetil; ciprofloxacin; erythromycin; flagyl; ketorolac tromethamine; klonopin; levofloxacin; nubain; other; pcn; reglan; sulfa antibiotics; and tramadol.    FAMILY HISTORY     is adopted. family history includes Cancer in her father and mother; Osteoporosis in her mother. She was adopted.    SOCIAL HISTORY      reports that she has been smoking Cigarettes.  She has a .6 pack-year smoking history. She has never used smokeless tobacco. She reports that she does not drink alcohol or use illicit drugs.    PHYSICAL EXAM     INITIAL VITALS:  oral temperature is 98.3 ??F (36.8 ??C).  Her blood pressure is 134/98 and her pulse is 100. Her respiration is 22 and oxygen saturation is 95%.    Physical Exam   Constitutional: She is oriented to person, place, and time. She appears well-developed and well-nourished. No distress.   HENT:   Head: Normocephalic and atraumatic.   Mouth/Throat: Oropharynx is clear and moist.   Eyes: Conjunctivae are normal. Pupils are equal, round, and reactive to light. Right eye exhibits no discharge. Left eye exhibits no discharge.   Neck: Normal range of motion. Neck supple. No tracheal deviation present.   Cardiovascular: Normal rate and regular rhythm.    Pulmonary/Chest: Effort normal and breath sounds normal. No respiratory distress.   Abdominal: Soft. Bowel sounds are normal. There is no tenderness. There is no guarding.   Musculoskeletal: Normal range of motion. She exhibits no edema.   Neurological: She is alert and oriented to person, place, and time. No cranial nerve deficit.   Skin: Skin is warm and dry. No rash noted. She is not diaphoretic. No pallor.   Psychiatric: She has a normal mood and affect. Her behavior is normal. Thought content normal.   Nursing note and vitals reviewed.      DIFFERENTIAL DIAGNOSIS:   Benzodiazepine withdraw, anxiety    DIAGNOSTIC RESULTS     EKG: All EKG's are interpreted by the Emergency Department Physician who either signs or Co-signs this chart in the absence of a cardiologist.  None    RADIOLOGY: non-plain film images(s) such as CT, Ultrasound and MRI are read by the radiologist.  Plain radiographic images are visualized and preliminarily interpreted by the emergency physician unless otherwise stated below.  None    LABS:   Labs Reviewed   CBC WITH AUTO DIFFERENTIAL - Abnormal; Notable for the following:     WBC 11.7 (*)     RBC 6.24 (*)     Hemoglobin 18.2 (*)     Hematocrit 54.0 (*)     Segs Absolute 8.9 (*)     All other components within normal limits   BASIC METABOLIC PANEL - Abnormal; Notable for the following:      Chloride 96 (*)     All other components within normal limits   MICROSCOPIC URINALYSIS - Abnormal; Notable for the following:     Bilirubin Urine SMALL (*)     Ketones, Urine 80 (*)     Blood, Urine MODERATE (*)     Character, Urine CLOUDY (*)     All other components within normal limits   ANION GAP - Abnormal; Notable for the following:     Anion Gap 23.1 (*)     All other components within normal limits   GLOMERULAR FILTRATION RATE, ESTIMATED - Abnormal; Notable  for the following:     Est, Glom Filt Rate 59 (*)     All other components within normal limits   ETHANOL   URINE DRUG SCREEN   ICTOTEST, URINE   OSMOLALITY       EMERGENCY DEPARTMENT COURSE:   Vitals:    Filed Vitals:    05/02/14 1146   BP: 134/98   Pulse: 100   Temp: 98.3 ??F (36.8 ??C)   TempSrc: Oral   Resp: 22   SpO2: 95%     H&P was obtained.  Labs were ordered results reviewed.  Patient is given Ativan 1 mg ??2 by mouth emergency department.  Mental health presented emergency department to interview patient case was discussed with psychiatrist who recommended repeat put her back on her Valium 5 mg daily and Ambien 5 mg nightly.  Patient does have a past medical history of seizures and just DC and his benzodiazepines abruptly will lower her seizure threshold    CRITICAL CARE:   None    CONSULTS:      PROCEDURES:  None    FINAL IMPRESSION      1. Benzodiazepine withdrawal, with unspecified complication [facility])          DISPOSITION/PLAN   Discharge    PATIENT REFERRED TO:  [facility].            DISCHARGE MEDICATIONS:  New Prescriptions    DIAZEPAM (VALIUM) 5 MG TABLET    Take 1 tablet by mouth every 8 hours as needed for Anxiety    ZOLPIDEM (AMBIEN) 5 MG TABLET    Take 1 tablet by mouth nightly as needed for Sleep       (Please note that portions of this note were completed with a voice recognition program.  Efforts were made to edit the dictations but occasionally words are mis-transcribed.)    Lajean Silvius, PA              Lajean Silvius, [place]  05/02/14 1505

## 2014-06-25 ENCOUNTER — Encounter: Payer: MEDICAID | Attending: Neurology | Primary: Family

## 2015-11-23 ENCOUNTER — Inpatient Hospital Stay
Admit: 2015-11-23 | Discharge: 2015-11-24 | Disposition: A | Discharge status: Home / Self Care | Attending: Emergency Medicine

## 2015-11-23 DIAGNOSIS — W540XXA Bitten by dog, initial encounter: Secondary | ICD-10-CM

## 2015-11-23 MED ORDER — BACITRACIN 500 UNIT/GM EX OINT
500 UNIT/GM | Freq: Once | CUTANEOUS | Status: DC
Start: 2015-11-23 — End: 2015-11-23

## 2015-11-23 NOTE — ED Triage Notes (Signed)
Ambulated to room #5, has puncture wound palm right hand

## 2015-11-23 NOTE — ED Provider Notes (Addendum)
[facility]  Urgent Care Encounter      CHIEF COMPLAINT       Chief Complaint   Patient presents with   ??? Animal Bite     right palm       Nurses Notes reviewed and I agree except as noted in the HPI.  HISTORY OF PRESENT ILLNESS   Joi A EaMadaline Guthrieston is a 50 y.o. female who presents 4 hours after she sustained a dog bite to the right palmar hand.  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  She rates pain at 5 out of 10 severity, worse with movements of the hand.  Moderate swelling at the base of the right thumb.  No motor sensory deficits.  Bleeding controlled with direct pressure.  She is right-hand dominant.  Up-to-date on tetanus.  Dog is up-to-date on rabies.  History of diabetes with good control, quit smoking one year ago.  REVIEW OF SYSTEMS     Review of Systems   Constitutional: Negative for appetite change, chills, fatigue, fever and unexpected weight change.   HENT: Negative for congestion, ear discharge, ear pain, sinus pressure, sneezing, sore throat, trouble swallowing and voice change.    Eyes: Negative for pain, discharge and redness.   Respiratory: Negative for cough, shortness of breath, wheezing and stridor.    Cardiovascular: Negative for chest pain and leg swelling.   Gastrointestinal: Negative for abdominal pain, diarrhea, nausea and vomiting.   Genitourinary: Negative for dysuria, frequency, hematuria and urgency.   Musculoskeletal: Negative for arthralgias, back pain, myalgias and neck pain.   Skin: Positive for wound. Negative for rash.        Dogbite proximal right palmar hand   Neurological: Negative for dizziness, syncope, weakness and headaches.   Hematological: Negative for adenopathy.   Psychiatric/Behavioral: Negative for behavioral problems, confusion, sleep disturbance and suicidal ideas. The patient is not nervous/anxious.    All other systems reviewed and are negative.      PAST MEDICAL HISTORY         Diagnosis Date   ???  Anxiety    ??? Asthma    ??? Cancer (HCC)      kidney    ??? Chronic kidney disease    ??? Depression    ??? Diabetes mellitus (HCC)      type 2    ??? Fibromyalgia    ??? HTN (hypertension)    ??? Lupus (HCC)    ??? MRSA (methicillin resistant Staphylococcus aureus)    ??? Post traumatic stress disorder (PTSD)    ??? Psychiatric problem    ??? Seizures (HCC)      Pt voiced she has seizures.       SURGICAL HISTORY     Patient  has a past surgical history that includes Cholecystectomy (2000); Nose surgery (2007); Hysterectomy (1998); Colonoscopy (2004); Upper gastrointestinal endoscopy (2004); and Abdominal hernia repair (02-07-13).    CURRENT MEDICATIONS       Discharge Medication List as of 11/23/2015  8:03 PM      CONTINUE these medications which have NOT CHANGED    Details   metFORMIN (GLUCOPHAGE) 500 MG tablet Take 500 mg by mouth 2 times daily (with meals).      LevETIRAcetam (KEPPRA PO) Take 500 mg by mouth 2 times daily.      venlafaxine (EFFEXOR-XR) 150 MG XR capsule Take 150 mg by mouth daily.      phenytoin (DILANTIN) 100  MG ER capsule Take 100 mg by mouth 3 times daily. May increase up to 5 100 mg tablets if needed (depending on dilantin level)             ALLERGIES     Patient is is allergic to imitrex [sumatriptan]; iv dye [iodides]; amoxicillin-pot clavulanate; azithromycin; bactrim; cefuroxime axetil; ciprofloxacin; erythromycin; flagyl [metronidazole]; ketorolac tromethamine; klonopin [clonazepam]; levofloxacin; nubain [nalbuphine hcl]; other; pcn [penicillins]; reglan [metoclopramide]; sulfa antibiotics; and tramadol.    FAMILY HISTORY     Patient's family history includes Cancer in her father and mother; Osteoporosis in her mother. She was adopted.    SOCIAL HISTORY     Patient  reports that she has been smoking Cigarettes.  She has a 0.60 pack-year smoking history. She has never used smokeless tobacco. She reports that she does not drink alcohol or use illicit drugs.    PHYSICAL EXAM     ED TRIAGE VITALS  BP: (!) 148/70,  Temp: 98.9 ??F (37.2 ??C), Pulse: 89, Resp: 16, SpO2: 97 %  Physical Exam   Constitutional: She is oriented to person, place, and time. She appears well-developed and well-nourished. No distress.   Moist membranes, normal airway   HENT:   Head: Normocephalic and atraumatic.   Right Ear: Tympanic membrane and external ear normal.   Left Ear: Tympanic membrane and external ear normal.   Nose: Nose normal.   Mouth/Throat: Oropharynx is clear and moist. No trismus in the jaw. No uvula swelling. No oropharyngeal exudate, posterior oropharyngeal edema, posterior oropharyngeal erythema or tonsillar abscesses.   Eyes: Conjunctivae and EOM are normal. Pupils are equal, round, and reactive to light. Right eye exhibits no discharge. Left eye exhibits no discharge. No scleral icterus.   Neck: Normal range of motion. No JVD present. No thyromegaly present.   Cardiovascular: Normal rate, regular rhythm, S1 normal, S2 normal, normal heart sounds, intact distal pulses and normal pulses.  Exam reveals no gallop and no friction rub.    No murmur heard.  Pulmonary/Chest: Effort normal and breath sounds normal. No stridor. No tachypnea. No respiratory distress. She has no decreased breath sounds. She has no wheezes. She has no rhonchi. She has no rales. She exhibits no tenderness.   Abdominal: Soft. Bowel sounds are normal. She exhibits no distension and no mass. There is no tenderness. There is no rebound and no guarding.   Musculoskeletal: Normal range of motion. She exhibits no edema.        Right hand: She exhibits tenderness, laceration and swelling. She exhibits normal range of motion, no bony tenderness, normal two-point discrimination, normal capillary refill and no deformity. Normal sensation noted. Normal strength noted.        Hands:  Lymphadenopathy:     She has cervical adenopathy.        Right cervical: Superficial cervical adenopathy present.        Left cervical: Superficial cervical adenopathy present.   Neurological:  She is alert and oriented to person, place, and time. She has normal reflexes. No cranial nerve deficit. She exhibits normal muscle tone. Coordination normal.   Appropriate, no focal findings.  Distal neurovascular intact right upper extremity   Skin: Skin is warm and dry. No rash noted. She is not diaphoretic. No erythema.   Psychiatric: She has a normal mood and affect. Her behavior is normal. Judgment and thought content normal.   Nursing note and vitals reviewed.      DIAGNOSTIC RESULTS   Labs:No results found for this  visit on 11/23/15.    IMAGING:  No orders to display     URGENT CARE COURSE:     Vitals:    11/23/15 1828 11/23/15 2010   BP:  (!) 148/70   Pulse: 89    Resp: 16    Temp: 98.9 ??F (37.2 ??C)    TempSrc: Temporal    SpO2: 97%    Weight: 250 lb (113.4 kg)    Height:  (1.575 m)        Medications   bacitracin zinc 500 UNIT/GM ointment (  Given 11/23/15 2016)    tolerated Well, improved  PROCEDURES:  Dogbite was cleaned with wound cleansing spray and bacitracin ointment applied with circumferential gauze dressing and 3 inch Ace wrap.  Neurovascular intact.  FINAL IMPRESSION      1. Dog bite of right hand without complication, initial encounter    2. Diabetes mellitus type 2 in obese (HCC)        DISPOSITION/PLAN   DISPOSITION nontoxic, well-hydrated, normal airway.  Patient has superficial dog bite of the proximal right palmar hand-right thenar eminence.  Wound has been cleaned and dressed.  No neurovascular complications or deep structure injuries.  No foreign body.  Will treat with Cleocin and Bactroban ointment after peroxide cleaning, using tylenol for pain.  She is to avoid use of the right hand and maintain RICE treatment.  Work restrictions were documented in writing and patient instructed to follow-up with occupational health ASAP.  She understands to go to ED if worse.  PATIENT REFERRED TO:  [facility]  [address]  [place] [address]  [phone]  Schedule an appointment  as soon as possible for a visit in 3 days  Recheck occupational health ASAP    DISCHARGE MEDICATIONS:  Discharge Medication List as of 11/23/2015  8:03 PM      START taking these medications    Details   clindamycin (CLEOCIN) 150 MG capsule Take 1 capsule by mouth 3 times daily for 7 days, Disp-21 capsule, R-0Print      mupirocin (BACTROBAN) 2 % ointment Apply topically 3 times daily.  Apply after peroxide cleaning and redress, Disp-22 g, R-0, Print           Discharge Medication List as of 11/23/2015  8:03 PM          Robley Fries, MD         Robley Fries, MD  11/23/15 9147       Robley Fries, MD  11/24/15 [phone]

## 2015-11-24 MED ORDER — MUPIROCIN 2 % EX OINT
2 % | CUTANEOUS | 0 refills | Status: AC
Start: 2015-11-24 — End: 2015-11-30

## 2015-11-24 MED ORDER — CLINDAMYCIN HCL 150 MG PO CAPS
150 MG | ORAL_CAPSULE | Freq: Three times a day (TID) | ORAL | 0 refills | Status: DC
Start: 2015-11-24 — End: 2015-11-29

## 2015-11-24 MED ORDER — BACITRACIN ZINC 500 UNIT/GM EX OINT
500 UNIT/GM | CUTANEOUS | Status: AC
Start: 2015-11-24 — End: 2015-11-23
  Administered 2015-11-24

## 2015-11-24 MED FILL — BACITRACIN ZINC 500 UNIT/GM EX OINT: 500 UNIT/GM | CUTANEOUS | Qty: 1

## 2015-11-25 ENCOUNTER — Ambulatory Visit: Admit: 2015-11-25 | Primary: Family

## 2015-11-25 ENCOUNTER — Inpatient Hospital Stay: Attending: Nurse Practitioner | Primary: Family

## 2015-11-25 ENCOUNTER — Encounter

## 2015-11-25 DIAGNOSIS — Z0000 Encounter for general adult medical examination without abnormal findings: Secondary | ICD-10-CM

## 2015-11-26 ENCOUNTER — Inpatient Hospital Stay
Admit: 2015-11-26 | Discharge: 2015-11-29 | Disposition: A | Discharge status: Home / Self Care | Attending: Orthopaedic Surgery | Admitting: Orthopaedic Surgery

## 2015-11-26 ENCOUNTER — Encounter: Attending: Orthopaedic Surgery | Primary: Family

## 2015-11-26 DIAGNOSIS — L03119 Cellulitis of unspecified part of limb: Secondary | ICD-10-CM

## 2015-11-26 LAB — CBC WITH AUTO DIFFERENTIAL
Basophils Absolute: 0.1 10*3/uL (ref 0.0–0.1)
Basophils: 0.6 %
Eosinophils Absolute: 0.4 10*3/uL (ref 0.0–0.4)
Eosinophils: 4 %
Hematocrit: 44.8 % (ref 37.0–47.0)
Hemoglobin: 14.6 gm/dl (ref 12.0–16.0)
Lymphocytes Absolute: 3.7 10*3/uL (ref 1.0–4.8)
Lymphocytes: 35.9 %
MCH: 27.9 pg (ref 27.0–31.0)
MCHC: 32.5 gm/dl — ABNORMAL LOW (ref 33.0–37.0)
MCV: 85.7 fL (ref 81.0–99.0)
MPV: 10.6 mcm — ABNORMAL HIGH (ref 7.4–10.4)
Monocytes Absolute: 0.6 10*3/uL (ref 0.4–1.3)
Monocytes: 6.3 %
Platelets: 223 10*3/uL (ref 130–400)
RBC Morphology: NORMAL
RBC: 5.23 10*6/uL (ref 4.20–5.40)
RDW: 14.1 % (ref 11.5–14.5)
Seg Neutrophils: 53.2 %
Segs Absolute: 5.4 10*3/uL (ref 1.8–7.7)
WBC: 10.2 10*3/uL (ref 4.8–10.8)
nRBC: 0 /100 wbc

## 2015-11-26 LAB — BASIC METABOLIC PANEL
BUN: 18 mg/dl (ref 7–22)
CO2: 25 meq/l (ref 23–33)
Calcium: 9 mg/dl (ref 8.5–10.5)
Chloride: 102 meq/l (ref 98–111)
Creatinine: 0.8 mg/dl (ref 0.4–1.2)
Glucose: 92 mg/dl (ref 70–108)
Potassium: 4.1 meq/l (ref 3.5–5.2)
Sodium: 142 meq/l (ref 135–145)

## 2015-11-26 LAB — SEDIMENTATION RATE: Sed Rate: 3 mm/hr (ref 0–20)

## 2015-11-26 LAB — ANION GAP: Anion Gap: 15 meq/l (ref 8.0–16.0)

## 2015-11-26 LAB — GLOMERULAR FILTRATION RATE, ESTIMATED: Est, Glom Filt Rate: 76 mL/min/{1.73_m2} — AB

## 2015-11-26 LAB — C-REACTIVE PROTEIN: CRP: 0.36 mg/dl (ref 0.00–1.00)

## 2015-11-26 MED ORDER — ONDANSETRON HCL 4 MG/2ML IJ SOLN
4 MG/2ML | Freq: Four times a day (QID) | INTRAMUSCULAR | Status: DC | PRN
Start: 2015-11-26 — End: 2015-11-29
  Administered 2015-11-26 – 2015-11-29 (×8): 4 mg via INTRAVENOUS

## 2015-11-26 MED ORDER — MORPHINE SULFATE (PF) 2 MG/ML IV SOLN
2 MG/ML | INTRAVENOUS | Status: DC | PRN
Start: 2015-11-26 — End: 2015-11-26

## 2015-11-26 MED ORDER — SODIUM CHLORIDE 0.9 % IV SOLN
0.9 % | INTRAVENOUS | Status: DC
Start: 2015-11-26 — End: 2015-11-29
  Administered 2015-11-26: 22:00:00 via INTRAVENOUS

## 2015-11-26 MED ORDER — VANCOMYCIN INTERMITTENT DOSING (PLACEHOLDER)
INTRAVENOUS | Status: DC
Start: 2015-11-26 — End: 2015-11-29

## 2015-11-26 MED ORDER — CLINDAMYCIN PHOSPHATE IN D5W 600 MG/50ML IV SOLN
600 MG/50ML | Freq: Three times a day (TID) | INTRAVENOUS | Status: DC
Start: 2015-11-26 — End: 2015-11-29
  Administered 2015-11-26 – 2015-11-29 (×8): 600 mg via INTRAVENOUS

## 2015-11-26 MED ORDER — VANCOMYCIN HCL 1000 MG IV SOLR
1000 MG | Freq: Two times a day (BID) | INTRAVENOUS | Status: DC
Start: 2015-11-26 — End: 2015-11-29
  Administered 2015-11-27 – 2015-11-29 (×4): 1500 mg via INTRAVENOUS

## 2015-11-26 MED ORDER — GENTAMICIN SULFATE 40 MG/ML IJ SOLN
40 | INTRAMUSCULAR | Status: AC
Start: 2015-11-26 — End: 2015-11-26

## 2015-11-26 MED ORDER — MORPHINE SULFATE (PF) 2 MG/ML IV SOLN
2 MG/ML | INTRAVENOUS | Status: DC | PRN
Start: 2015-11-26 — End: 2015-11-26
  Administered 2015-11-26: 22:00:00 2 mg via INTRAVENOUS

## 2015-11-26 MED ORDER — ACETAMINOPHEN 325 MG PO TABS
325 MG | ORAL | Status: DC | PRN
Start: 2015-11-26 — End: 2015-11-29
  Administered 2015-11-26: 23:00:00 650 mg via ORAL

## 2015-11-26 MED ORDER — VANCOMYCIN HCL 1000 MG IV SOLR
1000 MG | Freq: Once | INTRAVENOUS | Status: AC
Start: 2015-11-26 — End: 2015-11-26
  Administered 2015-11-26: 22:00:00 1500 mg via INTRAVENOUS

## 2015-11-26 MED ORDER — CIPROFLOXACIN IN D5W 400 MG/200ML IV SOLN
400 MG/200ML | Freq: Two times a day (BID) | INTRAVENOUS | Status: DC
Start: 2015-11-26 — End: 2015-11-26

## 2015-11-26 MED FILL — GENTAMICIN SULFATE 40 MG/ML IJ SOLN: 40 MG/ML | INTRAMUSCULAR | Qty: 2

## 2015-11-26 MED FILL — CLEOCIN IN D5W 600 MG/50ML IV SOLN: 600 MG/50ML | INTRAVENOUS | Qty: 50

## 2015-11-26 MED FILL — ACETAMINOPHEN 325 MG PO TABS: 325 MG | ORAL | Qty: 2

## 2015-11-26 MED FILL — MORPHINE SULFATE (PF) 2 MG/ML IV SOLN: 2 MG/ML | INTRAVENOUS | Qty: 1

## 2015-11-26 MED FILL — ONDANSETRON HCL 4 MG/2ML IJ SOLN: 4 MG/2ML | INTRAMUSCULAR | Qty: 2

## 2015-11-26 MED FILL — VANCOMYCIN HCL 5000 MG IV SOLR: 5000 MG | INTRAVENOUS | Qty: 1500

## 2015-11-26 MED FILL — MORPHINE SULFATE (PF) 2 MG/ML IV SOLN: 2 mg/mL | INTRAVENOUS | Qty: 1

## 2015-11-26 NOTE — H&P (Signed)
[facility]                       [address]  [place], [place]  [id number]                                 HISTORY AND PHYSICAL    PATIENT NAME:  Theresa Hobbs, Theresa A.                    DOB:      07-Jun-1966  MED REC NO:    [id number]                        [place]:     [place]   ACCOUNT NO:    [id number]                          ADMISSION DATE: 11/26/2015  PHYSICIAN:     Sandria SenterMichael Muha, M.D.                  Dictating for Dr. Muha/Dr. Arneta ClicheSanko.      PRIMARY CARE PROVIDER:  Cornelious BryantKelsey Askins CNP    CHIEF COMPLAINT:  Right hand dog bite injury.    HISTORY OF PRESENT ILLNESS:  The patient is a 50 year old lady who sustained  an injury when at work on Tuesday, 11/23/2015.  She was grooming a dog. It was  a [place] that bit at his leash, it sounds like, and in the process of  doing so he also bit her hand. She had a puncture injury to the palm side of  the hand, along the hypothenar region.  She had pain and discomfort, and later  on that day she [facility] to [facility], where they obtained x-rays and did not see  any obvious fractures or foreign bodies and placed the patient on Cleocin  because of her multiple drug allergies.  The patient also has a known history  of underlying diabetes mellitus, which she is currently on metformin for, and  monitored and followed her along to see how she would respond to treatment.   They had seen her yesterday and were concerned that the hand was looking a  little more swollen and had her followed up and closely monitored and repeated  followup today.    On examination today, she had quite a bit of redness, swelling and discomfort.   She had a lot of guarding and irritability, so they recommended referral to  an orthopedic doctor and presented on referral to the ER.  Dr. Arneta ClicheSanko was  notified, and he elected to see her through office call.  In the process of  evaluating the patient, given her history and the amount of tenderness, pain  and discomfort that she was having  today, he felt emergent surgery was  warranted.      PAST MEDICAL HISTORY:  Diabetes mellitus type 2, history of seizure disorder,  history of fibromyalgia, anxiety/depression, gastroesophageal reflux disease.    CURRENT MEDICATIONS:  Metformin, Effexor, ____, ranitidine, trazodone and  Vistaril.    ALLERGIES:  IMITREX, ANAPHYLAXIS; IV DYE, ANAPHYLAXIS; AUGMENTIN HIVES,  ITCHING; AZITHROMYCIN HIVES, ITCHING; BACTRIM HIVES, ITCHING; CEPHALOSPORIN,  CEFUROXIME HIVES; CIPRO HIVES, ITCHING; ERYTHROMYCIN HIVES, ITCHING; FLAGYL  HIVES, ITCHING; KLONOPIN HIVES, ITCHING; LEVOFLOXACIN HIVES, ITCHING; NUBAIN  BLISTERS, PENICILLIN HIVES, ITCHING; REGLAN, BLISTERS; SULFA, HIVES; TRAMADOL,  HIVES; STADOL, BLISTERS.      SOCIAL HISTORY:  The patient is a smoker, approximately 3 cigarettes per day  for the last 10 years.    PAST SURGICAL HISTORY:  Herniorrhaphy, hysterectomy, nasal surgery and  laparoscopy. No problems with anesthesia in the past.    PHYSICAL EXAMINATION:  GENERAL:  A well-developed, well-nourished lady in no acute distress.  She is  definitely in a lot of pain and discomfort, favoring the right upper  extremity/hand.   LUNGS:  Diffuse rhonchi.  HEART:  Regular rate and rhythm.  ABDOMEN:  Bowel sounds positive.  EXTREMITIES:  Right hand revealed diffuse swelling in the hand.  Mild  erythema, especially on the palmar aspect.  One puncture injury along the  hypothenar region.  Pain on passive stretch of all the fingers. Complaining  also of numbness and tingling.  No increased edema.    IMPRESSION: At this point in time is right hand dog bite injury with  hypothenar, deep thenar infection, acute carpal tunnel syndrome also.    RECOMMENDATIONS AND PLAN:  Dr. Dellie Catholic has been contacted and notified.  The  patient was admitted to the hospital, with orders sent over at this time for a  consult for infectious disease for appropriate IV antibiotic coverage.  Given  the patient's multiple drug allergies, it may be difficult  to get appropriate  coverage; will find the most appropriate antibiotic from their standpoint and  potential surgery later on by Dr. Dellie Catholic, which would be right hand I and D and  possible carpal tunnel release.      Dictated by: Suzan Nailer, P.A.-C.      Sandria Senter, M.D.      D: 11/26/2015 13:52:14  T: 11/26/2015 14:24:51  TO/nts  Job#: [id number]  Doc#: [id number]

## 2015-11-26 NOTE — Other (Signed)
11/26/15 2015   Mobility   Patient Departure From Unit 2015  (To OR per transport)

## 2015-11-26 NOTE — Other (Signed)
11/26/15 1500   Encounter Summary   Services provided to: Patient   Referral/Consult From: Nurse   Continue Visiting No   Complexity of Encounter Low   Length of Encounter 15 minutes   Advance Directives (For Healthcare)   Information on Healthcare Directives Requested Yes   Patient Requests Assistance Yes, will do independently   Spiritual Consult for Advance Directives.  Patient took the documents but shared she will do them independently.  No follow up needed at this time.

## 2015-11-26 NOTE — Consults (Signed)
[facility]                       [address] [place]  [address]                                     CONSULTATION    PATIENT NAME:  Theresa Hobbs, Theresa A.                    DOB:      09-17-1965  MED REC NO:    [id number]                        ROOM:     5K [id number]  ACCOUNT NO:    [id number]                          ADMISSION DATE: 11/26/2015  PHYSICIAN:     Chrisandra Netters, M.D.                DATE OF CONSULTATION:  11/26/2015    REQUESTING PHYSICIAN:  Sandria Senter, MD    REASON FOR CONSULTATION:  Right hand cellulitis.    HISTORY OF PRESENT ILLNESS:  The patient is a nice 50 year old female who has  had problems with her hand.  The patient had her hand bitten by her own dog on  11/23/2015 as she was grooming it.  This bite was on the palmar aspect of the  hand.  This was a puncture injury.  The patient is a known diabetic with a  history of multiple allergies.  Planned to be taken for surgery.    The patient was seen earlier this afternoon exam.    PAST MEDICAL HISTORY:  Significant for diabetes, history of seizure disorder,  fibromyalgia, anxiety, GERD, multiple medication allergies.    PAST SURGICAL HISTORY:  History of herniorrhaphy, hysterectomy, nasal surgery.    ALLERGIES:  Multiple medication allergies include AUGMENTIN, causing hives;  ZITHROMAX, causing hives; BACTRIM, causing hives; CEPHALOSPORIN, CEFUROXIME,  causing hives; CIPRO, causing hives; ERYTHROMYCIN, causing hives; FLAGYL,  causing hives; KLONOPIN, causing hives; LEVAQUIN, causing hives; PENICILLIN,  causing hives; REGLAN, causing blisters; SULFA, causing hives; TRAMADOL,  causing hives; STADOL, causing blisters.    SOCIAL HISTORY:  Smoker, 3 cigarettes a day.  Does not want to quit.      PHYSICAL EXAMINATION:    VITAL SIGNS:  Blood pressure 183/89, pulse rate is 89, temperature 98.1.   Weight 210 pounds, BMI 38.5.  HEENT:  Head is atraumatic, normocephalic.  Pupils equal and reactive.  No ulcers noted in mouth  or throat.  NECK:  Supple.  No lymphadenopathy.  LUNGS:  Air entry positive.  No wheeze or crepitus.  ABDOMEN:  Soft, nontender.  Bowel sounds positive.  EXTREMITIES:  Right hand swelling with significant right hand tenderness on  the palmar aspect.  The patient not able to make a complete fist.    LABORATORY/IMAGING DATA:  Reviewing the patient's laboratories, WBC 10.2,  hemoglobin 14.6 and platelets of 223.  BUN is 18, creatinine is 0.08.  Sodium  is 142.    DIAGNOSTIC IMPRESSION:  1.  Right hand abscess/tenosynovitis.  2.  Right hand cellulitis.  3.  status post dog bite on 11/23/2015, for surgical I and D on 11/26/2015.  4.  Multiple antibiotic  allergies and cephalosporin causes hives.  5.  Diabetes mellitus, type 2.  6.  Obesity, class 2.    RECOMMENDATIONS:  The patient does have problems with multiple penicillin and  cephalosporin allergies causing hives, even Cipro causing hives.  Will plan to  start the patient on vancomycin and clindamycin and add Azactam.  Care plan  and course of treatment discussed with the patient and nursing staff.    Follow the OR cultures.        Chrisandra Nettersavi Cristle Jared, M.D.      D: 11/26/2015 21:05:14  T: 11/26/2015 22:16:19  RK/nts  Job#: [id number]  Doc#: [id number]

## 2015-11-26 NOTE — H&P (Signed)
[facility]                       [address] [place]  [address]                                 HISTORY AND PHYSICAL    PATIENT NAME:  Theresa Hobbs, Theresa A.                    DOB:      10-29-1965  MED REC NO:    [id number]                        ROOM:     5K [id number]  ACCOUNT NO:    [id number]                          ADMISSION DATE: 11/26/2015  PHYSICIAN:     Enid Skeens, PA-C                   REASON FOR ADMISSION:  Right hand pain status post dog bite.     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  On  11/23/2015 while at work, she was grooming a [place].  After she groomed  him, she was training him with a lead line, he turned to grab the lead line  when he penetrated her hand.  She did not think much of it at the time of the  injury; however, later was evaluated at [facility] urgent care where they  provided her with an oral antibiotic.  She has not noted much improvement over  the last 3 days; in fact, she notes that her pain is increasing, notes  stiffness, pain with range of motion.    She was evaluated by Dr. Arneta Cliche in office today, who felt her exam warranted  admission with IV antibiotics and possible I and D of the wound.    PAST MEDICAL HISTORY:  1.  Seizures.  2.  Post-traumatic stress nor disorder.  3.  History of MRSA, status post hernia repair.  4.  Lupus, hypertension, fibromyalgia, diabetes, depression, chronic kidney  disease, asthma, anxiety.    PAST SURGICAL HISTORY:  Hernia repair, hysterectomy, cholecystectomy.    SOCIAL HISTORY:  Current smoker.    PHYSICAL EXAMINATION:  GENERAL:  She is alert and oriented x3, modestly dressed, appears stated age,  appears not in distress.  CARDIAC:  Regular.  LUNGS:  Clear.  EXTREMITIES:  Right hand has about a 1 cm penetration wound at the medial base  of the thenar eminence.  There is no active  drainage.  There is swelling.  It  is distinctly tender.  She has flexion posturing of her digits with swelling  into those as well.  She has tenderness throughout each digit flexor sheath.   She has pain with passive extension of the digits.  She is also diffusely  tender at the wrist and has incomplete range of motion.  No induration or  streaking.  NEUROLOGIC:  Digits are sensate.  VASCULAR:  Good radial pulse.    X-rays done on 11/25/2015 reviewed right now, no evidence of acute fracture.   No foreign  body.  Soft tissue swelling.  May have a little bit of air within  the flexor sheath of the index finger.    ASSESSMENT:  History, exam, and x-rays are consistent with a work-related  injury, dog bite that resulted in septic flexor tenosynovitis of the right  hand.  The concern is that the penetration went down into the carpal tunnel  and has disbursed into the flexor sheath of each digit.  She has diffuse  tenderness, flexion posturing in the digits, Kanavel signs particularly over  the radial 3 digits.      X-rays without acute injury, there is some suspicion for possible air within  the flexor sheath at the index finger.    PLAN:  Would recommend I and D lavage of the wound, may require incisions into  the flexor sheath of each affected digit.  Risks, benefits, alternatives, need  for further surgery, risk of anesthetic, nerve or vascular injury were all  reviewed.  Will likely leave the wound open to drain, she understands.  Will  obtain consent.        Enid SkeensFaye Felkey, PA-C      D: 11/26/2015 19:13:16  T: 11/26/2015 20:08:12  FF/nts  Job#: [id number]  Doc#: [id number]

## 2015-11-26 NOTE — Brief Op Note (Signed)
Brief Postoperative Note    Theresa RankinsJo A Hobbs  Date of Birth:  09/27/1965  [id number]    Pre-operative Diagnosis: septic tenosynovitis right hand      Post-operative Diagnosis: Same    Procedure: I&D thenar and midpalmar deep space infections    Anesthesia: General    Surgeons/Assistants: Muha      Estimated Blood Loss: less than 50     Complications: None    Specimens: none    Findings: as above    Electronically signed by Blane OharaFaye M Felkey, PA-C on 11/26/2015 at 9:50 PM

## 2015-11-26 NOTE — Progress Notes (Signed)
2200 received patient from surgery into PACU bay #11 on room air .  Pt is screaming and crawling out of bed in pain.  Pt is very impulsive and hard to control in bed, she keeps pulling at her dressing stating it hurts. Pt oxygen saturation 88-90% applied face tent at 6liters, patient ripped off stated I dont need oxygen. Contact percautions   2200 gave 50 mcg of fentanyl per anaesthesia, see MAR for pain 10/10  2205 gave 50 mcg of fentanyl per anaesthesia, see MAR  for pain 10/10  2210 Notified Dr Octavia HeirHeitmeyer that patient not getting any relief from the pain with fentanyl . Dr Octavia HeirHeitmeyer ordered dilaudid IV  2220 gave 0.5mg  of dilaudid per anaesthesia, see MAR  for pain 10/10  2225 gave 0.5mg  of dilaudid per anaesthesia, see MAR  for pain 10/10  2230 gave 0.5mg  of dilaudid per anaesthesia, see MAR  for pain 10/10  2235 gave 0.5mg  of dilaudid per anaesthesia, see MAR  for pain 10/10  2236 notified Dr Octavia HeirHeitmeyer of patient not getting any relief.  Pt is extremely anxious, received order for ativan iv once  2240 gave 50mcg of fentanyl per anaesthesia, see MAR  for pain 10/10  2245 gave 50mcg of fentanyl per anaesthesia, see MAR  for pain 10/10  2250 gave 0.5 mg of ativan, pt extremely anxious and uncooperative pt sitting up in bed crying in pain.    2300 pt is calm, respirations unlabored. Pt allowed us to reposition and elevate right arm and apply ice.  Pt has snorous respirations at times.    2305 Dr Octavia HeirHeitmeyer at bedside agrees to sign out patient when clinically ready, and ordered continous pulse ox.   2310 Called Angie with respiratory for the continuous pulse ox to be set up in room 5K23  2320 Pt eyes closed respirations unlabored.  2325 Pt arouses easily, follows commands and is appropriate. Vss. Pt denies pain and nausea.   2350 Pt is alert follows commands.  Pt has sensation and able to wiggle fingers on right.  Right extremity is elevated with ice. Vss. Pt is clinically ready for discharge.   [facility]  Transported pt to 5K23 on 4 liters face tent.  Pt was alert and awake the entire trip and refused to wear oxygen.  I reminded patient she had some narcotics that they would be continually monitoring oxygen levels that she may need to wear the oxygen when she sleeps.

## 2015-11-26 NOTE — Anesthesia Post-Procedure Evaluation (Signed)
Anesthesia Post-op Note    Patient: Theresa Hobbs  MRN: [id number]  Birthdate: 11/27/1965  Date of evaluation: 11/27/2015  Time:  9:16 AM     Procedure(s) Performed:     Last Vitals:   Visit Vitals   ??? BP (!) 116/58   ??? Pulse 109   ??? Temp 98.6 ??F (37 ??C) (Oral)   ??? Resp 18   ??? Ht 5\' 2"  (1.575 m)   ??? Wt 210 lb (95.3 kg)   ??? SpO2 98%   ??? BMI 38.41 kg/m2       Aldrete Phase I: Aldrete Score: 9    Aldrete Phase II:      Anesthesia Post Evaluation    Final anesthesia type: general  Patient location during evaluation: PACU  Patient participation: complete - patient participated  Level of consciousness: sleepy but conscious and responsive to verbal stimuli  Pain score: 4  Airway patency: patent  Nausea & Vomiting: no nausea and no vomiting  Complications: no  Cardiovascular status: hemodynamically stable and blood pressure returned to baseline  Respiratory status: spontaneous ventilation, face mask and acceptable  Hydration status: stable        Ronalee BeltsJeremy D Shamar Engelmann, DO  9:16 AM

## 2015-11-26 NOTE — Progress Notes (Signed)
Pharmacy Note  Vancomycin Consult    Theresa RankinsJo A Tuma is a 50 y.o. female started on Vancomycin for cellulitis due to dog bite; consult received from Dr. Aletha HalimKamepalli to manage therapy. Also receiving the following antibiotics: clindamycin.    Patient Active Problem List   Diagnosis   ??? Seizure (HCC)   ??? Head ache   ??? Nausea and vomiting   ??? Obesity   ??? Diarrhea   ??? Cancer of kidney (HCC)   ??? PTSD (post-traumatic stress disorder)   ??? Cancer (HCC)   ??? Chronic kidney disease   ??? Psychiatric problem   ??? Seizures (HCC)   ??? Lupus (HCC)   ??? Depression   ??? Anxiety   ??? Fibromyalgia   ??? Post traumatic stress disorder (PTSD)   ??? S/P repair of ventral hernia       Allergies:  Imitrex [sumatriptan]; Iv dye [iodides]; Amoxicillin-pot clavulanate; Azithromycin; Bactrim; Cefuroxime axetil; Ciprofloxacin; Erythromycin; Flagyl [metronidazole]; Ketorolac tromethamine; Klonopin [clonazepam]; Levofloxacin; Nubain [nalbuphine hcl]; Other; Pcn [penicillins]; Reglan [metoclopramide]; Sulfa antibiotics; and Tramadol     Temp max: 98.1    Recent Labs      11/26/15   1659   BUN  18       Recent Labs      11/26/15   1659   CREATININE  0.8       Recent Labs      11/26/15   1659   WBC  10.2       No intake or output data in the 24 hours ending 11/26/15 1933    Culture Date Source Results   11/26/15 Aerobic - drawn at [facility]          Ht Readings from Last 1 Encounters:   11/26/15 5\' 2"  (1.575 m)        Wt Readings from Last 1 Encounters:   11/26/15 210 lb (95.3 kg)         Body mass index is 38.41 kg/(m^2).    Estimated Creatinine Clearance: 91 mL/min (based on Cr of 0.8).      Assessment/Plan:  Will initiate vancomycin 1500 mg IV every 12 hours.  Timing of trough level will be determined based on culture results, renal function, and clinical response.      Thank you for the consult.  Will continue to follow.  Nada Maclachlanammy Francy Mcilvaine RPh, BCPS  11/26/2015     7:38 PM'

## 2015-11-26 NOTE — Progress Notes (Signed)
Pt admitted to  5K25 ambulatory.   Complaints: infection.     Vital signs obtained. Assessment and data collection initiated. Oriented to room. All questions answered with no further questions at this time. Fall prevention and safety brochure discussed with patient.   The best day to schedule a follow up Dr appointment is:  Monday a.m.  Larwance RoteBreanna N Hemmelgarn, RN 11/26/2015 3:16 PM

## 2015-11-26 NOTE — Anesthesia Pre-Procedure Evaluation (Signed)
Department of Anesthesiology  Preprocedure Note       Name:  Theresa Hobbs   Age:  50 y.o.  DOB:  07-Nov-1965                                          MRN:  [id number]         Date:  11/26/2015      Surgeon:    Procedure:    Medications prior to admission:   Prior to Admission medications    Medication Sig Start Date End Date Taking? Authorizing Provider   traZODone (DESYREL) 100 MG tablet Take 200 mg by mouth nightly   Yes Historical Provider, MD   hydrOXYzine (VISTARIL) 50 MG capsule Take 50 mg by mouth 3 times daily as needed for Itching   Yes Historical Provider, MD   clindamycin (CLEOCIN) 150 MG capsule Take 1 capsule by mouth 3 times daily for 7 days 11/23/15 11/30/15 Yes Robley Fries, MD   mupirocin (BACTROBAN) 2 % ointment Apply topically 3 times daily.  Apply after peroxide cleaning and redress 11/23/15 11/30/15 Yes Robley Fries, MD   metFORMIN (GLUCOPHAGE) 500 MG tablet Take 500 mg by mouth 2 times daily (with meals).   Yes Historical Provider, MD   LevETIRAcetam (KEPPRA PO) Take 500 mg by mouth 2 times daily.   Yes Historical Provider, MD   venlafaxine (EFFEXOR-XR) 150 MG XR capsule Take 150 mg by mouth daily.   Yes Historical Provider, MD   phenytoin (DILANTIN) 100 MG ER capsule Take 100 mg by mouth 3 times daily. May increase up to 5 100 mg tablets if needed (depending on dilantin level)   Yes Historical Provider, MD       Current medications:    Current Facility-Administered Medications   Medication Dose Route Frequency Provider Last Rate Last Dose   ??? [MAR Hold] clindamycin (CLEOCIN) 600 mg in dextrose 5 % 50 mL IVPB  600 mg Intravenous Q8H Fontaine No, MD   Stopped at 11/26/15 1825   ??? [MAR Hold] 0.9 % sodium chloride infusion   Intravenous Continuous Adine Madura, MD 20 mL/hr at 11/26/15 1755     ??? [MAR Hold] vancomycin (VANCOCIN) intermittent dosing Leisure centre manager)   Other RX Placeholder Fontaine No, MD       ??? [MAR Hold] ondansetron [facility]) injection 4 mg  4 mg Intravenous Q6H PRN  Blane Ohara, PA-C   4 mg at 11/26/15 1915   ??? [MAR Hold] acetaminophen (TYLENOL) tablet 650 mg  650 mg Oral Q4H PRN Blane Ohara, PA-C   650 mg at 11/26/15 1915   ??? [MAR Hold] vancomycin (VANCOCIN) 1,500 mg in dextrose 5 % 500 mL IVPB  1,500 mg Intravenous Q12H Delila Spence, RPH       ??? [MAR Hold] morphine (PF) injection 1 mg  1 mg Intravenous Q2H PRN Blane Ohara, PA-C       ??? [facility] Hold] morphine (PF) injection 2 mg  2 mg Intravenous Q2H PRN Blane Ohara, PA-C           Allergies:    Allergies   Allergen Reactions   ??? Imitrex [Sumatriptan] Anaphylaxis   ??? Iv Dye [Iodides] Anaphylaxis   ??? Amoxicillin-Pot Clavulanate Hives and Itching   ??? Azithromycin Hives and Itching   ??? Bactrim Hives and Itching   ??? Cefuroxime  Axetil Hives and Itching   ??? Ciprofloxacin Hives and Itching   ??? Erythromycin Hives and Itching   ??? Flagyl [Metronidazole] Hives and Itching   ??? Ketorolac Tromethamine Hives and Itching   ??? Klonopin [Clonazepam] Hives and Itching   ??? Levofloxacin Hives and Itching   ??? Nubain [Nalbuphine Hcl]      blister   ??? Other      Stadol-blisters    ??? Pcn [Penicillins] Hives and Itching   ??? Reglan [Metoclopramide]      Blistering    ??? Sulfa Antibiotics Hives and Itching   ??? Tramadol Hives and Itching       Problem List:    Patient Active Problem List   Diagnosis Code   ??? Seizure (HCC) R56.9   ??? Head ache R51   ??? Nausea and vomiting R11.2   ??? Obesity E66.9   ??? Diarrhea R19.7   ??? Cancer of kidney (HCC) C64.9   ??? PTSD (post-traumatic stress disorder) F43.10   ??? Cancer (HCC) C80.1   ??? Chronic kidney disease N18.9   ??? Psychiatric problem F99   ??? Seizures (HCC) R56.9   ??? Lupus (HCC) M32.9   ??? Depression F32.9   ??? Anxiety F41.9   ??? Fibromyalgia M79.7   ??? Post traumatic stress disorder (PTSD) F43.10   ??? S/P repair of ventral hernia Z98.890, Z87.19       Past Medical History:        Diagnosis Date   ??? Anxiety    ??? Asthma    ??? Cancer (HCC)      kidney    ??? Chronic kidney disease    ??? Depression    ??? Diabetes mellitus (HCC)       type 2    ??? Fibromyalgia    ??? HTN (hypertension)    ??? Lupus (HCC)    ??? MRSA (methicillin resistant Staphylococcus aureus)    ??? Post traumatic stress disorder (PTSD)    ??? Psychiatric problem    ??? Seizures (HCC)      Pt voiced she has seizures.       Past Surgical History:        Procedure Laterality Date   ??? Cholecystectomy  2000     [place]    ??? Nose surgery  2007     polyp removed-[facility]    ??? Hysterectomy  1998     [facility]    ??? Colonoscopy  2004     Dr. Pat Patrick    ??? Upper gastrointestinal endoscopy  2004     Dr. Pat Patrick    ??? Abdominal hernia repair  02-07-13     [facility]- Repair Trocar Site Hernia  2. Repair Umbilical Hernia  3. Repair  [facility]       Social History:    Social History   Substance Use Topics   ??? Smoking status: Current Every Day Smoker     Packs/day: 0.10     Years: 6.00     Types: Cigarettes     Last attempt to quit: 02/12/2013   ??? Smokeless tobacco: Never Used   ??? Alcohol use No                                Ready to quit: Not Answered  Counseling given: Not Answered      Vital Signs (Current):   Vitals:    11/26/15 1454 11/26/15 1517   BP: Marland Kitchen)  183/89    Pulse: 89    Resp: 16    Temp: 98.1 ??F (36.7 ??C)    TempSrc: Oral    SpO2: 95%    Weight:  210 lb (95.3 kg)   Height:  5\' 2"  (1.575 m)                                              BP Readings from Last 3 Encounters:   11/26/15 (!) 183/89   11/23/15 (!) 148/70   05/02/14 (!) 134/98       NPO Status:                                                                                 BMI:   Wt Readings from Last 3 Encounters:   11/26/15 210 lb (95.3 kg)   11/23/15 250 lb (113.4 kg)   04/14/14 274 lb (124.3 kg)     Body mass index is 38.41 kg/(m^2).    Anesthesia Evaluation  Patient summary reviewed and Nursing notes reviewed no history of anesthetic complications:   Airway: Mallampati: II  TM distance: >3 FB   Neck ROM: full  Mouth opening: > = 3 FB Dental:          Pulmonary:normal exam  breath sounds clear to auscultation  (+) asthma:     ROS  comment: smoker   Cardiovascular:  Exercise tolerance: good (>4 METS),   (+) hypertension: moderate and no interval change,                Neuro/Psych:   (+) seizures: well controlled, headaches:, psychiatric history:   GI/Hepatic/Renal:   (+) GERD: poorly controlled,         Endo/Other:    (+) Type II DM, , : arthritis:.      Abdominal:   (+) obese,     Abdomen: soft.             Anesthesia Plan    ASA 3     general     intravenous and rapid sequence induction   Anesthetic plan and risks discussed with patient.            Ronalee BeltsJeremy D Pavan Bring, DO   11/26/2015

## 2015-11-26 NOTE — Plan of Care (Signed)
Problem: Pain Control  Goal: Maintain pain level at or below patient???s acceptable level (or 5 if patient is unable to determine acceptable level)  Outcome: Ongoing  Pain controlled with prn morphine. Pain in right hand from dog bite.    Problem: Skin Integrity/Risk  Goal: No skin breakdown during hospitalization  Outcome: Ongoing  Swelling and pain in right hand. To surgery for I&D. Will continue to monitor.  Goal: Wound healing  Outcome: Ongoing  Monitoring wound post surgery.     Problem: Discharge Planning:  Goal: Discharged to appropriate level of care  Discharged to appropriate level of care   Outcome: Ongoing  Home at discharge    Comments:   Care plan reviewed with patient.  Patient verbalize understanding of the plan of care and contribute to goal setting.

## 2015-11-26 NOTE — Procedures (Signed)
EKG was handed to Brady,  RN.

## 2015-11-27 LAB — POCT GLUCOSE
POC Glucose: 113 mg/dl — ABNORMAL HIGH (ref 70–108)
POC Glucose: 132 mg/dl — ABNORMAL HIGH (ref 70–108)
POC Glucose: 139 mg/dl — ABNORMAL HIGH (ref 70–108)

## 2015-11-27 MED ORDER — FENTANYL CITRATE (PF) 100 MCG/2ML IJ SOLN
100 MCG/2ML | INTRAMUSCULAR | Status: DC | PRN
Start: 2015-11-27 — End: 2015-11-26

## 2015-11-27 MED ORDER — ESTROGENS CONJUGATED 0.3 MG PO TABS
0.3 MG | Freq: Every day | ORAL | Status: DC
Start: 2015-11-27 — End: 2015-11-27

## 2015-11-27 MED ORDER — VENLAFAXINE HCL ER 75 MG PO CP24
75 MG | Freq: Every day | ORAL | Status: DC
Start: 2015-11-27 — End: 2015-11-29
  Administered 2015-11-28 – 2015-11-29 (×2): 225 mg via ORAL

## 2015-11-27 MED ORDER — TRAZODONE HCL 100 MG PO TABS
100 MG | Freq: Every evening | ORAL | Status: DC
Start: 2015-11-27 — End: 2015-11-29
  Administered 2015-11-28: 02:00:00 100 mg via ORAL

## 2015-11-27 MED ORDER — LEVETIRACETAM 500 MG PO TABS
500 MG | Freq: Two times a day (BID) | ORAL | Status: DC
Start: 2015-11-27 — End: 2015-11-29
  Administered 2015-11-27 – 2015-11-29 (×4): 500 mg via ORAL

## 2015-11-27 MED ORDER — METFORMIN HCL 500 MG PO TABS
500 MG | Freq: Two times a day (BID) | ORAL | Status: DC
Start: 2015-11-27 — End: 2015-11-29
  Administered 2015-11-27 – 2015-11-29 (×4): 500 mg via ORAL

## 2015-11-27 MED ORDER — ONDANSETRON HCL 4 MG/2ML IJ SOLN
4 MG/2ML | Freq: Once | INTRAMUSCULAR | Status: DC | PRN
Start: 2015-11-27 — End: 2015-11-26

## 2015-11-27 MED ORDER — OXYCODONE-ACETAMINOPHEN 5-325 MG PO TABS
5-325 MG | ORAL | Status: DC | PRN
Start: 2015-11-27 — End: 2015-11-28
  Administered 2015-11-27 – 2015-11-28 (×3): 1 via ORAL

## 2015-11-27 MED ORDER — FENTANYL CITRATE (PF) 100 MCG/2ML IJ SOLN
100 MCG/2ML | INTRAMUSCULAR | Status: AC | PRN
Start: 2015-11-27 — End: 2015-11-26
  Administered 2015-11-27 (×4): 50 ug via INTRAVENOUS

## 2015-11-27 MED ORDER — CLINDAMYCIN HCL 150 MG PO CAPS
150 MG | Freq: Three times a day (TID) | ORAL | Status: DC
Start: 2015-11-27 — End: 2015-11-27

## 2015-11-27 MED ORDER — PHENYTOIN SODIUM EXTENDED 100 MG PO CAPS
100 MG | Freq: Three times a day (TID) | ORAL | Status: DC
Start: 2015-11-27 — End: 2015-11-29
  Administered 2015-11-27 – 2015-11-29 (×5): 100 mg via ORAL

## 2015-11-27 MED ORDER — HYDROMORPHONE HCL 2 MG/ML IJ SOLN
2 MG/ML | INTRAMUSCULAR | Status: AC
Start: 2015-11-27 — End: 2015-11-27

## 2015-11-27 MED ORDER — HYDROMORPHONE HCL 2 MG/ML IJ SOLN
2 MG/ML | INTRAMUSCULAR | Status: DC
Start: 2015-11-27 — End: 2015-11-26

## 2015-11-27 MED ORDER — PANTOPRAZOLE SODIUM 40 MG PO TBEC
40 MG | Freq: Every day | ORAL | Status: DC
Start: 2015-11-27 — End: 2015-11-27

## 2015-11-27 MED ORDER — DEXTROSE 5 % IV SOLN (MINI-BAG)
5 % | Freq: Three times a day (TID) | INTRAVENOUS | Status: DC
Start: 2015-11-27 — End: 2015-11-29
  Administered 2015-11-27 – 2015-11-29 (×6): 1 g via INTRAVENOUS

## 2015-11-27 MED ORDER — DIPHENHYDRAMINE HCL 50 MG/ML IJ SOLN
50 MG/ML | Freq: Four times a day (QID) | INTRAMUSCULAR | Status: DC | PRN
Start: 2015-11-27 — End: 2015-11-27

## 2015-11-27 MED ORDER — PANTOPRAZOLE SODIUM 40 MG PO TBEC
40 MG | Freq: Every day | ORAL | Status: DC
Start: 2015-11-27 — End: 2015-11-29
  Administered 2015-11-27 – 2015-11-29 (×3): 40 mg via ORAL

## 2015-11-27 MED ORDER — ESTROGENS CONJUGATED 0.3 MG PO TABS
0.3 MG | Freq: Every day | ORAL | Status: DC
Start: 2015-11-27 — End: 2015-11-29
  Administered 2015-11-27 – 2015-11-29 (×3): 0.9 mg via ORAL

## 2015-11-27 MED ORDER — LIDOCAINE HCL (PF) 1 % IJ SOLN
1 % | Freq: Once | INTRAMUSCULAR | Status: DC
Start: 2015-11-27 — End: 2015-11-29

## 2015-11-27 MED ORDER — VENLAFAXINE HCL ER 150 MG PO CP24
150 MG | Freq: Every day | ORAL | Status: DC
Start: 2015-11-27 — End: 2015-11-27
  Administered 2015-11-27: 13:00:00 150 mg via ORAL

## 2015-11-27 MED ORDER — HYDROMORPHONE HCL 1 MG/ML IJ SOLN
1 MG/ML | INTRAMUSCULAR | Status: DC | PRN
Start: 2015-11-27 — End: 2015-11-28
  Administered 2015-11-27 – 2015-11-28 (×8): 0.5 mg via INTRAVENOUS

## 2015-11-27 MED ORDER — MEPERIDINE HCL 25 MG/ML IJ SOLN
25 MG/ML | INTRAMUSCULAR | Status: DC | PRN
Start: 2015-11-27 — End: 2015-11-26

## 2015-11-27 MED ORDER — MORPHINE SULFATE (PF) 2 MG/ML IV SOLN
2 MG/ML | INTRAVENOUS | Status: DC | PRN
Start: 2015-11-27 — End: 2015-11-27
  Administered 2015-11-27: 2 mg via INTRAVENOUS

## 2015-11-27 MED ORDER — DIPHENHYDRAMINE HCL 25 MG PO TABS
25 MG | Freq: Four times a day (QID) | ORAL | Status: DC | PRN
Start: 2015-11-27 — End: 2015-11-27
  Administered 2015-11-27: 16:00:00 25 mg via ORAL

## 2015-11-27 MED ORDER — HYDROXYZINE PAMOATE 25 MG PO CAPS
25 MG | Freq: Three times a day (TID) | ORAL | Status: DC | PRN
Start: 2015-11-27 — End: 2015-11-29
  Administered 2015-11-28: 02:00:00 25 mg via ORAL

## 2015-11-27 MED ORDER — NORMAL SALINE FLUSH 0.9 % IV SOLN
0.9 % | INTRAVENOUS | Status: DC | PRN
Start: 2015-11-27 — End: 2015-11-29

## 2015-11-27 MED ORDER — MORPHINE SULFATE (PF) 2 MG/ML IV SOLN
2 MG/ML | INTRAVENOUS | Status: DC | PRN
Start: 2015-11-27 — End: 2015-11-27

## 2015-11-27 MED ORDER — LABETALOL HCL 5 MG/ML IV SOLN
5 MG/ML | INTRAVENOUS | Status: DC | PRN
Start: 2015-11-27 — End: 2015-11-26

## 2015-11-27 MED ORDER — NORMAL SALINE FLUSH 0.9 % IV SOLN
0.9 % | Freq: Two times a day (BID) | INTRAVENOUS | Status: DC
Start: 2015-11-27 — End: 2015-11-29

## 2015-11-27 MED ORDER — FENTANYL CITRATE (PF) 100 MCG/2ML IJ SOLN
100 MCG/2ML | INTRAMUSCULAR | Status: AC
Start: 2015-11-27 — End: 2015-11-27

## 2015-11-27 MED ORDER — PRAMIPEXOLE DIHYDROCHLORIDE 0.25 MG PO TABS
0.25 MG | Freq: Every evening | ORAL | Status: DC
Start: 2015-11-27 — End: 2015-11-29
  Administered 2015-11-28 – 2015-11-29 (×2): 0.25 mg via ORAL

## 2015-11-27 MED ORDER — HYDROXYZINE PAMOATE 50 MG PO CAPS
50 MG | Freq: Three times a day (TID) | ORAL | Status: DC | PRN
Start: 2015-11-27 — End: 2015-11-27

## 2015-11-27 MED ORDER — LORAZEPAM 2 MG/ML IJ SOLN
2 MG/ML | Freq: Once | INTRAMUSCULAR | Status: AC
Start: 2015-11-27 — End: 2015-11-26
  Administered 2015-11-27: 03:00:00 0.5 mg via INTRAVENOUS

## 2015-11-27 MED ORDER — HYDRALAZINE HCL 20 MG/ML IJ SOLN
20 MG/ML | INTRAMUSCULAR | Status: DC | PRN
Start: 2015-11-27 — End: 2015-11-26

## 2015-11-27 MED ORDER — TRAZODONE HCL 100 MG PO TABS
100 MG | Freq: Every evening | ORAL | Status: DC
Start: 2015-11-27 — End: 2015-11-27

## 2015-11-27 MED ORDER — HYDROMORPHONE HCL 2 MG/ML IJ SOLN
2 MG/ML | INTRAMUSCULAR | Status: DC | PRN
Start: 2015-11-27 — End: 2015-11-27
  Administered 2015-11-27 (×4): 0.5 mg via INTRAVENOUS

## 2015-11-27 MED ORDER — DIPHENHYDRAMINE HCL 50 MG/ML IJ SOLN
50 MG/ML | Freq: Four times a day (QID) | INTRAMUSCULAR | Status: DC | PRN
Start: 2015-11-27 — End: 2015-11-29
  Administered 2015-11-28 – 2015-11-29 (×3): 25 mg via INTRAVENOUS

## 2015-11-27 MED FILL — LEVETIRACETAM 500 MG PO TABS: 500 MG | ORAL | Qty: 1

## 2015-11-27 MED FILL — METFORMIN HCL 500 MG PO TABS: 500 MG | ORAL | Qty: 1

## 2015-11-27 MED FILL — ONDANSETRON HCL 4 MG/2ML IJ SOLN: 4 MG/2ML | INTRAMUSCULAR | Qty: 2

## 2015-11-27 MED FILL — VANCOMYCIN HCL 500 MG IV SOLR: 500 MG | INTRAVENOUS | Qty: 500

## 2015-11-27 MED FILL — HYDROXYZINE PAMOATE 50 MG PO CAPS: 50 MG | ORAL | Qty: 1

## 2015-11-27 MED FILL — HYDROMORPHONE HCL 1 MG/ML IJ SOLN: 1 MG/ML | INTRAMUSCULAR | Qty: 1

## 2015-11-27 MED FILL — HYDROXYZINE PAMOATE 25 MG PO CAPS: 25 MG | ORAL | Qty: 1

## 2015-11-27 MED FILL — PREMARIN 0.3 MG PO TABS: 0.3 MG | ORAL | Qty: 3

## 2015-11-27 MED FILL — PANTOPRAZOLE SODIUM 40 MG PO TBEC: 40 MG | ORAL | Qty: 1

## 2015-11-27 MED FILL — PHENYTOIN SODIUM EXTENDED 100 MG PO CAPS: 100 MG | ORAL | Qty: 1

## 2015-11-27 MED FILL — AZTREONAM 1 G IJ SOLR: 1 g | INTRAMUSCULAR | Qty: 1

## 2015-11-27 MED FILL — FENTANYL CITRATE (PF) 100 MCG/2ML IJ SOLN: 100 MCG/2ML | INTRAMUSCULAR | Qty: 2

## 2015-11-27 MED FILL — DEMEROL 25 MG/ML IJ SOLN: 25 MG/ML | INTRAMUSCULAR | Qty: 1

## 2015-11-27 MED FILL — VENLAFAXINE HCL ER 150 MG PO CP24: 150 MG | ORAL | Qty: 1

## 2015-11-27 MED FILL — TRAZODONE HCL 100 MG PO TABS: 100 MG | ORAL | Qty: 2

## 2015-11-27 MED FILL — MORPHINE SULFATE (PF) 2 MG/ML IV SOLN: 2 MG/ML | INTRAVENOUS | Qty: 1

## 2015-11-27 MED FILL — HYDROMORPHONE HCL 2 MG/ML IJ SOLN: 2 MG/ML | INTRAMUSCULAR | Qty: 1

## 2015-11-27 MED FILL — CLINDAMYCIN HCL 150 MG PO CAPS: 150 MG | ORAL | Qty: 1

## 2015-11-27 MED FILL — CLEOCIN IN D5W 600 MG/50ML IV SOLN: 600 MG/50ML | INTRAVENOUS | Qty: 50

## 2015-11-27 MED FILL — PRAMIPEXOLE DIHYDROCHLORIDE 0.25 MG PO TABS: 0.25 MG | ORAL | Qty: 1

## 2015-11-27 MED FILL — OXYCODONE-ACETAMINOPHEN 5-325 MG PO TABS: 5-325 MG | ORAL | Qty: 1

## 2015-11-27 MED FILL — BANOPHEN 25 MG PO TABS: 25 MG | ORAL | Qty: 1

## 2015-11-27 MED FILL — TRAZODONE HCL 100 MG PO TABS: 100 MG | ORAL | Qty: 1

## 2015-11-27 MED FILL — DIPHENHYDRAMINE HCL 50 MG/ML IJ SOLN: 50 MG/ML | INTRAMUSCULAR | Qty: 1

## 2015-11-27 MED FILL — VANCOMYCIN HCL 1000 MG IV SOLR: 1000 MG | INTRAVENOUS | Qty: 1500

## 2015-11-27 NOTE — Progress Notes (Signed)
Patient making multiple requests with medication changes. Patient states "my meds are not right." This nurse contacts Enid SkeensFaye Felkey, PA-C regarding patient's concerns and patient's request for increased pain medications. Enid SkeensFaye Felkey, PA-C states "no increase in pain medicine." Further requests this nurse contact patient's primary pharmacy and request medication list. [facility] [place] and med list requested.

## 2015-11-27 NOTE — Progress Notes (Signed)
Patient returned to 5K from OR. IV is out. Called resource for IV start after 2 unsuccessful attempts. Patient anxious, crying, will not stay still, fidgeting with everything in room, worried about her piercing on her lip and trying to put it back in while crying that she is in tremendous pain. Told patient multiple times to please stay in the bed so I can call for orders to help her. Took an hour to calm patient down and keep her in bed before I could leave the room to call for orders. Contacted PA, morphine changed to dilaudid. Waiting for IV start.

## 2015-11-27 NOTE — Progress Notes (Signed)
INFECTIOUS DISEASES  PROGRESS NOTE    Pt Name: Theresa RankinsJo A Supple  MRN: [id number]  [id number]  Birthdate: 10/31/1965  Admit Date: 11/26/2015  2:40 PM  Date of evaluation: 11/27/2015  [facility] Phys[facility]   [id number]      50 year old female who has  had problems with her hand. The patient had her hand bitten by her own dog on  11/23/2015 as she was grooming it. This bite was on the palmar aspect of the  hand. This was a puncture injury. The patient is a known diabetic with a  history of multiple allergies. taken for surgery 11/26/15 for i&D  Subjective:   Interval History: As above. Notes reviewed. Iv access problems    Active ATBs: vancomycin 3/24                       Clindamycin 3/24                       azactam 3/24    Pt/Chart review:  Fever No, DiarrheaNo, Dyspnea No, Nausea:None      Data:   Scheduled Meds:  ??? [START ON 11/28/2015] pantoprazole  40 mg Oral QAM AC   ??? lidocaine PF  5 mL Intradermal Once   ??? sodium chloride flush  10 mL Intravenous 2 times per day   ??? clindamycin (CLEOCIN) IV  600 mg Intravenous Q8H   ??? vancomycin (VANCOCIN) intermittent dosing Leisure centre manager(placeholder)   Other RX Placeholder   ??? vancomycin  1,500 mg Intravenous Q12H   ??? aztreonam  1 g Intravenous Q8H   ??? levETIRAcetam  500 mg Oral BID   ??? metFORMIN  500 mg Oral BID WC   ??? phenytoin  100 mg Oral TID   ??? traZODone  200 mg Oral Nightly   ??? venlafaxine  150 mg Oral Daily     Continuous Infusions:  ??? sodium chloride 20 mL/hr at 11/26/15 1755       I & O's:  I/O last 3 completed shifts:  In: 1896 [P.O.:480; I.V.:1416]  Out: 10 [Blood:10]       Intake/Output Summary (Last 24 hours) at 11/27/15 1201  Last data filed at 11/27/15 0653   Gross per 24 hour   Intake             1896 ml   Output               10 ml   Net             1886 ml       Date 11/27/15 0000 - 11/27/15 2359   Shift 0000-0759 [id number] 1600-2359 24 Hour Total   I  N  T  A  K  E   P.O.  (mL/kg/hr) 480  (0.6)   480    I.V.  (mL/kg) 716  (7.5)   716  (7.5)     Shift Total  (mL/kg) 1196  (12.6)   1196  (12.6)   O  U  T  P  U  T   Shift Total  (mL/kg)       Weight (kg) 95.3 95.3 95.3 95.3           CBC:   Recent Labs      11/26/15   1659   WBC  10.2   HGB  14.6   PLT  223     BMP:  Recent Labs  11/26/15   1659   NA  142   K  4.1   CL  102   CO2  25   BUN  18   CREATININE  0.8   GLUCOSE  92     Hepatic: No results for input(s): AST, ALT, ALB, BILITOT, ALKPHOS in the last 72 hours.  BNP: No results for input(s): BNP in the last 72 hours.  URINALYSIS: na    MICRO: pending    IMAGING:       Objective:   Vitals:   Visit Vitals   ??? BP 139/67   ??? Pulse 99   ??? Temp 98.7 ??F (37.1 ??C) (Oral)   ??? Resp 18   ??? Ht  (1.575 m)   ??? Wt 210 lb (95.3 kg)   ??? SpO2 92%   ??? BMI 38.41 kg/m2     HEENT: Head: Normocephalic, no lesions, without obvious abnormality.  Lungs: clear to auscultation bilaterally  Heart: S1, S2 normal  Abdomen: soft, non-tender; bowel sounds normal; no masses,  no organomegaly  Extremities: right hand dressing  Neurologic: Mental status: Alert, oriented, thought content appropriate   Wound/Ulcers: Skin Ulcer: post op dressing in place     Assessment:     1. Right hand abscess/tenosynovitis.  2. Right hand cellulitis.  3.  status post dog bite on 11/23/2015, for surgical I and D on  11/26/2015.  4. Multiple antibiotic allergies and cephalosporin causes hives.  5. Diabetes mellitus, type 2.  6. Obesity, class 2.    Patient Active Problem List:     Seizure (HCC)     Head ache     Nausea and vomiting     Obesity     Diarrhea     Cancer of kidney (HCC)     PTSD (post-traumatic stress disorder)     Cancer (HCC)     Chronic kidney disease     Psychiatric problem     Seizures (HCC)     Lupus (HCC)     Depression     Anxiety     Fibromyalgia     Post traumatic stress disorder (PTSD)     S/P repair of ventral hernia      Plan:  Continue current antibiotic with vancomycin,clinda and azactam  Follow cultures  Mid line   D/w orhto      Labs, cultures, and radiographs reviewed.  Changes made as necessary. D/w Patient's R.N. and specific instructions given and infectious disease issues addressed. Will follow the patient closely with you. Also please see the orders for updated patient care orders written by ID team.    Thank you for involving [place] in this patient's care. I will be discussing this case with the treating physicians.Please don't hesitate to call me if any questions, issues  or concerns      Please see orders for updated patient care orders written today.  Electronically signed by Osa Craver on 11/27/2015 at 12:01 PM

## 2015-11-27 NOTE — Progress Notes (Signed)
Dressing to right hand changed, per patient's request. Patient tolerates dressing change well. Patient requesting to take photos of surgical incisions with cell phone. Patient pleasant and cooperative with this nurse at time of dressing change. Discussion held with potential course of hospital treatment. Patient reminded of discussions with physicians prior in shift. Patient requesting PRN pain medications. Patient educated on pain medication orders and requesting to "wait until the Dilaudid can be given." Call light and water pitcher remain within reach.

## 2015-11-27 NOTE — Progress Notes (Signed)
[facility], per patient's request to be administered Prilosec and Premarin now instead of scheduled time in AM. Enid SkeensFaye Felkey, PA-C in agreement to give dose at this time. Patient informed of conversation with Enid SkeensFaye Felkey, PA-C and also informed of conversation with Enid SkeensFaye Felkey, PA-C about "weaning patient off of pain medications." Patient verbalizes understanding and in agreement with plan. Patient states "lets try to wait 4 hours in between doses of Dilaudid, then we can go to 6 hours in between." Patient denies needs at present time. Call light and water pitcher remain within reach.

## 2015-11-27 NOTE — Progress Notes (Signed)
[facility]  OCCUPATIONAL THERAPY MISSED TREATMENT NOTE  ACUTE CARE    Date: 11/27/2015  Patient Name: Theresa Hobbs        MRN: [id number]   Account: [id number]   DOB: 06/02/1966  (50 y.o.)  Gender: female                REASON FOR MISSED TREATMENT:  Hold treatment per nursing request.  Per RN pt is having increased pain at this time and has not had any pain meds through IV d/t just receiving a new IV line. Will complete fabrication of volar splint and initiate ROM on 11/28/15.

## 2015-11-27 NOTE — Progress Notes (Signed)
Ice applied to right hand for 20 minutes per patient request.  Patient tolerated well.

## 2015-11-27 NOTE — Progress Notes (Signed)
Tearful, frustrated. IV infiltrated twice. In quite a bit of pain. Multiple allergies  Right hand dressing changed. No active drainage. No purulence. Tender. NV intact distally  aerobic cultures-no growth.  Talked to Dr Aletha Halimkamepalli, wants PICC line ordered.   IV abx per infectious disease

## 2015-11-27 NOTE — Progress Notes (Signed)
Patient very tearful this AM after shower. Voicing frustrations with removal of IV access and inability to administer IV antibiotics and IV pain medications. Patient noted to be very agitated. 1:1 and calming techniques given but ineffective. Patient offered PRN Tylenol to aid with pain management, but patient refuses. Enid SkeensFaye Felkey, PA-C present on unit and rounding with patient. Dressing changed. Patient demeanor improves t/o remainder of morning. Continuous 1:1 with this nurse given. Patient verbalizes gratitude to this nurse. Will continue to monitor.

## 2015-11-27 NOTE — Progress Notes (Signed)
Received med list from [facility], spoke with Enid SkeensFaye Felkey, PA-C and reviewed medication list. Telephone order to change the following orders: Add Effexor 75mg  1 capsule PO daily take with Effexor 150mg  daily, add Mirapex (pramipexole) 0.25mg  take 1 tablet PO 2-3 hours before bedtime daily, change Trazadone 200mg  PO nightly to Trazadone 100mg  PO nightly, change hydroxyzine 50mg  PO 3x/day PRN to hydroxyzine 25mg  PO 3x/day, add Premarin 0.9mg  tablet take one tab PO daily. Also discussed patient's request to change PO Benadryl to IV Benadryl. Enid SkeensFaye Felkey, PA-C in agreement with change. Enid SkeensFaye Felkey, PA-C also updated regarding patient's request for this nurse to change dressing to right hand. Enid SkeensFaye Felkey, PA-C in agreement to allow nursing staff to change dressing. Instructed by Enid SkeensFaye Felkey, PA-C to apply adaptic and 4x4 gauze to site, wrap with kerlex, apply splint and wrap with ACE wrap. All above orders read [facility] to [facility], PA-C and verbalized agreement.

## 2015-11-27 NOTE — Op Note (Signed)
[facility]                       [address] [place]  [address]                                   OPERATIVE REPORT    PATIENT NAME:  Theresa Hobbs, Theresa A.                    DOB:      01-Apr-1966  MED REC NO:    [id number]                        ROOM:     5K [id number]  ACCOUNT NO:    [id number]                          ADMISSION DATE: 11/26/2015  PHYSICIAN:     Theresa Senter, M.D.                  DATE OF PROCEDURE:  11/27/2015    PREOPERATIVE DIAGNOSES:  Right thenar and mid palmar deep space infections  secondary to dog bite.    POSTOPERATIVE DIAGNOSES:  Right thenar and mid palmar deep space infections  secondary to dog bite.    SURGICAL PROCEDURE:  1.  Incision and drainage, right mid palmar deep space.  2.  Incision and drainage, right thenar space.    SURGEON:  Theresa Senter MD    ANESTHESIA:  General.    COMPLICATIONS:  None.      ESTIMATED BLOOD LOSS:  Less than 30 mL    PROCEDURE:  The patient was seen and examined preoperatively.    FINDINGS:  Per history and physical.    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  She has noted progressive pain, swelling, redness and loss  of motion despite being on oral antibiotics.  SHE HAS MULTIPLE DRUG ALLERGIES.   She is a diabetic.  She did not have a fever.  On exam, she held her MPs  extended and her PIP joints flexed.  She was diffusely tender.  She had some  dorsal swelling over the hand.  She had a puncture wound near the base of the  right thenar eminence that was sealed over with serum.  She was diffusely  tender over her thenar eminence, her mid palm all the way to the base of her  fingers, mildly tender out near digits.  She did have pain in her palm with  any attempts at passive motion of her fingers and she would withdraw and  significantly limit clinical exam.  She was mild tender dorsally,  particularly  over the 1st thumb web space.  Her thumb, she held her thumb in an abducted  posturing.  Her findings were most suspect for being due to a thenar as well  as mid-palmar deep space infections.  She did not seem to have septic flexor  tenosynovitis nor any septic arthritis.  I reviewed the nature of the proposed  diagnoses with the patient and discussed further evaluation and treatment  options, feel it would be prudent to proceed with I and D of her  right thenar  and mid palmar deep spaces through a 2-incision technique.  Ashby Dawesature of the  procedure, anticipated postop course, benefits, alternatives, and risks were  reviewed.  Risks discussed included but not exclusive of failure to diagnose,  incomplete symptom relief, pain, stiffness, infection, nerve or vascular  injury, loss of function, medical  risks, risks of anesthetic, need for  further surgery as well as CRPS.  She seemed to understand.  All questions  were answered and wished to proceed.  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    DESCRIPTION OF PROCEDURE:  She was taken to the operating room, general  anesthesia was administered.  Tourniquet placed on her proximal forearm over  Webril.  Her upper extremity was prepped and draped in the usual sterile  fashion.  A successful timeout was carried out.  Procedure was done _____  loupe magnification.    The extremity was elevated and the tourniquet inflated to 250 mmHg.  A curved  incision was then made in the right palm, starting proximal to the second web  space and then curving down just ulnar to the thenar eminence toward the  hypothenar eminence.  Sharp dissection through the skin, the palmar fascia was  split in line with the incision.  The neurovascular bundles to the digits,  index, long and ring finger, were identified and protected, as was the digital  nerves of the thumb and the motor branch of the thenar eminence.  Dissection  was then carried  down on each side of the flexor tendon to the ring finger  into the deep mid-palmar space.  There was no frank purulence when this was  encountered and opened.  It was opened down to the interosseous muscles.   There was no purulence in the distal aspect of the carpal tunnel.  There was  certainly no fluid or evidence of infection in the flexor sheaths of the  exposed fingers.  The thenar space was opened up through the radial side of  this incision with again care taken to protect the thenar motor branch of the  median nerve, as well as the digital nerves to the thumb and the vascular  structures.  This area was thoroughly opened and again, no frank purulence.   All these areas were thoroughly irrigated with saline.  A longitudinal  incision was then made over the dorsal of the thumb web space proximal to the  skin fold.  Dissection carried down between just radial to the first dorsal  interosseous down to the thenar musculature.  This area was irrigated as well,  no frank purulence.  After thorough irrigation, dorsal incision was left open.   The palmar incision was just loosely approximated with 2 interrupted Prolene  sutures to allow drainage.  Sterile dressing was applied followed by a volar  fiberglass splint with the _____ held in the wrist in dorsal flexion allowing  the MPs to flex.    The patient tolerated the procedure well and left the operating room in good  condition.  There were no family members or significant others available to  review her case.        Theresa Hobbs, M.D.      D: 11/27/2015 13:06:05  T: 11/27/2015 16:35:15  MM/nts  Job#: [id number]  Doc#: [id number]    cc:  Theresa Hobbs, M.D.

## 2015-11-28 LAB — CULTURE, AEROBIC: Aerobic Culture: NO GROWTH

## 2015-11-28 LAB — POCT GLUCOSE
POC Glucose: 127 mg/dl — ABNORMAL HIGH (ref 70–108)
POC Glucose: 118 mg/dl — ABNORMAL HIGH (ref 70–108)
POC Glucose: 117 mg/dl — ABNORMAL HIGH (ref 70–108)

## 2015-11-28 MED ORDER — OXYCODONE-ACETAMINOPHEN 5-325 MG PO TABS
5-325 MG | ORAL | Status: DC | PRN
Start: 2015-11-28 — End: 2015-11-29

## 2015-11-28 MED ORDER — OXYCODONE-ACETAMINOPHEN 5-325 MG PO TABS
5-325 MG | ORAL | Status: DC | PRN
Start: 2015-11-28 — End: 2015-11-28

## 2015-11-28 MED ORDER — OXYCODONE-ACETAMINOPHEN 5-325 MG PO TABS
5-325 MG | ORAL | Status: DC | PRN
Start: 2015-11-28 — End: 2015-11-29
  Administered 2015-11-28 – 2015-11-29 (×5): 2 via ORAL

## 2015-11-28 MED FILL — HYDROMORPHONE HCL 1 MG/ML IJ SOLN: 1 MG/ML | INTRAMUSCULAR | Qty: 1

## 2015-11-28 MED FILL — ONDANSETRON HCL 4 MG/2ML IJ SOLN: 4 MG/2ML | INTRAMUSCULAR | Qty: 2

## 2015-11-28 MED FILL — PANTOPRAZOLE SODIUM 40 MG PO TBEC: 40 MG | ORAL | Qty: 1

## 2015-11-28 MED FILL — PRAMIPEXOLE DIHYDROCHLORIDE 0.25 MG PO TABS: 0.25 MG | ORAL | Qty: 1

## 2015-11-28 MED FILL — PHENYTOIN SODIUM EXTENDED 100 MG PO CAPS: 100 MG | ORAL | Qty: 1

## 2015-11-28 MED FILL — VENLAFAXINE HCL ER 75 MG PO CP24: 75 MG | ORAL | Qty: 1

## 2015-11-28 MED FILL — LEVETIRACETAM 500 MG PO TABS: 500 MG | ORAL | Qty: 1

## 2015-11-28 MED FILL — HYDROXYZINE PAMOATE 25 MG PO CAPS: 25 MG | ORAL | Qty: 1

## 2015-11-28 MED FILL — TRAZODONE HCL 100 MG PO TABS: 100 MG | ORAL | Qty: 1

## 2015-11-28 MED FILL — METFORMIN HCL 500 MG PO TABS: 500 MG | ORAL | Qty: 1

## 2015-11-28 MED FILL — AZTREONAM 1 G IJ SOLR: 1 g | INTRAMUSCULAR | Qty: 1

## 2015-11-28 MED FILL — CLEOCIN IN D5W 600 MG/50ML IV SOLN: 600 MG/50ML | INTRAVENOUS | Qty: 50

## 2015-11-28 MED FILL — OXYCODONE-ACETAMINOPHEN 5-325 MG PO TABS: 5-325 MG | ORAL | Qty: 1

## 2015-11-28 MED FILL — PREMARIN 0.3 MG PO TABS: 0.3 MG | ORAL | Qty: 3

## 2015-11-28 MED FILL — DIPHENHYDRAMINE HCL 50 MG/ML IJ SOLN: 50 MG/ML | INTRAMUSCULAR | Qty: 1

## 2015-11-28 MED FILL — OXYCODONE-ACETAMINOPHEN 5-325 MG PO TABS: 5-325 MG | ORAL | Qty: 2

## 2015-11-28 MED FILL — VANCOMYCIN HCL 500 MG IV SOLR: 500 MG | INTRAVENOUS | Qty: 500

## 2015-11-28 NOTE — Progress Notes (Signed)
Patient still refusing to allow for assessment and medications to be given at this time.

## 2015-11-28 NOTE — Plan of Care (Signed)
Problem: Pain Control  Goal: Maintain pain level at or below patient???s acceptable level (or 5 if patient is unable to determine acceptable level)  Outcome: Ongoing  Pain medication given per physician orders.  Patient notes pain unchanged this shift but patient able to sleep between doses.    Problem: Skin Integrity/Risk  Goal: No skin breakdown during hospitalization  Outcome: Ongoing  No new skin issues noted this shift.  Dressing intact right hand.  Goal: Wound healing  Outcome: Ongoing    Problem: Discharge Planning:  Goal: Discharged to appropriate level of care  Discharged to appropriate level of care   Outcome: Ongoing  Discharge planning ongoing.  Patient plans for discharge to home with PICC in place.    Comments:   Care plan reviewed with patient.  Patient verbalized understanding of the plan of care and contribute to goal setting.

## 2015-11-28 NOTE — Progress Notes (Signed)
Patient would allow for vs to be taken to get pain medications but refusing for assessment or other medications at this time. Will try again later.

## 2015-11-28 NOTE — Progress Notes (Signed)
Patient is refusing for VS to be taken at this time. Patient refusing assessment as well. Patient stated to nauseous. Given zofran. Will try again in a bit.

## 2015-11-28 NOTE — Progress Notes (Signed)
Patient refused PICC line. IV team RN tried two different times to place PICC line- patient refused.

## 2015-11-28 NOTE — Progress Notes (Signed)
[facility]  INPATIENT OCCUPATIONAL THERAPY  STR ONC MED 5K  EVALUATION    Time:  Time In: 1010  Time Out: 1020  Timed Code Treatment Minutes: 0 Minutes  Minutes: 10          Date: 11/28/2015  Patient Name: Theresa Hobbs,   Gender: female      MRN: [id number],  Acct: [id number]    DOB: 1966-03-17  (50 y.o.)  Referring Practitioner: Marnee Spring PA-C  Diagnosis: right hand dog bite  Additional Pertinent Hx: Pt admitted to [facility] on 11/26/15 with increased swelling in right hand. Pt bitten by a dog while grooming on 11/23/15. Pt underwent a I&D of right hand on 11/26/15. Received OT order for volar splint ot right hand and initiate ROM to digits.     Restrictions/Precautions:  Restrictions/Precautions: General Precautions            Position Activity Restriction  Other position/activity restrictions: volar based splint with dressing and ace wrap to right hand         Past Medical History   Diagnosis Date   . Anxiety    . Asthma    . Cancer (HCC)      kidney    . Chronic kidney disease    . Depression    . Diabetes mellitus (HCC)      type 2    . Fibromyalgia    . HTN (hypertension)    . Lupus (HCC)    . MRSA (methicillin resistant Staphylococcus aureus)    . Post traumatic stress disorder (PTSD)    . Psychiatric problem    . Seizures (HCC)      Pt voiced she has seizures.     Past Surgical History   Procedure Laterality Date   . Cholecystectomy  2000     [place]    . Nose surgery  2007     polyp removed-[facility]    . Hysterectomy  1998     [facility]    . Colonoscopy  2004     Dr. Pat Patrick    . Upper gastrointestinal endoscopy  2004     Dr. Pat Patrick    . Abdominal hernia repair  02-07-13     [facility]- Repair Trocar Site Hernia  2. Repair Umbilical Hernia  3. Repair  [facility]   . Other surgical history Right 11/26/2015     Right Hand Invcision and Drainage by Dr Dellie Catholic           Subjective  Chart Reviewed: Yes  Patient assessed for rehabilitation services?: Yes    Subjective: Pt sitting EOB upon entering room this date. Pt  becoming very restless and agitated when she couldn't find her phone with RN assisting in locating it. Pt continued to state over and over "I dont feel good." RN present in room during attempts to educate and initiate ROM.    General:       Vision: Within Functional Limits    Hearing: Within functional limits         Pain:  Pain Assessment  Patient Currently in Pain: Yes  Pain Assessment: 0-10  Pain Type: Acute pain  Pain Location: Hand  Pain Orientation: Right  Pain Descriptors: Sharp  Pain Frequency: Continuous       Social/Functional:                                   Additional Comments:  Unsure of pts PLOF d/t pt restless and agitated not answering questions. 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     Objective        Cognition Comment: Pt very anxious, restless and agitated during therapist time in room. Pt continued to state over and over "I dont' feel good."          Sensation  Overall Sensation Status: WNL    Edema: Pt with increased edema in right digits. Attempted to educate pt on use of ice to help decrease swelling with pt stating "What's the point if I can't feel it through the splint." Pt educated on placing ice on dorsal surface of hand/forearm to assist with the swelling with pt continuing to state "Whats the point."    Observation/Palpation  Edema: Pt with increased edema in right digits. Attempted to educate pt on use of ice to help decrease swelling with pt stating "What's the point if I can't feel it through the splint." Pt educated on placing ice on dorsal surface of hand/forearm to assist with the swelling with pt continuing to state "Whats the point."                    Left Hand AROM (degrees)  Left Hand AROM: WNL          Right Hand AROM (degrees)  Right Hand General AROM: Pts right digits swollen this date resting in a slighlty flexed position. Pt demo slight movement in flex and extension but unable to complete full ROM. therapist attempted to assist with  extension with pt yelling out in pain and pulling hand away. Pt not allowing for further ROM.                                       Splinting  Comment: Order received for right volar splint. Pt presenting with a hard splint on volar surface of forearm into palm of hand wrapped with a ace wrap at the time of OT eval. RN reporting PA-C ok with what pt is currently wearing. Will not be fabricating a new splint at this time.     RUE Tone: Normotonic  LUE Tone: Normotonic                    ADL  Additional Comments: unable to assess at this time     Bed mobility  Sit to Supine: Stand by assistance    Transfers  Sit to stand: Stand by assistance  Stand to sit: Stand by assistance    Balance  Sitting Balance: Modified independent   Standing Balance: Stand by assistance           Functional Mobility  Functional - Mobility Device: No device  Assist Level: Stand by assistance  Functional Mobility Comments: in room with education on safety for IV line d/t pt getting it caught on bedding                Activity Tolerance:  Activity Tolerance: Patient limited by pain  Activity Tolerance: limited by increased anxiety, restlessness and agitation. Pt pulling away from therapist during attmepts at ROM.       Assessment:  Assessment: Pt admitted to hospital after a dog bite having a I& D compelted. Pt with increased pain and swelling in right hand this date not allowing for therapist  to assist with initiating ROM as stated per therapy order. OT Services required to increased active movement and complete education on HEP for home to increase active movement of right hand  upon discharge.   Performance deficits / Impairments: Decreased ADL status, Decreased ROM  Prognosis: Good  Discharge Recommendations: Outpatient OT    Clinical Decision Making: Clinical Decision making was of Low Complexity as the result of analysis of data from a problem focused assessment, a consideration of a limited number of treatment options, no significant  comorbidities affecting the plan of care and no modification or assistance required to complete the evaluation.    Patient Education:  Patient Education: OT POC, positioning, use of ice,  Barriers to Learning: pain, self     Equipment Recommendations:  Equipment Needed: No    Safety:  Safety Devices in place: Yes  Type of devices: Call light within reach, Left in bed, Nurse notified    Plan:  Times per week: 3-5x   Current Treatment Recommendations: ROM    Goals:  Patient goals : go home     Short term goals  Time Frame for Short term goals: 1 week  Short term goal 1: Pt to be educated on right hand/UE HEP including ROM ex and positioning for decreased swelling with pt demo follow through with no vcs.   Long term goals  Time Frame for Long term goals : not est d/tELOS     Evaluation Complexity: Based on the findings of patient history, examination, clinical presentation, and decision making during this evaluation, this patient is of low complexity.

## 2015-11-28 NOTE — Progress Notes (Signed)
INFECTIOUS DISEASES  PROGRESS NOTE    Pt Name: Theresa Hobbs  MRN: [id number]  [id number]  Birthdate: 12/09/1965  Admit Date: 11/26/2015  2:40 PM  Date of evaluation: 11/28/2015  [facility]   5K-23/58023-A      50 year old female who has  had problems with her hand. The patient had her hand bitten by her own dog on  11/23/2015 as she was grooming it. This bite was on the palmar aspect of the  hand. This was a puncture injury. The patient is a known diabetic with a  history of multiple allergies. taken for surgery 11/26/15 for i&D    Subjective:   Interval History: As above. Notes reviewed. Iv access problems. Patient educated to agree with plan for picc as oral options for her are not there    Active ATBs: vancomycin 3/24                       Clindamycin 3/24                       azactam 3/24    Pt/Chart review:  Fever No, DiarrheaNo, Dyspnea No, Nausea:None      Data:   Scheduled Meds:  ??? lidocaine PF  5 mL Intradermal Once   ??? sodium chloride flush  10 mL Intravenous 2 times per day   ??? venlafaxine  225 mg Oral Daily   ??? pramipexole  0.25 mg Oral Nightly   ??? traZODone  100 mg Oral Nightly   ??? estrogens (conjugated)  0.9 mg Oral Daily   ??? pantoprazole  40 mg Oral QAM AC   ??? clindamycin (CLEOCIN) IV  600 mg Intravenous Q8H   ??? vancomycin (VANCOCIN) intermittent dosing Leisure centre manager(placeholder)   Other RX Placeholder   ??? vancomycin  1,500 mg Intravenous Q12H   ??? aztreonam  1 g Intravenous Q8H   ??? levETIRAcetam  500 mg Oral BID   ??? metFORMIN  500 mg Oral BID WC   ??? phenytoin  100 mg Oral TID     Continuous Infusions:  ??? sodium chloride 20 mL/hr at 11/26/15 1755       I & O's:  I/O last 3 completed shifts:  In: 2965 [P.O.:960; I.V.:2005]  Out: -        Intake/Output Summary (Last 24 hours) at 11/28/15 1838  Last data filed at 11/28/15 1404   Gross per 24 hour   Intake             2965 ml   Output                0 ml   Net             2965 ml       Date 11/28/15 0000 - 11/28/15 2359   Shift  [id number] 1600-2359 24 Hour Total   I  N  T  A  K  E   P.O.  (mL/kg/hr) 240  (0.3) 0  (0)  240    I.V.  (mL/kg) 515  (5.4) 539  (5.7)  1054  (11.1)    Shift Total  (mL/kg) 755  (7.9) 539  (5.7)  1294  (13.6)   O  U  T  P  U  T   Shift Total  (mL/kg)       Weight (kg) 95.3 95.3 95.3 95.3  CBC:   Recent Labs      11/26/15   1659   WBC  10.2   HGB  14.6   PLT  223     BMP:    Recent Labs      11/26/15   1659   NA  142   K  4.1   CL  102   CO2  25   BUN  18   CREATININE  0.8   GLUCOSE  92     Hepatic: No results for input(s): AST, ALT, ALB, BILITOT, ALKPHOS in the last 72 hours.  BNP: No results for input(s): BNP in the last 72 hours.  URINALYSIS: na    MICRO:   Gram Stain Result    Aerobic culture         ?? Collected: 11/26/15 1555   ?? Resulting lab: [facility] STR LAB   ?? Value: No segmented neutrophils observed.   No epithelial cells observed.   No organisms observed.      ?? Comment:    ?? *Additional information available - narrative   ??   ??   11/28/2015 11:51 AM - Edi, Lab [facility]   ??   Component Results    ?? Component   ?? Aerobic Culture   ?? No growth-preliminary   No growth      ?? Gram Stain Result   ?? No segmented neutrophils observed.   No epithelial cells observed.   No organisms observed.      ??         IMAGING:       Objective:   Vitals:   Visit Vitals   ??? BP 139/60   ??? Pulse 84   ??? Temp 98.1 ??F (36.7 ??C) (Oral)   ??? Resp 18   ??? Ht  (1.575 m)   ??? Wt 210 lb (95.3 kg)   ??? SpO2 92%   ??? BMI 38.41 kg/m2     HEENT: Head: Normocephalic, no lesions, without obvious abnormality.  Lungs: clear to auscultation bilaterally  Heart: S1, S2 normal  Abdomen: soft, non-tender; bowel sounds normal; no masses,  no organomegaly  Extremities: right hand dressing  Neurologic: Mental status: Alert, oriented, thought content appropriate   Wound/Ulcers: Skin Ulcer: post op dressing in place     Assessment:     1. Right hand abscess/tenosynovitis.  2. Right hand cellulitis.  3.  status post dog bite on  11/23/2015, for surgical I and D on  11/26/2015.  4. Multiple antibiotic allergies and cephalosporin causes hives.  5. Diabetes mellitus, type 2.  6. Obesity, class 2.    Patient Active Problem List:     Seizure (HCC)     Head ache     Nausea and vomiting     Obesity     Diarrhea     Cancer of kidney (HCC)     PTSD (post-traumatic stress disorder)     Cancer (HCC)     Chronic kidney disease     Psychiatric problem     Seizures (HCC)     Lupus (HCC)     Depression     Anxiety     Fibromyalgia     Post traumatic stress disorder (PTSD)     S/P repair of ventral hernia      Plan:  Continue current antibiotic with vancomycin,clinda and azactam--- plan invanz on 11/29/15  Follow cultures  Mid line - discussed with the patient  Labs, cultures, and radiographs reviewed. Changes made as necessary. D/w Patient's R.N. and specific instructions given and infectious disease issues addressed. Will follow the patient closely with you. Also please see the orders for updated patient care orders written by ID team.    Thank you for involving [place] in this patient's care. I will be discussing this case with the treating physicians.Please don't hesitate to call me if any questions, issues  or concerns      Please see orders for updated patient care orders written today.  Electronically signed by Osa Craver on 11/28/2015 at 6:38 PM

## 2015-11-28 NOTE — Progress Notes (Signed)
Still requiring IV medication, but willing to try PO  Right hand bandage is clean and intact. Digits swollen but pink, with good capillary refill and sensate.  Cultures- still no growth  Will discontinue IV pain meds. Order PO.  Trying to get discharged today. Will see if Dr Aletha HalimKamepalli for sure wants PICC line placement considering still no growth on pre-operative culture. No purulence seen intra-operatively.

## 2015-11-29 LAB — POCT GLUCOSE
POC Glucose: 144 mg/dl — ABNORMAL HIGH (ref 70–108)
POC Glucose: 173 mg/dl — ABNORMAL HIGH (ref 70–108)

## 2015-11-29 MED ORDER — ERTAPENEM SODIUM 1 G IJ SOLR
1 GM | INTRAMUSCULAR | Status: DC
Start: 2015-11-29 — End: 2015-11-29
  Administered 2015-11-29: 17:00:00 1000 g via INTRAVENOUS

## 2015-11-29 MED ORDER — ERTAPENEM (INVANZ) INFUSION (OUTPATIENT ONLY)
0 refills | Status: AC
Start: 2015-11-29 — End: 2015-12-13

## 2015-11-29 MED ORDER — PRAMIPEXOLE DIHYDROCHLORIDE 0.25 MG PO TABS
0.25 MG | ORAL_TABLET | Freq: Every evening | ORAL | 3 refills | Status: AC
Start: 2015-11-29 — End: ?

## 2015-11-29 MED ORDER — ESTROGENS CONJUGATED 0.9 MG PO TABS
0.9 MG | ORAL_TABLET | Freq: Every day | ORAL | 3 refills | Status: DC
Start: 2015-11-29 — End: 2017-02-26

## 2015-11-29 MED FILL — ONDANSETRON HCL 4 MG/2ML IJ SOLN: 4 MG/2ML | INTRAMUSCULAR | Qty: 2

## 2015-11-29 MED FILL — HYDROXYZINE PAMOATE 25 MG PO CAPS: 25 MG | ORAL | Qty: 1

## 2015-11-29 MED FILL — AZTREONAM 1 G IJ SOLR: 1 g | INTRAMUSCULAR | Qty: 1

## 2015-11-29 MED FILL — OXYCODONE-ACETAMINOPHEN 5-325 MG PO TABS: 5-325 MG | ORAL | Qty: 2

## 2015-11-29 MED FILL — LEVETIRACETAM 500 MG PO TABS: 500 MG | ORAL | Qty: 1

## 2015-11-29 MED FILL — OXYCODONE-ACETAMINOPHEN 5-325 MG PO TABS: 5-325 MG | ORAL | Qty: 1

## 2015-11-29 MED FILL — PANTOPRAZOLE SODIUM 40 MG PO TBEC: 40 MG | ORAL | Qty: 1

## 2015-11-29 MED FILL — CLEOCIN IN D5W 600 MG/50ML IV SOLN: 600 MG/50ML | INTRAVENOUS | Qty: 50

## 2015-11-29 MED FILL — VENLAFAXINE HCL ER 75 MG PO CP24: 75 MG | ORAL | Qty: 1

## 2015-11-29 MED FILL — DIPHENHYDRAMINE HCL 50 MG/ML IJ SOLN: 50 MG/ML | INTRAMUSCULAR | Qty: 1

## 2015-11-29 MED FILL — PHENYTOIN SODIUM EXTENDED 100 MG PO CAPS: 100 MG | ORAL | Qty: 1

## 2015-11-29 MED FILL — PREMARIN 0.3 MG PO TABS: 0.3 MG | ORAL | Qty: 3

## 2015-11-29 MED FILL — PRAMIPEXOLE DIHYDROCHLORIDE 0.25 MG PO TABS: 0.25 MG | ORAL | Qty: 1

## 2015-11-29 MED FILL — TRAZODONE HCL 100 MG PO TABS: 100 MG | ORAL | Qty: 1

## 2015-11-29 MED FILL — INVANZ 1 G IJ SOLR: 1 g | INTRAMUSCULAR | Qty: 1000

## 2015-11-29 MED FILL — METFORMIN HCL 500 MG PO TABS: 500 MG | ORAL | Qty: 1

## 2015-11-29 MED FILL — VANCOMYCIN HCL 5000 MG IV SOLR: 5000 MG | INTRAVENOUS | Qty: 1500

## 2015-11-29 NOTE — Progress Notes (Signed)
Patient offered warm blanket for pain in left lower arm but patient refused care offer.

## 2015-11-29 NOTE — Progress Notes (Signed)
[facility]  INPATIENT OCCUPATIONAL THERAPY  STR ONC MED 5K  DAILY NOTE    Time:  Time In: 1408  Time Out: 1446  Timed Code Treatment Minutes: 38 Minutes  Minutes: 38          Date: 11/29/2015  Patient Name: Theresa Hobbs,   Gender: female      Room: 5K-23/023-A  MRN: [id number],  Acct: [id number]    DOB: 1965/12/09  (50 y.o.)  Referring Practitioner: Marnee Spring PA-C  Diagnosis: right hand dog bite  Additional Pertinent Hx: Pt admitted to [facility] on 11/26/15 with increased swelling in right hand. Pt bitten by a dog while grooming on 11/23/15. Pt underwent a I&D of right hand on 11/26/15. Received OT order for volar splint ot right hand and initiate ROM to digits.     Restrictions/Precautions:  Restrictions/Precautions: General Precautions    Position Activity Restriction  Other position/activity restrictions: volar based splint with dressing and ace wrap to right hand      Past Medical History   Diagnosis Date   ??? Anxiety    ??? Asthma    ??? Cancer (HCC)      kidney    ??? Chronic kidney disease    ??? Depression    ??? Diabetes mellitus (HCC)      type 2    ??? Fibromyalgia    ??? HTN (hypertension)    ??? Lupus (HCC)    ??? MRSA (methicillin resistant Staphylococcus aureus)    ??? Post traumatic stress disorder (PTSD)    ??? Psychiatric problem    ??? Seizures (HCC)      Pt voiced she has seizures.     Past Surgical History   Procedure Laterality Date   ??? Cholecystectomy  2000     [place]    ??? Nose surgery  2007     polyp removed-[facility]    ??? Hysterectomy  1998     [facility]    ??? Colonoscopy  2004     Dr. Pat Patrick    ??? Upper gastrointestinal endoscopy  2004     Dr. Pat Patrick    ??? Abdominal hernia repair  02-07-13     [facility]- Repair Trocar Site Hernia  2. Repair Umbilical Hernia  3. Repair  [facility]   ??? Other surgical history Right 11/26/2015     Right Hand Invcision and Drainage by Dr Dellie Catholic           Subjective  Subjective: RN okayed OT session. Upon arrival patient was lying in bed. Patient was requesting to shower. RN okayed showering  this date.     Pain:  Pain Assessment  Patient Currently in Pain: Yes  Pain Assessment: 0-10  Pain Level: 6  Pain Type: Acute pain  Pain Location: Hand  Pain Orientation: Right       Objective  Overall Cognitive Status: WFL    ADL  Grooming: Stand by assistance (to comb, and blow dry hair while standing by sink. )  UE Bathing: Supervision  LE Bathing: Supervision  UE Dressing: Minimal assistance (to fasten bra, and don tank top. )  LE Dressing: Contact guard assistance (to don underwear. )  Toileting: Supervision     Bed mobility  Supine to Sit: Stand by assistance    Transfers  Sit to stand: Stand by assistance  Stand to sit: Stand by Armed forces operational officer - Technique: Ambulating  Equipment Used: Standard toilet  Toilet Transfer: Stand by assistance  Balance  Sitting Balance: Modified independent   Standing Balance: Stand by assistance     Time: 5 minutes   Activity: self cares      Functional Mobility  Functional - Mobility Device: No device  Activity: To/from bathroom  Assist Level: Stand by assistance      Type of ROM/Therapeutic Exercise: AROM  Comment: Patient completed RUE AROM as tolerated x 10 reps slow pace. Pt educated to complete exercises 2-3 times daily.     Activity Tolerance:  Activity Tolerance: Patient limited by fatigue;Patient limited by pain    Assessment:   Performance deficits / Impairments: Decreased ADL status, Decreased ROM  Prognosis: Good  Discharge Recommendations: Outpatient OT    Patient Education:  Patient Education: OT POC, positioning, exercises, dressing technique.   Barriers to Learning: None    Equipment Recommendations:  Equipment Needed: No    Safety:  Safety Devices in place: Yes  Type of devices: All fall risk precautions in place, Call light within reach (Left in BR, RN aware. )    Plan:  Times per week: 3-5x   Current Treatment Recommendations: ROM    Goals:  Patient goals : go home     Short term goals  Time Frame for Short term goals: 1 week  Short term  goal 1: Pt to be educated on right hand/UE HEP including ROM ex and positioning for decreased swelling with pt demo follow through with no vcs.   Long term goals  Time Frame for Long term goals : not est d/tELOS

## 2015-11-29 NOTE — Progress Notes (Signed)
Pt given and discussed with discharge instructions.  Patient has no further questions at this time.  Pt aware of follow up appointments.  Pt aware of IV infusions daily.  Pt aware to follow up with OT if they do not call her she said she would call them.  Pt aware of following up with dressing changes at her outpatient IV infusion appointments.  Percocet prescription filled here at [facility] for patient.  Tim pharmacist discussed discharge medications with patient.  Prescriptions for labs, OT, dressing changes, given to patient.  Belongings gathered.  Patient leaving for home by self.

## 2015-11-29 NOTE — Care Coordination-Inpatient (Signed)
Discharge plan discussed by Case Manager and Child psychotherapistocial Worker.  Discharge plan reviewed with patient/ family.  Patient/ family verbalize understanding of discharge plan and are in agreement with plan.  Understanding was demonstrated using the teach back method.           Patient is a workers Education officer, environmentalcomp claim,  Academic librarianClaim number [id number], She was set up with outpatient nursing for 14day Invanz and daily wound care, as well as OT 2-3 times a week

## 2015-11-29 NOTE — Progress Notes (Signed)
INFECTIOUS DISEASES  PROGRESS NOTE    Pt Name: Theresa RankinsJo A Eastwood  MRN: [id number]  [id number]  Birthdate: 10/18/1965  Admit Date: 11/26/2015  2:40 PM  Date of evaluation: 11/29/2015  [facility] Phys[facility]   [id number]-[id number]/65023-A      50 year old female who has  had problems with her hand. The patient had her hand bitten by her own dog on  11/23/2015 as she was grooming it. This bite was on the palmar aspect of the  hand. This was a puncture injury. The patient is a known diabetic with a history of multiple allergies. taken for surgery 11/26/15 for i&D    Subjective:   Interval History: Patient awake and plan for discharge today.    Active ATBs: vancomycin 3/24-3/27 invanz 3/27                       Clindamycin 3/24-3/27                       azactam 3/243/27    Pt/Chart review:  Fever No, Diarrhea No, Dyspnea No, Nausea:None      Data:   Scheduled Meds:  ??? lidocaine PF  5 mL Intradermal Once   ??? sodium chloride flush  10 mL Intravenous 2 times per day   ??? venlafaxine  225 mg Oral Daily   ??? pramipexole  0.25 mg Oral Nightly   ??? traZODone  100 mg Oral Nightly   ??? estrogens (conjugated)  0.9 mg Oral Daily   ??? pantoprazole  40 mg Oral QAM AC   ??? clindamycin (CLEOCIN) IV  600 mg Intravenous Q8H   ??? vancomycin (VANCOCIN) intermittent dosing Leisure centre manager(placeholder)   Other RX Placeholder   ??? vancomycin  1,500 mg Intravenous Q12H   ??? aztreonam  1 g Intravenous Q8H   ??? levETIRAcetam  500 mg Oral BID   ??? metFORMIN  500 mg Oral BID WC   ??? phenytoin  100 mg Oral TID     Continuous Infusions:  ??? sodium chloride 20 mL/hr at 11/26/15 1755       I & O's:  I/O last 3 completed shifts:  In: 539 [I.V.:539]  Out: -        Intake/Output Summary (Last 24 hours) at 11/29/15 1027  Last data filed at 11/28/15 1404   Gross per 24 hour   Intake              539 ml   Output                0 ml   Net              539 ml              CBC:   Recent Labs      11/26/15   1659   WBC  10.2   HGB  14.6   PLT  223     BMP:    Recent Labs       11/26/15   1659   NA  142   K  4.1   CL  102   CO2  25   BUN  18   CREATININE  0.8   GLUCOSE  92     Hepatic: No results for input(s): AST, ALT, ALB, BILITOT, ALKPHOS in the last 72 hours.  BNP: No results for input(s): BNP in the last 72 hours.  URINALYSIS: na  MICRO:   Gram Stain Result    Aerobic culture         ?? Collected: 11/26/15 1555   ?? Resulting lab: [facility] STR [facility]   ?? Value: No segmented neutrophils observed.   No epithelial cells observed.   No organisms observed.      ?? Comment:    ?? *Additional information available - narrative   ??   ??   11/28/2015 11:51 AM - Edi, Lab [employer]   ??   Component Results    ?? Component   ?? Aerobic Culture   ?? No growth-preliminary   No growth      ?? Gram Stain Result   ?? No segmented neutrophils observed.   No epithelial cells observed.   No organisms observed.      ??         IMAGING:       Objective:   Vitals:   Visit Vitals   ??? BP 136/67   ??? Pulse 80   ??? Temp 97.5 ??F (36.4 ??C) (Oral)   ??? Resp 18   ??? Ht  (1.575 m)   ??? Wt 210 lb (95.3 kg)   ??? SpO2 95%   ??? BMI 38.41 kg/m2     HEENT: Head: Normocephalic, no lesions, without obvious abnormality.  Lungs: clear to auscultation bilaterally  Heart: S1, S2 normal  Abdomen: soft, non-tender; bowel sounds normal; no masses,  no organomegaly  Extremities: right hand dressing  Neurologic: Mental status: Alert, oriented, thought content appropriate   Wound/Ulcers: Skin Ulcer: post op dressing in place--pictures reviewed     Assessment:     1. Right hand abscess/tenosynovitis.  2. Right hand cellulitis.  3.  status post dog bite on 11/23/2015, for surgical I and D on  11/26/2015.  4. Multiple antibiotic allergies and cephalosporin causes hives.  5. Diabetes mellitus, type 2.  6. Obesity, class 2.    Patient Active Problem List:     Seizure (HCC)     Head ache     Nausea and vomiting     Obesity     Diarrhea     Cancer of kidney (HCC)     PTSD (post-traumatic stress disorder)     Cancer (HCC)     Chronic kidney disease      Psychiatric problem     Seizures (HCC)     Lupus (HCC)     Depression     Anxiety     Fibromyalgia     Post traumatic stress disorder (PTSD)     S/P repair of ventral hernia      Plan:  Change antibiotic with vancomycin,clinda and azactam---to invanz for 2 weeks   Labs while on antibiotics   Follow up office in 1 week    Noted scribed in real time of face to face encounter by Dr Aletha Halim, transcribed in [facility] after rounds were completed by Jodean Lima, CNP.      Electronically signed by Lilia Argue, CNP on 11/29/2015 at 10:27 AM    I have reviewed and agree with the above note.  The information is true and accurate.   Electronically signed by Fontaine No, MD on 11/29/15 at 11:47 AM

## 2015-11-29 NOTE — Care Coordination-Inpatient (Signed)
Patient has a BWC claim number of [id number] of injury 11/23/2015, spoke with [employer] manager who stated their workers comp is through a company with the phone number of [phone].

## 2015-11-29 NOTE — Progress Notes (Signed)
Transferred off to primary nurse

## 2015-11-29 NOTE — Care Coordination-Inpatient (Signed)
DISCHARGE BARRIERS    Order received for Living Will and DPOA-spoke with RN and consult placed for spiritual care staff to address.

## 2015-11-29 NOTE — Progress Notes (Signed)
Comfortable  Improved pain on PO meds  Right hand, dressing in place. Less digital swelling. Better ROM sensate. Pink with good refill  Plan for D/C to day after PICC/Mid line placement   apparently refused yesterday but when asked, she states the she was told the PICC line team did not have enough time to do it. Stressed the importance of having that done today.

## 2015-11-29 NOTE — Procedures (Signed)
Midline insertion Procedure Note    Theresa Hobbs   Admitted- 11/26/2015  2:40 PM  Admission diagnosis- RIGHT HAND DOG BITE PALM      Attending Physician- Bill SalinasMichael J Muha, MD  Ordering Physician-Dr. Aletha Halimkamepalli  Indication for Insertion: Antibiotic Therapy    Catheter Insertion Date- 11/29/2015   Catheter Brand-bioflo  Lot Number- [id number]  Gauge-4  Lumen-single    Insertion Site- LUA Basilic  Vein Diameter- >3 mm  Catheter Length- 20 cm  Internal Length- 19 cm  Exposed Catheter Length- 1cm   Midline Tip Terminates in the Axillary- Yes  Upper Arm Circumference- 39cm  Easy insertion- Yes  Able to Aspirate blood- Yes  Easy Flush- Yes    Midline insertion successful- Yes  Ultrasound- yes    Okay To Use Midline- Yes    Electronically signed by Roland EarlBrenda D Tamir Wallman, RN on 11/29/2015 at 12:38 PM

## 2015-11-29 NOTE — Discharge Summary (Signed)
[facility]                       [address] [place]  [address]                                   DISCHARGE SUMMARY    PATIENT NAME:  CALEYAH, JR A.                    DOB:      Jul 23, 1966  MED REC NO:    [id number]                        ROOM:     5K 0023A  ACCOUNT NO:    [id number]                          ADMISSION DATE: 11/26/2015  PHYSICIAN:     Enid Skeens, PA-C                DISCHARGE DATE: 11/29/2015      PROCEDURE:  I and D right hand deep palmar compartment, thenar eminence, on  11/27/2015.    CONSULTATION:  Infectious disease, occupational therapy.      BRIEF HISTORY OF PRESENT ILLNESS:  BWC claim/[employer].      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  On 11/23/2015 while at work she was  grooming a [place].  After she groomed him, she was training him with a  lead line.  He turned to grab the lead line when he penetrated her hand.  She  noted subsequent swelling, progressive erythema, and increasing pain after the  injury.  She was evaluated at [facility] where they gave her oral antibiotics  and referred to Dr. Arneta Cliche for additional evaluation.  He felt that she had an  impending septic tenosynovitis and recommended I and D.  Therefore, she was  admitted for IV antibiotics at [facility], underwent I and D of  right hand mid palmar deep space and right thenar space on 11/27/2015.    She was admitted for perioperative antibiotics IV per Dr. Aletha Halim.  She  remained afebrile throughout her stay.  There was no leukocytosis upon  admission nor upon discharge.  The cultures that were obtained yielded no  growth for any specific organism.     A midline was placed prior to her discharge and IV antibiotics were  recommended by Dr. Aletha Halim to continue on discharge for up to 4 weeks  postop.    She was discharged in stable condition.  She had a volar splint in place on  the  right hand, was recommended to do daily dressing changes.  She was sent  home with an occupational therapy order to facilitate range of motion of the  digits and wrist, also for edema control.  As far as work, left-handed use  only, must keep midline clean and dry _____.  Follow up in 1 week for wound  recheck.  She may take the bandage off daily, work on gentle range of motion,  get incision wet, pat dry and replace splint until occupation therapy has  started and/or evaluated at first postop visit.        Enid Skeens, PA-C  D: 11/30/2015 12:16:08  T: 11/30/2015 12:56:48  FF/nts  Job#: [id number]  Doc#: [id number]

## 2015-11-29 NOTE — Progress Notes (Signed)
Patient refused Blood draw this morning.  Pharmacist notified  that vanc trough not completed.  Instructed to give vancomycin dose at this am and will try later.

## 2015-11-29 NOTE — Other (Addendum)
Patient Acct Nbr:  [id number]  Primary AUTH/CERT:    Primary Insurance Company Name:   [facility]  Primary Insurance Plan Name:  [facility]  Primary Insurance Group Number:  [id number]  Primary Insurance Plan Type:   Primary Insurance Policy Number:  [id number]    Secondary AUTH/CERT:    Secondary Insurance Company Name:   [facility]  Secondary Insurance Plan Name:  [facility] HLTH  Secondary Insurance Group Number:  [id number]  Secondary Insurance Plan Type:   Secondary Insurance Policy Number:  [id number]    Tertiary AUTH/CERT:    UAL Corporationertiary Insurance Company Name:   [facility]  Tertiary Insurance Plan Name:  SECONDARY PAYER  [employer] Br[employer]ertiary Insurance Group Number:    [employer]ertiary Insurance Plan Type:   Sara Leeertiary Insurance Policy Number:  [id number]

## 2015-11-29 NOTE — Care Coordination-Inpatient (Signed)
Outpatient IV Invanz set up through outpatient nursing, at 1:30  3/28, x14 days on the [facility]abs and dressing changes also. Outpatient OT was ordered, no one answered to make appt, left a message will attempt to schedule tomorrow, I will also give the patient the number to call herself and schedule. Patient was agreeable to do all her services at once here at the hospital.

## 2015-11-29 NOTE — Care Coordination-Inpatient (Signed)
Theresa RankinsJo A Hobbs       [facility]  11/26/2015/ 1440 Hospital day: 3   Location: 5K-23/023-A Reason for admit: RIGHT HAND DOG BITE PALM Status: inpt  Admit order signed?: yes  PMH:  has a past medical history of Anxiety; Asthma; Cancer (HCC); Chronic kidney disease; Depression; Diabetes mellitus (HCC); Fibromyalgia; HTN (hypertension); Lupus (HCC); MRSA (methicillin resistant Staphylococcus aureus); Post traumatic stress disorder (PTSD); Psychiatric problem; and Seizures (HCC).  Procedure: I&D thenar and midpalmar deep space infections  Pertinent abnormal Imaging:none  Medications:  Scheduled Meds:  ??? ertapenem (INVANZ) IVPB  1 g Intravenous Q24H   ??? lidocaine PF  5 mL Intradermal Once   ??? sodium chloride flush  10 mL Intravenous 2 times per day   ??? venlafaxine  225 mg Oral Daily   ??? pramipexole  0.25 mg Oral Nightly   ??? traZODone  100 mg Oral Nightly   ??? estrogens (conjugated)  0.9 mg Oral Daily   ??? pantoprazole  40 mg Oral QAM AC   ??? levETIRAcetam  500 mg Oral BID   ??? metFORMIN  500 mg Oral BID WC   ??? phenytoin  100 mg Oral TID     Continuous Infusions:  ??? sodium chloride 20 mL/hr at 11/26/15 1755      Pertinent Info/Orders/Treatment Plan:  IV Invanz, WOund care, and occupational therapy  Diet: DIET GENERAL;   DVT Prophylaxis: patient ambulatory  Smoking status:  reports that she has been smoking Cigarettes.  She has a 0.60 pack-year smoking history. She has never used smokeless tobacco.   Influenza Vaccination Screening Completed: yes  Pneumonia Vaccination Screening Completed: yes  Core measures: vte  [facility]  Readmission:  none  Risk Score: 6.5     Discharge Planning  Current Residence:  Private Residence  Living Arrangements:  Alone   Support Systems:  Friends/Neighbors  Current Services PTA:     Potential Assistance Needed:  N/A  Potential Assistance Purchasing Medications:  No  Does patient want to participate in local refill/ meds to beds program?   No  Type of Home Care Services:  None  Patient expects to be discharged to:  home  Expected Discharge date:  11/29/15  Follow Up Appointment: Best Day/ Time: Monday AM    Discharge Plan: home outpatient nursing doing IV antibiotics and wound care, and outpatient therapy    Social Services Evaluation: no

## 2015-11-29 NOTE — Progress Notes (Signed)
Clarified with Enid SkeensFaye Felkey PA about off work script she work and she states "Patient may work as long as she can use her left hand only, otherwise off work".   Clarified with as well with NP Jodean LimaLaurie Schnipke who stated the same thing "patient may work as long as she can use her left hand only, otherwise off work".  Patient states notified her boss and is ready to be taken down to her vehicle.

## 2015-11-29 NOTE — Progress Notes (Signed)
Awake alert oriented x4. Speech clear. Mood pleasant. Well groomed. Pain 6/10. Head normocephalic. CN7 intact. Positive cap refill. Right and left wrist edema and pain. Irises equally sized and colored. Pupils round and black. Sclera white. Corneas clear with no abrasions. Conjunctiva pink, moist. PERRLA noted. Nose symmetrical to midline. Full patency of both nares. turbinates pink, moist. Buccal mucosa pink moist. No hyperplasia noted. Hard and soft palates intact. CN12 intact. CN8 intact. Trachea midline. No obvious masses. No carotid artery bruit. Lungs easy even unlabored. No thrills in chest. Clear s1, s2 sounds. Cap refill diminished due to edema and acrylic nails. Abdomen symmetrical to umbilicus. Active bowel sounds in all four quadrants.

## 2015-11-30 ENCOUNTER — Inpatient Hospital Stay: Admit: 2015-11-30 | Attending: Infectious Disease | Primary: Family

## 2015-11-30 ENCOUNTER — Encounter: Primary: Family

## 2015-11-30 DIAGNOSIS — L03113 Cellulitis of right upper limb: Secondary | ICD-10-CM

## 2015-11-30 LAB — COMPREHENSIVE METABOLIC PANEL
ALT: 90 U/L — ABNORMAL HIGH (ref 11–66)
AST: 97 U/L — ABNORMAL HIGH (ref 5–40)
Albumin: 3.5 gm/dl (ref 3.5–5.1)
Alkaline Phosphatase: 176 U/L — ABNORMAL HIGH (ref 38–126)
BUN: 10 mg/dl (ref 7–22)
CO2: 29 meq/l (ref 23–33)
Calcium: 8.7 mg/dl (ref 8.5–10.5)
Chloride: 100 meq/l (ref 98–111)
Creatinine: 0.7 mg/dl (ref 0.4–1.2)
Glucose: 110 mg/dl — ABNORMAL HIGH (ref 70–108)
Potassium: 4.1 meq/l (ref 3.5–5.2)
Sodium: 142 meq/l (ref 135–145)
Total Bilirubin: 0.6 mg/dl (ref 0.3–1.2)
Total Protein: 6.3 gm/dl (ref 6.1–8.0)

## 2015-11-30 LAB — CBC
Hematocrit: 41.1 % (ref 37.0–47.0)
Hemoglobin: 14.2 gm/dl (ref 12.0–16.0)
MCH: 29.1 pg (ref 27.0–31.0)
MCHC: 34.6 gm/dl (ref 33.0–37.0)
MCV: 84.1 fL (ref 81.0–99.0)
MPV: 9.7 mcm (ref 7.4–10.4)
Platelets: 179 10*3/uL (ref 130–400)
RBC: 4.88 10*6/uL (ref 4.20–5.40)
RDW: 13.5 % (ref 11.5–14.5)
WBC: 7.7 10*3/uL (ref 4.8–10.8)

## 2015-11-30 LAB — ANION GAP: Anion Gap: 13 meq/l (ref 8.0–16.0)

## 2015-11-30 LAB — GLOMERULAR FILTRATION RATE, ESTIMATED: Est, Glom Filt Rate: 89 mL/min/{1.73_m2} — AB

## 2015-11-30 MED ORDER — ERTAPENEM SODIUM 1 G IJ SOLR
1 GM | Freq: Once | INTRAMUSCULAR | Status: AC
Start: 2015-11-30 — End: 2015-11-30
  Administered 2015-11-30: 18:00:00 1000 g via INTRAVENOUS

## 2015-11-30 MED ORDER — NORMAL SALINE FLUSH 0.9 % IV SOLN
0.9 % | INTRAVENOUS | Status: AC | PRN
Start: 2015-11-30 — End: 2015-12-01
  Administered 2015-11-30: 18:00:00 10 mL via INTRAVENOUS

## 2015-11-30 MED FILL — INVANZ 1 G IJ SOLR: 1 g | INTRAMUSCULAR | Qty: 1000

## 2015-11-30 NOTE — Progress Notes (Signed)
[id number] Alert female admitted for invanz and dressing change, procedure reviewed.Pt rights and responsibilities offered to pt to read.  1400 Old dressing removed right hand small amount rusty drainage. Small crusted area above right thumb. Incision line palm of right hand intact with sutures incision line pink and edematous, surrounding skin intact. Area cleasened with salineTelfa to suture line and then wrapped with kerlix, splint and ace wrap. Slight swelling in fingers movement good in fingers. Tolerated well.  1440 Home instructions to pt verbalized understanding. Gait steady.  1444 Discharged ambulatory stable home with [place] [facility].        _met___ Safety:       (Environmental)  ??? Orient to environment  ??? Ensure ID band is correct and in place/ allergy band as needed  ??? Assess for fall risk  ??? Initiate fall precautions as applicable (fall band, side rails, etc.)  ??? Call light within reach  ??? Bed in low position/ wheels locked    __nm__ Pain:       ??? Assess pain level and characteristics  ??? Administer analgesics as ordered  ??? Assess effectiveness of pain management and report to MD as needed    __met_ Knowledge Deficit:  ??? Assess baseline knowledge  ??? Provide teaching at level of understanding  ??? Provide teaching via preferred learning method  ??? Evaluate teaching effectiveness    ___met___ Infection-Risk of Central Venous Catheter:  ??? Monitor for infection signs and symptoms (catheter site redness, temperature elevation, etc)  ??? Assess for infection risks  ??? Educate regarding infection prevention  ??? Manage central venous catheter (flushes/ dressing changes per protocol)

## 2015-12-01 ENCOUNTER — Inpatient Hospital Stay: Admit: 2015-12-01 | Attending: Infectious Disease | Primary: Family

## 2015-12-01 ENCOUNTER — Encounter: Primary: Family

## 2015-12-01 DIAGNOSIS — L03113 Cellulitis of right upper limb: Secondary | ICD-10-CM

## 2015-12-01 LAB — EKG 12-LEAD
Atrial Rate: 74 {beats}/min
P Axis: 33 degrees
P-R Interval: 132 ms
Q-T Interval: 376 ms
QRS Duration: 82 ms
QTc Calculation (Bazett): 417 ms
R Axis: 0 degrees
T Axis: 26 degrees
Ventricular Rate: 74 {beats}/min

## 2015-12-01 MED ORDER — ERTAPENEM SODIUM 1 G IJ SOLR
1 GM | Freq: Once | INTRAMUSCULAR | Status: AC
Start: 2015-12-01 — End: 2015-12-01
  Administered 2015-12-01: 18:00:00 1000 g via INTRAVENOUS

## 2015-12-01 MED ORDER — NORMAL SALINE FLUSH 0.9 % IV SOLN
0.9 % | INTRAVENOUS | Status: AC | PRN
Start: 2015-12-01 — End: 2015-12-02
  Administered 2015-12-01 (×2): 10 mL via INTRAVENOUS

## 2015-12-01 MED FILL — INVANZ 1 G IJ SOLR: 1 g | INTRAMUSCULAR | Qty: 1000

## 2015-12-01 NOTE — Progress Notes (Signed)
1323:  ARRIVES AMBULATORY FOR RIGHT HAND DRESSING CHANGE AND IV ANTIBIOTIC.  PROCESS REVIEWED AND PT RIGHTS AND RESPONSIBILITIES OFFERED TO PT.  BOTH MIDLINE AND RIGHT HAND DRESSING APPEAR WET.  EDUCATED PT ON THE IMPORTANCE OF NOT GETTING EITHER DRESSING WET.     1330:  RIGHT HAND DRESSING REMOVED.  SCANT SANGUINOUS DRAINAGE NOTED ON INNER DRESSING.  NO STRIKE THROUGH.  PALM OF HAND CLEANSED WITH SALINE.  INCISION LINE GAPPED WITH SUTURES INTACT.  SLIGHT MACERATION NOTED.  AREA BASE OF THUMB DRY.  ADAPTIC TO PALM WOUND, 3X3 GAUZE, KERLEX, SPLINT AND ACE WRAP.  GOOD MOVEMENT OF FINGERS AND GOOD RADIAL PULSE.  1350:  MIDLINE DRESSING CHANGED.  1420:  TOLERATED INFUSION WELL.  PT DISCHARGED AMBULATORY WITH INSTRUCTIONS.    _M___ Safety:       (Environmental)  ??? Orient to environment  ??? Ensure ID band is correct and in place/ allergy band as needed  ??? Assess for fall risk  ??? Initiate fall precautions as applicable (fall band, side rails, etc.)  ??? Call light within reach  ??? Bed in low position/ wheels locked    _M___ Pain:       ??? Assess pain level and characteristics  ??? Administer analgesics as ordered  ??? Assess effectiveness of pain management and report to MD as needed    __M__ Knowledge Deficit:  ??? Assess baseline knowledge  ??? Provide teaching at level of understanding  ??? Provide teaching via preferred learning method  ??? Evaluate teaching effectiveness    _M___ Hemodynamic/Respiratory Status:       (Pre and Post Procedure Monitoring)  ??? Assess/Monitor vital signs and LOC  ??? Assess Baseline SpO2 prior to any sedation  ??? Obtain weight/height  ??? Assess vital signs/ LOC until patient meets discharge criteria  ??? Monitor procedure site and notify MD of any issues    __M__ Infection-Risk of Central Venous Catheter:   MIDLINE  ??? Monitor for infection signs and symptoms (catheter site redness, temperature elevation, etc)  ??? Assess for infection risks  ??? Educate regarding infection prevention  ??? Manage central venous catheter  (flushes/ dressing changes per protocol)

## 2015-12-02 ENCOUNTER — Inpatient Hospital Stay: Admit: 2015-12-02 | Attending: Infectious Disease | Primary: Family

## 2015-12-02 DIAGNOSIS — L03113 Cellulitis of right upper limb: Secondary | ICD-10-CM

## 2015-12-02 MED ORDER — NORMAL SALINE FLUSH 0.9 % IV SOLN
0.9 % | INTRAVENOUS | Status: AC | PRN
Start: 2015-12-02 — End: 2015-12-03
  Administered 2015-12-02 (×2): 10 mL via INTRAVENOUS

## 2015-12-02 MED ORDER — ERTAPENEM SODIUM 1 G IJ SOLR
1 GM | Freq: Once | INTRAMUSCULAR | Status: AC
Start: 2015-12-02 — End: 2015-12-02
  Administered 2015-12-02: 18:00:00 1000 g via INTRAVENOUS

## 2015-12-02 MED FILL — INVANZ 1 G IJ SOLR: 1 g | INTRAMUSCULAR | Qty: 1000

## 2015-12-02 NOTE — Progress Notes (Signed)
[id number] Alert female admitted for invanz infusion, procedure reviewed, pt verbalized understanding.Pt rights and responsibilities offered to pt to read.Pt dressing not changed today, had just been changed at occupational health. Fingers pink, easily wiggled.  1410 Infusion complete, tolerated well. Home instructions to pt verbalized understanding. Gait steady.  1420 Discharged ambulatory stable home.              __met__ Safety:       (Environmental)  ??? Orient to environment  ??? Ensure ID band is correct and in place/ allergy band as needed  ??? Assess for fall risk  ??? Initiate fall precautions as applicable (fall band, side rails, etc.)  ??? Call light within reach  ??? Bed in low position/ wheels locked    __nm__ Pain:       ??? Assess pain level and characteristics  ??? Administer analgesics as ordered  ??? Assess effectiveness of pain management and report to MD as needed    ___met_ Knowledge Deficit:  ??? Assess baseline knowledge  ??? Provide teaching at level of understanding  ??? Provide teaching via preferred learning method  ??? Evaluate teaching effectiveness    ___met__ Infection-Risk of Central Venous Catheter:  ??? Monitor for infection signs and symptoms (catheter site redness, temperature elevation, etc)  ??? Assess for infection risks  ??? Educate regarding infection prevention  ??? Manage central venous catheter (flushes/ dressing changes per protocol)

## 2015-12-03 ENCOUNTER — Inpatient Hospital Stay: Admit: 2015-12-03 | Attending: Infectious Disease | Primary: Family

## 2015-12-03 ENCOUNTER — Encounter: Primary: Family

## 2015-12-03 DIAGNOSIS — L03113 Cellulitis of right upper limb: Secondary | ICD-10-CM

## 2015-12-03 LAB — CBC WITH AUTO DIFFERENTIAL
Basophils Absolute: 0 10*3/uL (ref 0.0–0.1)
Basophils: 0.5 %
Eosinophils Absolute: 0.3 10*3/uL (ref 0.0–0.4)
Eosinophils: 4.1 %
Hematocrit: 44.5 % (ref 37.0–47.0)
Hemoglobin: 15 gm/dl (ref 12.0–16.0)
Lymphocytes Absolute: 2.3 10*3/uL (ref 1.0–4.8)
Lymphocytes: 27.3 %
MCH: 29 pg (ref 27.0–31.0)
MCHC: 33.8 gm/dl (ref 33.0–37.0)
MCV: 85.8 fL (ref 81.0–99.0)
MPV: 9.6 mcm (ref 7.4–10.4)
Monocytes Absolute: 0.5 10*3/uL (ref 0.4–1.3)
Monocytes: 5.3 %
Platelets: 210 10*3/uL (ref 130–400)
RBC Morphology: NORMAL
RBC: 5.18 10*6/uL (ref 4.20–5.40)
RDW: 14 % (ref 11.5–14.5)
Seg Neutrophils: 62.8 %
Segs Absolute: 5.3 10*3/uL (ref 1.8–7.7)
WBC: 8.5 10*3/uL (ref 4.8–10.8)
nRBC: 0 /100 wbc

## 2015-12-03 LAB — COMPREHENSIVE METABOLIC PANEL
ALT: 148 U/L — ABNORMAL HIGH (ref 11–66)
AST: 45 U/L — ABNORMAL HIGH (ref 5–40)
Albumin: 3.6 gm/dl (ref 3.5–5.1)
Alkaline Phosphatase: 258 U/L — ABNORMAL HIGH (ref 38–126)
BUN: 11 mg/dl (ref 7–22)
CO2: 24 meq/l (ref 23–33)
Calcium: 9.3 mg/dl (ref 8.5–10.5)
Chloride: 102 meq/l (ref 98–111)
Creatinine: 0.8 mg/dl (ref 0.4–1.2)
Glucose: 147 mg/dl — ABNORMAL HIGH (ref 70–108)
Potassium: 3.9 meq/l (ref 3.5–5.2)
Sodium: 144 meq/l (ref 135–145)
Total Bilirubin: 0.4 mg/dl (ref 0.3–1.2)
Total Protein: 6.9 gm/dl (ref 6.1–8.0)

## 2015-12-03 LAB — ANION GAP: Anion Gap: 18 meq/l — ABNORMAL HIGH (ref 8.0–16.0)

## 2015-12-03 LAB — GLOMERULAR FILTRATION RATE, ESTIMATED: Est, Glom Filt Rate: 76 mL/min/{1.73_m2} — AB

## 2015-12-03 MED ORDER — ERTAPENEM SODIUM 1 G IJ SOLR
1 GM | Freq: Once | INTRAMUSCULAR | Status: AC
Start: 2015-12-03 — End: 2015-12-03
  Administered 2015-12-03: 18:00:00 1000 g via INTRAVENOUS

## 2015-12-03 MED ORDER — NORMAL SALINE FLUSH 0.9 % IV SOLN
0.9 % | INTRAVENOUS | Status: AC | PRN
Start: 2015-12-03 — End: 2015-12-04
  Administered 2015-12-03 (×2): 10 mL via INTRAVENOUS

## 2015-12-03 MED FILL — INVANZ 1 G IJ SOLR: 1 g | INTRAMUSCULAR | Qty: 1000

## 2015-12-03 NOTE — Progress Notes (Addendum)
1:23 PM  Patient admitted to OPND for IV antibiotic, labs.  PATIENT RIGHTS AND RESPONSIBILITIES SHEET OFFERED TO PT TO READ.   1334:  Dressing removed from right hand.  Small amount of serosanguinous drainage on old dressing.  Palm of hand cleansed with normal saline.  Telfa applied over incision, wrapped with Kerlix, splint (positioned by patient) and ACE wrap applied.  Patient tolerated well.  Patient moved fingers easily also. Call light in reach.   1415:  IV antibiotic complete.  Patient tolerated well.  1419:  DISCHARGE INSTRUCTIONS PROVIDED TO PATIENT, AND SHE VERBALIZED UNDERSTANDING.  PT DISCHARGED AMBULATORY IN SATISFACTORY CONDITION.  Randa Spikendrea R Filomena Pokorney 12/03/2015 2:20 PM     __m__ Safety:       (Environmental)  ??? Orient to environment  ??? Ensure ID band is correct and in place/ allergy band as needed  ??? Assess for fall risk  ??? Initiate fall precautions as applicable (fall band, side rails, etc.)  ??? Call light within reach  ??? Bed in low position/ wheels locked    __nm__ Pain:       ??? Assess pain level and characteristics  ??? Administer analgesics as ordered  ??? Assess effectiveness of pain management and report to MD as needed    __m__ Knowledge Deficit:  ??? Assess baseline knowledge  ??? Provide teaching at level of understanding  ??? Provide teaching via preferred learning method  ??? Evaluate teaching effectiveness    __m__ Hemodynamic/Respiratory Status:       (Pre and Post Procedure Monitoring)  ??? Assess/Monitor vital signs and LOC  ??? Assess Baseline SpO2 prior to any sedation  ??? Obtain weight/height  ??? Assess vital signs/ LOC until patient meets discharge criteria  ??? Monitor procedure site and notify MD of any issues    __m__ Infection-Risk of Central Venous Catheter:  ??? Monitor for infection signs and symptoms (catheter site redness, temperature elevation, etc)  ??? Assess for infection risks  ??? Educate regarding infection prevention  ??? Manage central venous catheter (flushes/ dressing changes per  protocol)      1504:  LABS FAXED TO DR. Aletha HalimKAMEPALLI.

## 2015-12-04 ENCOUNTER — Inpatient Hospital Stay: Attending: Infectious Disease | Primary: Family

## 2015-12-04 DIAGNOSIS — L03113 Cellulitis of right upper limb: Secondary | ICD-10-CM

## 2015-12-04 MED ORDER — NORMAL SALINE FLUSH 0.9 % IV SOLN
0.9 % | INTRAVENOUS | Status: AC | PRN
Start: 2015-12-04 — End: 2015-12-05
  Administered 2015-12-04 (×2): 10 mL via INTRAVENOUS

## 2015-12-04 MED ORDER — SODIUM CHLORIDE 0.9 % IV SOLN (MINI-BAG)
0.9 % | Freq: Once | INTRAVENOUS | Status: AC
Start: 2015-12-04 — End: 2015-12-04
  Administered 2015-12-04: 18:00:00 1000 g via INTRAVENOUS

## 2015-12-04 MED FILL — INVANZ 1 G IJ SOLR: 1 g | INTRAMUSCULAR | Qty: 1000

## 2015-12-04 NOTE — ED Notes (Signed)
Right hand dressing change completed, clesnsed with wound wash covered with telfa, 4x4, kerlix, splint reapplied     Asencion GowdaAndrew Fredrik Mogel, RN  12/04/15 1357

## 2015-12-04 NOTE — Progress Notes (Signed)
__met__ Safety   GOAL: Patient will have ID or Allergy band on in the Urgent Care       (Environmental)  ??? Orient to environment  ??? Ensure ID band is correct and in place/ allergy band as needed  ??? Assess for fall risk  ??? Initiate fall precautions as applicable (fall band, side rails, etc.)  ??? Call light within reach  ??? Bed in low position/ wheels locked    __met__ Pain   GOAL:  Patient pain will be under control while here in the Urgent Care     ??? Assess pain level and characteristics  ??? Administer analgesics as ordered  ??? Assess effectiveness of pain management and report to MD as needed    _met__ Knowledge Deficit:   GOAL: Patient will be educated on the medication they are receiving here in the Urgent Care  ??? Assess baseline knowledge  ??? Provide teaching at level of understanding  ??? Provide teaching via preferred learning method  ??? Evaluate teaching effectiveness    __met__ Hemodynamic/Respiratory Status:   GOAL: Patient vital signs will be assessed and monitored in the Urgent Care       (Pre and Post Procedure Monitoring)  ??? Assess/Monitor vital signs and LOC  ??? Assess Baseline SpO2 prior to any sedation  ??? Obtain weight/height  ??? Assess vital signs/ LOC until patient meets discharge criteria  ??? Monitor procedure site and notify MD of any issues    __met__ Infection-Risk of Central Venous Catheter:   GOAL: Patient will be monitored for infection while in the Urgent Care  ??? Monitor for infection signs and symptoms (catheter site redness, temperature elevation, etc)  ??? Assess for infection risks  ??? Educate regarding infection prevention  ??? Manage central venous catheter (flushes/ dressing changes per protocol)    CARE PLAN END DATE:

## 2015-12-05 ENCOUNTER — Inpatient Hospital Stay: Admit: 2015-12-05 | Attending: Nurse Practitioner | Primary: Family

## 2015-12-05 DIAGNOSIS — L03113 Cellulitis of right upper limb: Secondary | ICD-10-CM

## 2015-12-05 MED ORDER — NORMAL SALINE FLUSH 0.9 % IV SOLN
0.9 % | INTRAVENOUS | Status: AC | PRN
Start: 2015-12-05 — End: 2015-12-06
  Administered 2015-12-05 (×2): 10 mL via INTRAVENOUS

## 2015-12-05 MED ORDER — ERTAPENEM SODIUM 1 G IJ SOLR
1 GM | Freq: Once | INTRAMUSCULAR | Status: AC
Start: 2015-12-05 — End: 2015-12-05
  Administered 2015-12-05: 18:00:00 1000 g via INTRAVENOUS

## 2015-12-05 MED FILL — INVANZ 1 G IJ SOLR: 1 g | INTRAMUSCULAR | Qty: 1000

## 2015-12-05 NOTE — Progress Notes (Signed)
Patient walked to room 1 for IV infusion, vital signs and assessment done at this time.

## 2015-12-05 NOTE — Progress Notes (Signed)
IV infusion completed. Tolerated well, vitals obtained, stable. Ambulatory to exit with no problems

## 2015-12-05 NOTE — Progress Notes (Signed)
Wound dressing changed. Wound cleansed with wound wash and allowed to dry. Telfa applied to wound. Kerlex applied over telfa followed by ace wrap. No concerns. [employer] and [employer] mist given.

## 2015-12-05 NOTE — Progress Notes (Signed)
__met__ Safety   GOAL: Patient will have ID or Allergy band on in the Urgent Care       (Environmental)  ??? Orient to environment  ??? Ensure ID band is correct and in place/ allergy band as needed  ??? Assess for fall risk  ??? Initiate fall precautions as applicable (fall band, side rails, etc.)  ??? Call light within reach  ??? Bed in low position/ wheels locked    __met__ Pain   GOAL:  Patient pain will be under control while here in the Urgent Care     ??? Assess pain level and characteristics  ??? Administer analgesics as ordered  ??? Assess effectiveness of pain management and report to MD as needed    _met__ Knowledge Deficit:   GOAL: Patient will be educated on the medication they are receiving here in the Urgent Care  ??? Assess baseline knowledge  ??? Provide teaching at level of understanding  ??? Provide teaching via preferred learning method  ??? Evaluate teaching effectiveness    __met__ Hemodynamic/Respiratory Status:   GOAL: Patient vital signs will be assessed and monitored in the Urgent Care       (Pre and Post Procedure Monitoring)  ??? Assess/Monitor vital signs and LOC  ??? Assess Baseline SpO2 prior to any sedation  ??? Obtain weight/height  ??? Assess vital signs/ LOC until patient meets discharge criteria  ??? Monitor procedure site and notify MD of any issues    __met__ Infection-Risk of Central Venous Catheter:   GOAL: Patient will be monitored for infection while in the Urgent Care  ??? Monitor for infection signs and symptoms (catheter site redness, temperature elevation, etc)  ??? Assess for infection risks  ??? Educate regarding infection prevention  ??? Manage central venous catheter (flushes/ dressing changes per protocol)    CARE PLAN END DATE: 12/14/2015

## 2015-12-06 ENCOUNTER — Inpatient Hospital Stay: Admit: 2015-12-06 | Attending: Infectious Disease | Primary: Family

## 2015-12-06 DIAGNOSIS — L03113 Cellulitis of right upper limb: Secondary | ICD-10-CM

## 2015-12-06 LAB — COMPREHENSIVE METABOLIC PANEL
ALT: 66 U/L (ref 11–66)
AST: 17 U/L (ref 5–40)
Albumin: 3.4 gm/dl — ABNORMAL LOW (ref 3.5–5.1)
Alkaline Phosphatase: 200 U/L — ABNORMAL HIGH (ref 38–126)
BUN: 14 mg/dl (ref 7–22)
CO2: 22 meq/l — ABNORMAL LOW (ref 23–33)
Calcium: 8.9 mg/dl (ref 8.5–10.5)
Chloride: 103 meq/l (ref 98–111)
Creatinine: 0.7 mg/dl (ref 0.4–1.2)
Glucose: 136 mg/dl — ABNORMAL HIGH (ref 70–108)
Potassium: 3.7 meq/l (ref 3.5–5.2)
Sodium: 141 meq/l (ref 135–145)
Total Bilirubin: 0.2 mg/dl — ABNORMAL LOW (ref 0.3–1.2)
Total Protein: 6.4 gm/dl (ref 6.1–8.0)

## 2015-12-06 LAB — CBC
Hematocrit: 41.3 % (ref 37.0–47.0)
Hemoglobin: 14 gm/dl (ref 12.0–16.0)
MCH: 29.4 pg (ref 27.0–31.0)
MCHC: 34 gm/dl (ref 33.0–37.0)
MCV: 86.4 fL (ref 81.0–99.0)
MPV: 9.5 mcm (ref 7.4–10.4)
Platelets: 213 10*3/uL (ref 130–400)
RBC: 4.78 10*6/uL (ref 4.20–5.40)
RDW: 14.1 % (ref 11.5–14.5)
WBC: 8.9 10*3/uL (ref 4.8–10.8)

## 2015-12-06 LAB — GLOMERULAR FILTRATION RATE, ESTIMATED: Est, Glom Filt Rate: 89 mL/min/{1.73_m2} — AB

## 2015-12-06 LAB — ANION GAP: Anion Gap: 16 meq/l (ref 8.0–16.0)

## 2015-12-06 MED ORDER — SODIUM CHLORIDE 0.9 % IV SOLN (MINI-BAG)
0.9 % | Freq: Once | INTRAVENOUS | Status: AC
Start: 2015-12-06 — End: 2015-12-06
  Administered 2015-12-06: 17:00:00 1000 g via INTRAVENOUS

## 2015-12-06 MED ORDER — NORMAL SALINE FLUSH 0.9 % IV SOLN
0.9 % | INTRAVENOUS | Status: AC | PRN
Start: 2015-12-06 — End: 2015-12-07
  Administered 2015-12-06 (×2): 10 mL via INTRAVENOUS

## 2015-12-06 MED FILL — INVANZ 1 G IJ SOLR: 1 g | INTRAMUSCULAR | Qty: 1000

## 2015-12-06 NOTE — Progress Notes (Signed)
1259:  ARRIVES AMBULATORY FOR IV INVANZ.  PROCESS REVIEWED AND PT RIGHTS AND RESPONSIBILITIES OFFERED TO PT.  PT STATES SHE JUST CAME FROM DR. MUHA OFFICE AND THEY CHANGED RIGHT ARM/HAND DRESSING.  1325:  LAB HERE TO DRAW LABS.  1400:  TOLERATED INFUSION WELL.  PT DISCHARGED AMBULATORY WITH INSTRUCTIONS.    _M___ Safety:       (Environmental)  ??? Orient to environment  ??? Ensure ID band is correct and in place/ allergy band as needed  ??? Assess for fall risk  ??? Initiate fall precautions as applicable (fall band, side rails, etc.)  ??? Call light within reach  ??? Bed in low position/ wheels locked    _M___ Pain:       ??? Assess pain level and characteristics  ??? Administer analgesics as ordered  ??? Assess effectiveness of pain management and report to MD as needed    _M___ Knowledge Deficit:  ??? Assess baseline knowledge  ??? Provide teaching at level of understanding  ??? Provide teaching via preferred learning method  ??? Evaluate teaching effectiveness    M____ Hemodynamic/Respiratory Status:       (Pre and Post Procedure Monitoring)  ??? Assess/Monitor vital signs and LOC  ??? Assess Baseline SpO2 prior to any sedation  ??? Obtain weight/height  ??? Assess vital signs/ LOC until patient meets discharge criteria  ??? Monitor procedure site and notify MD of any issues    _M___ Infection-Risk of Central Venous Catheter:   MIDLINE LUA  ??? Monitor for infection signs and symptoms (catheter site redness, temperature elevation, etc)  ??? Assess for infection risks  ??? Educate regarding infection prevention  ??? Manage central venous catheter (flushes/ dressing changes per protocol)

## 2015-12-07 ENCOUNTER — Inpatient Hospital Stay: Admit: 2015-12-07 | Attending: Infectious Disease | Primary: Family

## 2015-12-07 DIAGNOSIS — L03113 Cellulitis of right upper limb: Secondary | ICD-10-CM

## 2015-12-07 MED ORDER — NORMAL SALINE FLUSH 0.9 % IV SOLN
0.9 % | INTRAVENOUS | Status: AC | PRN
Start: 2015-12-07 — End: 2015-12-08
  Administered 2015-12-07: 20:00:00 10 mL via INTRAVENOUS

## 2015-12-07 MED ORDER — ERTAPENEM SODIUM 1 G IJ SOLR
1 GM | Freq: Once | INTRAMUSCULAR | Status: AC
Start: 2015-12-07 — End: 2015-12-07
  Administered 2015-12-07: 19:00:00 1000 g via INTRAVENOUS

## 2015-12-07 MED FILL — INVANZ 1 G IJ SOLR: 1 g | INTRAMUSCULAR | Qty: 1000

## 2015-12-07 NOTE — Progress Notes (Signed)
2:31 PM  Patient arrived to [facility] for IV antibiotic, stated dressing needs changed on mid-line due to it getting wet.  Patient came from Dr. Suzi RootsKamepalli's office where dressing change to right hand was done by L. Schnipke, CNP.  Snack gotten for patient.  [id number] Joiner:  Rebecca, RN, IV therapy, here to change dressing on mid-line.  1553:  IV COMPLETE.  PATIENT TOLERATED WELL.  DISCHARGE INSTRUCTIONS PROVIDED TO PATIENT, AND SHE VERBALIZED UNDERSTANDING.  PT DISCHARGED AMBULATORY IN SATISFACTORY CONDITION.  Randa Spikendrea R Harlin Mazzoni 12/07/2015 3:54 PM     __m__ Safety:       (Environmental)  ??? Orient to environment  ??? Ensure ID band is correct and in place/ allergy band as needed  ??? Assess for fall risk  ??? Initiate fall precautions as applicable (fall band, side rails, etc.)  ??? Call light within reach  ??? Bed in low position/ wheels locked    __m__ Pain:       ??? Assess pain level and characteristics  ??? Administer analgesics as ordered  ??? Assess effectiveness of pain management and report to MD as needed    __m__ Knowledge Deficit:  ??? Assess baseline knowledge  ??? Provide teaching at level of understanding  ??? Provide teaching via preferred learning method  ??? Evaluate teaching effectiveness    __m__ Hemodynamic/Respiratory Status:       (Pre and Post Procedure Monitoring)  ??? Assess/Monitor vital signs and LOC  ??? Assess Baseline SpO2 prior to any sedation  ??? Obtain weight/height  ??? Assess vital signs/ LOC until patient meets discharge criteria  ??? Monitor procedure site and notify MD of any issues    __m__ Infection-Risk of Central Venous Catheter:  ??? Monitor for infection signs and symptoms (catheter site redness, temperature elevation, etc)  ??? Assess for infection risks  ??? Educate regarding infection prevention  ??? Manage central venous catheter (flushes/ dressing changes per protocol)

## 2015-12-08 ENCOUNTER — Inpatient Hospital Stay: Admit: 2015-12-08 | Attending: Infectious Disease | Primary: Family

## 2015-12-08 DIAGNOSIS — L03113 Cellulitis of right upper limb: Secondary | ICD-10-CM

## 2015-12-08 MED ORDER — ERTAPENEM SODIUM 1 G IJ SOLR
1 GM | Freq: Once | INTRAMUSCULAR | Status: AC
Start: 2015-12-08 — End: 2015-12-08
  Administered 2015-12-08: 18:00:00 1000 g via INTRAVENOUS

## 2015-12-08 MED ORDER — NORMAL SALINE FLUSH 0.9 % IV SOLN
0.9 % | INTRAVENOUS | Status: AC | PRN
Start: 2015-12-08 — End: 2015-12-09
  Administered 2015-12-08 (×2): 10 mL via INTRAVENOUS

## 2015-12-08 MED FILL — INVANZ 1 G IJ SOLR: 1 g | INTRAMUSCULAR | Qty: 1000

## 2015-12-08 NOTE — Progress Notes (Signed)
1336:  ARRIVES AMBULATORY FOR IV INVANZ AND RIGHT HAND DRESSING CHANGE.  PROCESS REVIEWED AND PT RIGHTS AND RESPONSIBILITIES OFFERED TO PT.    1400:  RIGHT HAND DRESSING REMOVED.  WOUND CLEANSED WITH SALINE AND RE-DRESSED WITH ADAPTIC, KERLEX AND COBAN.    1425:  PT TOLERATED INFUSION WELL.  PT DISCHARGED AMBULATORY WITH INSTRUCTIONS.      _M___ Safety:       (Environmental)  ??? Orient to environment  ??? Ensure ID band is correct and in place/ allergy band as needed  ??? Assess for fall risk  ??? Initiate fall precautions as applicable (fall band, side rails, etc.)  ??? Call light within reach  ??? Bed in low position/ wheels locked    _M___ Pain:       ??? Assess pain level and characteristics  ??? Administer analgesics as ordered  ??? Assess effectiveness of pain management and report to MD as needed    _M___ Knowledge Deficit:  ??? Assess baseline knowledge  ??? Provide teaching at level of understanding  ??? Provide teaching via preferred learning method  ??? Evaluate teaching effectiveness    _M___ Hemodynamic/Respiratory Status:       (Pre and Post Procedure Monitoring)  ??? Assess/Monitor vital signs and LOC  ??? Assess Baseline SpO2 prior to any sedation  ??? Obtain weight/height  ??? Assess vital signs/ LOC until patient meets discharge criteria  ??? Monitor procedure site and notify MD of any issues    _M___ Infection-Risk of Central Venous Catheter:   MIDLINE  ??? Monitor for infection signs and symptoms (catheter site redness, temperature elevation, etc)  ??? Assess for infection risks  ??? Educate regarding infection prevention  ??? Manage central venous catheter (flushes/ dressing changes per protocol)

## 2015-12-09 ENCOUNTER — Inpatient Hospital Stay: Admit: 2015-12-09 | Attending: Infectious Disease | Primary: Family

## 2015-12-09 DIAGNOSIS — L03113 Cellulitis of right upper limb: Secondary | ICD-10-CM

## 2015-12-09 LAB — COMPREHENSIVE METABOLIC PANEL
ALT: 36 U/L (ref 11–66)
AST: 14 U/L (ref 5–40)
Albumin: 3.8 gm/dl (ref 3.5–5.1)
Alkaline Phosphatase: 184 U/L — ABNORMAL HIGH (ref 38–126)
BUN: 13 mg/dl (ref 7–22)
CO2: 23 meq/l (ref 23–33)
Calcium: 9.2 mg/dl (ref 8.5–10.5)
Chloride: 101 meq/l (ref 98–111)
Creatinine: 0.7 mg/dl (ref 0.4–1.2)
Glucose: 125 mg/dl — ABNORMAL HIGH (ref 70–108)
Potassium: 3.8 meq/l (ref 3.5–5.2)
Sodium: 141 meq/l (ref 135–145)
Total Bilirubin: 0.3 mg/dl (ref 0.3–1.2)
Total Protein: 7.1 gm/dl (ref 6.1–8.0)

## 2015-12-09 LAB — ANION GAP: Anion Gap: 17 meq/l — ABNORMAL HIGH (ref 8.0–16.0)

## 2015-12-09 LAB — CBC
Hematocrit: 43.1 % (ref 37.0–47.0)
Hemoglobin: 15 gm/dl (ref 12.0–16.0)
MCH: 29.5 pg (ref 27.0–31.0)
MCHC: 34.7 gm/dl (ref 33.0–37.0)
MCV: 85.1 fL (ref 81.0–99.0)
MPV: 9.5 mcm (ref 7.4–10.4)
Platelets: 233 10*3/uL (ref 130–400)
RBC: 5.06 10*6/uL (ref 4.20–5.40)
RDW: 13.8 % (ref 11.5–14.5)
WBC: 8.9 10*3/uL (ref 4.8–10.8)

## 2015-12-09 LAB — GLOMERULAR FILTRATION RATE, ESTIMATED: Est, Glom Filt Rate: 89 mL/min/{1.73_m2} — AB

## 2015-12-09 MED ORDER — SODIUM CHLORIDE 0.9 % IV SOLN (MINI-BAG)
0.9 % | Freq: Once | INTRAVENOUS | Status: AC
Start: 2015-12-09 — End: 2015-12-09
  Administered 2015-12-09: 18:00:00 1000 g via INTRAVENOUS

## 2015-12-09 MED ORDER — NORMAL SALINE FLUSH 0.9 % IV SOLN
0.9 % | INTRAVENOUS | Status: AC | PRN
Start: 2015-12-09 — End: 2015-12-10
  Administered 2015-12-09 (×2): 10 mL via INTRAVENOUS

## 2015-12-09 MED FILL — INVANZ 1 G IJ SOLR: 1 g | INTRAMUSCULAR | Qty: 1000

## 2015-12-09 NOTE — Progress Notes (Signed)
1337:  ARRIVES AMBULATORY FOR IV INVANZ.  PT FAMILIAR WITH PROCESS AND PT RIGHTS AND RESPONSIBILITIES OFFERED TO PT.  PT STATES SHE CHANGED HER HAND DRESSING AT HOME TODAY.    1355:  INFUSION CONTINUES.  PT EATING SNACK.    1406:  LAB HERE  1420:  TOLERATED INFUSION WELL.  PT DISCHARGED AMBULATORY WITH INSTRUCTIONS.      _M___ Safety:       (Environmental)  ??? Orient to environment  ??? Ensure ID band is correct and in place/ allergy band as needed  ??? Assess for fall risk  ??? Initiate fall precautions as applicable (fall band, side rails, etc.)  ??? Call light within reach  ??? Bed in low position/ wheels locked    _M___ Pain:       ??? Assess pain level and characteristics  ??? Administer analgesics as ordered  ??? Assess effectiveness of pain management and report to MD as needed    _M___ Knowledge Deficit:  ??? Assess baseline knowledge  ??? Provide teaching at level of understanding  ??? Provide teaching via preferred learning method  ??? Evaluate teaching effectiveness    _M___ Hemodynamic/Respiratory Status:       (Pre and Post Procedure Monitoring)  ??? Assess/Monitor vital signs and LOC  ??? Assess Baseline SpO2 prior to any sedation  ??? Obtain weight/height  ??? Assess vital signs/ LOC until patient meets discharge criteria  ??? Monitor procedure site and notify MD of any issues    _M___ Infection-Risk of Central Venous Catheter:   MIDLINE LT ARM  ??? Monitor for infection signs and symptoms (catheter site redness, temperature elevation, etc)  ??? Assess for infection risks  ??? Educate regarding infection prevention  ??? Manage central venous catheter (flushes/ dressing changes per protocol)

## 2015-12-10 ENCOUNTER — Inpatient Hospital Stay: Admit: 2015-12-10 | Attending: Infectious Disease | Primary: Family

## 2015-12-10 DIAGNOSIS — L03113 Cellulitis of right upper limb: Secondary | ICD-10-CM

## 2015-12-10 MED ORDER — NORMAL SALINE FLUSH 0.9 % IV SOLN
0.9 % | INTRAVENOUS | Status: AC | PRN
Start: 2015-12-10 — End: 2015-12-11
  Administered 2015-12-10 (×2): 10 mL via INTRAVENOUS

## 2015-12-10 MED ORDER — SODIUM CHLORIDE 0.9 % IV SOLN (MINI-BAG)
0.9 % | Freq: Once | INTRAVENOUS | Status: AC
Start: 2015-12-10 — End: 2015-12-10
  Administered 2015-12-10: 18:00:00 1000 g via INTRAVENOUS

## 2015-12-10 MED FILL — INVANZ 1 G IJ SOLR: 1 g | INTRAMUSCULAR | Qty: 1000

## 2015-12-10 NOTE — Progress Notes (Signed)
1335:  ARRIVES AMBULATORY FOR IV INVANZ.  MIDLINE NOTED LEFT ARM.  PT FAMILIAR WITH PROCESS AND PT RIGHTS AND RESPONSIBILITIES OFFERED TO PT.    1355:  EATING SNACK.  1425:  TOLERATED INFUSION WELL.  PT DISCHARGED AMBULATORY WITH INSTRUCTIONS.      _M___ Safety:       (Environmental)  ??? Orient to environment  ??? Ensure ID band is correct and in place/ allergy band as needed  ??? Assess for fall risk  ??? Initiate fall precautions as applicable (fall band, side rails, etc.)  ??? Call light within reach  ??? Bed in low position/ wheels locked    _M___ Pain:       ??? Assess pain level and characteristics  ??? Administer analgesics as ordered  ??? Assess effectiveness of pain management and report to MD as needed    _M___ Knowledge Deficit:  ??? Assess baseline knowledge  ??? Provide teaching at level of understanding  ??? Provide teaching via preferred learning method  ??? Evaluate teaching effectiveness    _M___ Hemodynamic/Respiratory Status:       (Pre and Post Procedure Monitoring)  ??? Assess/Monitor vital signs and LOC  ??? Assess Baseline SpO2 prior to any sedation  ??? Obtain weight/height  ??? Assess vital signs/ LOC until patient meets discharge criteria  ??? Monitor procedure site and notify MD of any issues    _M___ Infection-Risk of Central Venous Catheter:   MIDLINE  ??? Monitor for infection signs and symptoms (catheter site redness, temperature elevation, etc)  ??? Assess for infection risks  ??? Educate regarding infection prevention  ??? Manage central venous catheter (flushes/ dressing changes per protocol)

## 2015-12-11 ENCOUNTER — Inpatient Hospital Stay: Attending: Infectious Disease | Primary: Family

## 2015-12-11 DIAGNOSIS — L03113 Cellulitis of right upper limb: Secondary | ICD-10-CM

## 2015-12-11 MED ORDER — NORMAL SALINE FLUSH 0.9 % IV SOLN
0.9 % | INTRAVENOUS | Status: AC | PRN
Start: 2015-12-11 — End: 2015-12-12
  Administered 2015-12-11 (×2): 10 mL via INTRAVENOUS

## 2015-12-11 MED ORDER — ERTAPENEM SODIUM 1 G IJ SOLR
1 GM | Freq: Once | INTRAMUSCULAR | Status: AC
Start: 2015-12-11 — End: 2015-12-11
  Administered 2015-12-11: 18:00:00 1000 g via INTRAVENOUS

## 2015-12-11 MED FILL — INVANZ 1 G IJ SOLR: 1 g | INTRAMUSCULAR | Qty: 1000

## 2015-12-11 NOTE — Progress Notes (Signed)
_Met__ Safety   GOAL: Patient will not experience fall during visit.       (Environmental)  ??? Orient to environment  ??? Ensure ID band is correct and in place/ allergy band as needed  ??? Assess for fall risk  ??? Initiate fall precautions as applicable (fall band, side rails, etc.)  ??? Call light within reach  ??? Bed in low position/ wheels locked    __Met__ Pain   GOAL:   Patient will verbalize controlled pain.     ??? Assess pain level and characteristics  ??? Administer analgesics as ordered  ??? Assess effectiveness of pain management and report to MD as needed    __Met__ Knowledge Deficit:   GOAL:  Patient will verbalize understanding of therapy plan.   ??? Assess baseline knowledge  ??? Provide teaching at level of understanding  ??? Provide teaching via preferred learning method  ??? Evaluate teaching effectiveness    __Met__ Hemodynamic/Respiratory Status:   GOAL:  Vital signs remain within normal limits prior and post treatment.       (Pre and Post Procedure Monitoring)  ??? Assess/Monitor vital signs and LOC  ??? Assess Baseline SpO2 prior to any sedation  ??? Obtain weight/height  ??? Assess vital signs/ LOC until patient meets discharge criteria  ??? Monitor procedure site and notify MD of any issues    __Met__ Infection-Risk of Central Venous Catheter:   GOAL:  Prevent infection to PICC line.   ??? Monitor for infection signs and symptoms (catheter site redness, temperature elevation, etc)  ??? Assess for infection risks  ??? Educate regarding infection prevention  ??? Manage central venous catheter (flushes/ dressing changes per protocol)    CARE PLAN END DATE: 11-11-2015

## 2015-12-11 NOTE — Progress Notes (Signed)
Infusion completed. Patient tolerated well. No adverse reactions noted at this time. Vital signs remain within normal limits. Discharge instructions reviewed with patient. Patient verbalized understanding. Ambulated to private transportation at this time.

## 2015-12-11 NOTE — Progress Notes (Signed)
Patient ambulated to room for IV antibiotics. Denies concerns. Denies pain. Vital signs within normal limits.  Left arm Midline flushes with ease. Patient request Midline dressing change. Patient tolerated well. Dressing clean, dry, and intact at this time. Dressing to right hand changed, as ordered. No discharge, redness, or edema noted to incision site at this time.

## 2015-12-12 ENCOUNTER — Inpatient Hospital Stay: Admit: 2015-12-12 | Attending: Infectious Disease | Primary: Family

## 2015-12-12 DIAGNOSIS — L03113 Cellulitis of right upper limb: Secondary | ICD-10-CM

## 2015-12-12 MED ORDER — HEPARIN SOD (PORK) LOCK FLUSH 100 UNIT/ML IV SOLN
100 UNIT/ML | INTRAVENOUS | Status: AC | PRN
Start: 2015-12-12 — End: 2015-12-13
  Administered 2015-12-12: 19:00:00 500 [IU]

## 2015-12-12 MED ORDER — ERTAPENEM SODIUM 1 G IJ SOLR
1 GM | Freq: Once | INTRAMUSCULAR | Status: AC
Start: 2015-12-12 — End: 2015-12-12
  Administered 2015-12-12: 18:00:00 1000 g via INTRAVENOUS

## 2015-12-12 MED ORDER — NORMAL SALINE FLUSH 0.9 % IV SOLN
0.9 % | INTRAVENOUS | Status: AC | PRN
Start: 2015-12-12 — End: 2015-12-13

## 2015-12-12 MED ORDER — NORMAL SALINE FLUSH 0.9 % IV SOLN
0.9 % | INTRAVENOUS | Status: AC | PRN
Start: 2015-12-12 — End: 2015-12-13
  Administered 2015-12-12 (×2): 10 mL via INTRAVENOUS

## 2015-12-12 MED FILL — INVANZ 1 G IJ SOLR: 1 g | INTRAMUSCULAR | Qty: 1000

## 2015-12-12 MED FILL — HEPARIN LOCK FLUSH 100 UNIT/ML IV SOLN: 100 UNIT/ML | INTRAVENOUS | Qty: 6

## 2015-12-12 NOTE — Progress Notes (Signed)
Discharge assessment complete. No changes.  All discharge education and information given.

## 2015-12-12 NOTE — Progress Notes (Signed)
Dressing change to right hand removed.  Small amount of yellow drainage noted to old dressing.  Pt reports that it feels more sore than it has and that is looks more red that previously.  Wound cleaned with wound wash, tefla placed, kurlex and coban with webbing through fingers.  Pt tolerated well.

## 2015-12-12 NOTE — Progress Notes (Signed)
__met__ Safety   GOAL: Patient will have ID or Allergy band on in the Urgent Care       (Environmental)  ??? Orient to environment  ??? Ensure ID band is correct and in place/ allergy band as needed  ??? Assess for fall risk  ??? Initiate fall precautions as applicable (fall band, side rails, etc.)  ??? Call light within reach  ??? Bed in low position/ wheels locked    __met__ Pain   GOAL:  Patient pain will be under control while here in the Urgent Care     ??? Assess pain level and characteristics  ??? Administer analgesics as ordered  ??? Assess effectiveness of pain management and report to MD as needed    _met__ Knowledge Deficit:   GOAL: Patient will be educated on the medication they are receiving here in the Urgent Care  ??? Assess baseline knowledge  ??? Provide teaching at level of understanding  ??? Provide teaching via preferred learning method  ??? Evaluate teaching effectiveness    __met__ Hemodynamic/Respiratory Status:   GOAL: Patient vital signs will be assessed and monitored in the Urgent Care       (Pre and Post Procedure Monitoring)  ??? Assess/Monitor vital signs and LOC  ??? Assess Baseline SpO2 prior to any sedation  ??? Obtain weight/height  ??? Assess vital signs/ LOC until patient meets discharge criteria  ??? Monitor procedure site and notify MD of any issues    __met__ Infection-Risk of Central Venous Catheter:   GOAL: Patient will be monitored for infection while in the Urgent Care  ??? Monitor for infection signs and symptoms (catheter site redness, temperature elevation, etc)  ??? Assess for infection risks  ??? Educate regarding infection prevention  ??? Manage central venous catheter (flushes/ dressing changes per protocol)    CARE PLAN END DATE:

## 2015-12-13 ENCOUNTER — Inpatient Hospital Stay: Attending: Infectious Disease | Primary: Family

## 2015-12-13 DIAGNOSIS — L03113 Cellulitis of right upper limb: Secondary | ICD-10-CM

## 2015-12-13 LAB — GLOMERULAR FILTRATION RATE, ESTIMATED: Est, Glom Filt Rate: 76 mL/min/{1.73_m2} — AB

## 2015-12-13 LAB — CBC
Hematocrit: 42.9 % (ref 37.0–47.0)
Hemoglobin: 14.7 gm/dl (ref 12.0–16.0)
MCH: 29.2 pg (ref 27.0–31.0)
MCHC: 34.3 gm/dl (ref 33.0–37.0)
MCV: 85 fL (ref 81.0–99.0)
MPV: 9.6 mcm (ref 7.4–10.4)
Platelets: 242 10*3/uL (ref 130–400)
RBC: 5.05 10*6/uL (ref 4.20–5.40)
RDW: 14.1 % (ref 11.5–14.5)
WBC: 10.3 10*3/uL (ref 4.8–10.8)

## 2015-12-13 LAB — COMPREHENSIVE METABOLIC PANEL
ALT: 23 U/L (ref 11–66)
AST: 14 U/L (ref 5–40)
Albumin: 4.2 gm/dl (ref 3.5–5.1)
Alkaline Phosphatase: 153 U/L — ABNORMAL HIGH (ref 38–126)
BUN: 18 mg/dl (ref 7–22)
CO2: 24 meq/l (ref 23–33)
Calcium: 9.5 mg/dl (ref 8.5–10.5)
Chloride: 103 meq/l (ref 98–111)
Creatinine: 0.8 mg/dl (ref 0.4–1.2)
Glucose: 82 mg/dl (ref 70–108)
Potassium: 4.4 meq/l (ref 3.5–5.2)
Sodium: 141 meq/l (ref 135–145)
Total Bilirubin: 0.3 mg/dl (ref 0.3–1.2)
Total Protein: 7.3 gm/dl (ref 6.1–8.0)

## 2015-12-13 LAB — ANION GAP: Anion Gap: 14 meq/l (ref 8.0–16.0)

## 2015-12-13 MED ORDER — SODIUM CHLORIDE 0.9 % IV SOLN (MINI-BAG)
0.9 % | Freq: Once | INTRAVENOUS | Status: AC
Start: 2015-12-13 — End: 2015-12-13
  Administered 2015-12-13: 18:00:00 1000 g via INTRAVENOUS

## 2015-12-13 MED FILL — INVANZ 1 G IJ SOLR: 1 g | INTRAMUSCULAR | Qty: 1000

## 2015-12-13 NOTE — Progress Notes (Signed)
Patient being discharged via self in stable condition. Written and verbal instructions given to patient, she voices no questions are concerns.

## 2015-12-13 NOTE — Progress Notes (Signed)
Patients right hand wound dressing changed. Rinsed wound with saline, covered with a dry gauze and wrapped with Kerlix and coban. Patient tolerated well.

## 2015-12-13 NOTE — Progress Notes (Signed)
Patients infusion complete, she tolerated well.

## 2015-12-13 NOTE — Progress Notes (Signed)
Patient admitted to room C for an IV infusion, vitals are stable. Patient offered rights and responsibilities.

## 2015-12-13 NOTE — Progress Notes (Signed)
Patients Midline PICC pulled as per doctor order, PICC removal length was 20cm. Patient tolerated well.

## 2015-12-13 NOTE — Progress Notes (Signed)
___m_ Safety:       (Environmental)  ??? Orient to environment  ??? Ensure ID band is correct and in place/ allergy band as needed  ??? Assess for fall risk  ??? Initiate fall precautions as applicable (fall band, side rails, etc.)  ??? Call light within reach  ??? Bed in low position/ wheels locked    ____ Pain:       ??? Assess pain level and characteristics  ??? Administer analgesics as ordered  ??? Assess effectiveness of pain management and report to MD as needed    ____ Knowledge Deficit:  ??? Assess baseline knowledge  ??? Provide teaching at level of understanding  ??? Provide teaching via preferred learning method  ??? Evaluate teaching effectiveness    ____ Hemodynamic/Respiratory Status:       (Pre and Post Procedure Monitoring)  ??? Assess/Monitor vital signs and LOC  ??? Assess Baseline SpO2 prior to any sedation  ??? Obtain weight/height  ??? Assess vital signs/ LOC until patient meets discharge criteria  ??? Monitor procedure site and notify MD of any issues    ____ Infection-Risk of Central Venous Catheter:  ??? Monitor for infection signs and symptoms (catheter site redness, temperature elevation, etc)  ??? Assess for infection risks  ??? Educate regarding infection prevention  ??? Manage central venous catheter (flushes/ dressing changes per protocol)

## 2016-08-15 ENCOUNTER — Encounter: Attending: Nurse Practitioner | Primary: Family

## 2017-01-29 ENCOUNTER — Inpatient Hospital Stay
Admit: 2017-01-29 | Discharge: 2017-01-31 | Disposition: A | Discharge status: Home Health | Payer: Medicare Other | Source: Ambulatory Visit | Admitting: Urology

## 2017-01-29 ENCOUNTER — Emergency Department: Admit: 2017-01-29 | Payer: MEDICARE | Primary: Family

## 2017-01-29 DIAGNOSIS — N132 Hydronephrosis with renal and ureteral calculous obstruction: Secondary | ICD-10-CM

## 2017-01-29 LAB — URINE WITH REFLEXED MICRO
Bilirubin Urine: NEGATIVE
CASTS 2: NONE SEEN /lpf
Casts UA: NONE SEEN /lpf
Crystals, UA: NONE SEEN
Glucose, Ur: NEGATIVE mg/dl
Ketones, Urine: NEGATIVE
Leukocyte Esterase, Urine: NEGATIVE
MISCELLANEOUS 2: NONE SEEN
Nitrite, Urine: NEGATIVE
Protein, UA: NEGATIVE
Renal Epithelial, UA: NONE SEEN
Specific Gravity, Urine: 1.017 (ref 1.002–1.03)
Urobilinogen, Urine: 0.2 eu/dl (ref 0.0–1.0)
Yeast, UA: NONE SEEN
pH, UA: 5.5 (ref 5.0–9.0)

## 2017-01-29 LAB — COMPREHENSIVE METABOLIC PANEL W/ REFLEX TO MG FOR LOW K
ALT: 16 U/L (ref 11–66)
AST: 19 U/L (ref 5–40)
Albumin: 4.8 g/dL (ref 3.5–5.1)
Alkaline Phosphatase: 119 U/L (ref 38–126)
BUN: 19 mg/dL (ref 7–22)
CO2: 25 meq/L (ref 23–33)
Calcium: 9.4 mg/dL (ref 8.5–10.5)
Chloride: 103 meq/L (ref 98–111)
Creatinine: 1.2 mg/dL (ref 0.4–1.2)
Glucose: 141 mg/dL — ABNORMAL HIGH (ref 70–108)
Potassium reflex Magnesium: 4.1 meq/L (ref 3.5–5.2)
Sodium: 141 meq/L (ref 135–145)
Total Bilirubin: 0.4 mg/dL (ref 0.3–1.2)
Total Protein: 7.9 g/dL (ref 6.1–8.0)

## 2017-01-29 LAB — CBC WITH AUTO DIFFERENTIAL
Basophils Absolute: 0.1 10*3/uL (ref 0.0–0.1)
Basophils: 0.6 %
Eosinophils Absolute: 0.1 10*3/uL (ref 0.0–0.4)
Eosinophils: 0.4 %
Hematocrit: 48.8 % — ABNORMAL HIGH (ref 37.0–47.0)
Hemoglobin: 16.6 gm/dl — ABNORMAL HIGH (ref 12.0–16.0)
Lymphocytes Absolute: 1.3 10*3/uL (ref 1.0–4.8)
Lymphocytes: 7.4 %
MCH: 29.5 pg (ref 27.0–31.0)
MCHC: 34.1 gm/dl (ref 33.0–37.0)
MCV: 86.6 fL (ref 81.0–99.0)
MPV: 10.1 fL (ref 7.4–10.4)
Monocytes Absolute: 0.6 10*3/uL (ref 0.4–1.3)
Monocytes: 3.2 %
Platelets: 227 10*3/uL (ref 130–400)
RBC: 5.63 10*6/uL — ABNORMAL HIGH (ref 4.20–5.40)
RDW: 13.8 % (ref 11.5–14.5)
Seg Neutrophils: 88.4 %
Segs Absolute: 16 10*3/uL — ABNORMAL HIGH (ref 1.8–7.7)
WBC: 18.1 10*3/uL — ABNORMAL HIGH (ref 4.8–10.8)
nRBC: 0 /100 wbc

## 2017-01-29 LAB — LIPASE: Lipase: 14.6 U/L (ref 5.6–51.3)

## 2017-01-29 LAB — OSMOLALITY: Osmolality Calc: 285.9 mOsmol/kg (ref >=275.0)

## 2017-01-29 LAB — TROPONIN: Troponin T: <0.01 ng/ml

## 2017-01-29 LAB — GLOMERULAR FILTRATION RATE, ESTIMATED: Est, Glom Filt Rate: 47 mL/min/{1.73_m2} — AB

## 2017-01-29 LAB — LACTIC ACID: Lactic Acid: 0.7 mmol/L (ref 0.5–2.2)

## 2017-01-29 LAB — ANION GAP: Anion Gap: 13 meq/L (ref 8.0–16.0)

## 2017-01-29 MED ORDER — CLINDAMYCIN PHOSPHATE IN D5W 600 MG/50ML IV SOLN
600 MG/50ML | Freq: Once | INTRAVENOUS | Status: AC
Start: 2017-01-29 — End: 2017-01-29
  Administered 2017-01-30: 600 mg via INTRAVENOUS

## 2017-01-29 MED ORDER — SODIUM CHLORIDE 0.9 % IV BOLUS
0.9 % | Freq: Once | INTRAVENOUS | Status: AC
Start: 2017-01-29 — End: 2017-01-29
  Administered 2017-01-29: 22:00:00 1000 mL via INTRAVENOUS

## 2017-01-29 MED ORDER — DEXTROSE 5 % IV SOLN (MINI-BAG)
5 % | Freq: Once | INTRAVENOUS | Status: AC
Start: 2017-01-29 — End: 2017-01-29
  Administered 2017-01-29: 23:00:00 1 g via INTRAVENOUS

## 2017-01-29 MED ORDER — FENTANYL CITRATE (PF) 100 MCG/2ML IJ SOLN
100 MCG/2ML | INTRAMUSCULAR | Status: AC | PRN
Start: 2017-01-29 — End: 2017-01-29
  Administered 2017-01-29 (×2): 100 ug via INTRAVENOUS

## 2017-01-29 MED ORDER — ONDANSETRON HCL 4 MG/2ML IJ SOLN
4 MG/2ML | INTRAMUSCULAR | Status: DC | PRN
Start: 2017-01-29 — End: 2017-01-29
  Administered 2017-01-29: 22:00:00 4 mg via INTRAVENOUS

## 2017-01-29 MED FILL — DEXTROSE 5 % IV SOLN: 5 % | INTRAVENOUS | Qty: 50

## 2017-01-29 MED FILL — FENTANYL CITRATE (PF) 100 MCG/2ML IJ SOLN: 100 MCG/2ML | INTRAMUSCULAR | Qty: 2

## 2017-01-29 MED FILL — CLEOCIN IN D5W 600 MG/50ML IV SOLN: 600 MG/50ML | INTRAVENOUS | Qty: 50

## 2017-01-29 MED FILL — ONDANSETRON HCL 4 MG/2ML IJ SOLN: 4 MG/2ML | INTRAMUSCULAR | Qty: 2

## 2017-01-29 MED FILL — CEFTRIAXONE SODIUM 1 G IJ SOLR: 1 g | INTRAMUSCULAR | Qty: 1

## 2017-01-29 NOTE — ED Notes (Signed)
Pt screaming out of room at staff asking for pain medication. Physician notified.     Lurlean Hornsristan Mahiya Kercheval, RN  01/29/17 2101

## 2017-01-29 NOTE — ED Notes (Signed)
Bed: 020A  Expected date: 01/29/17  Expected time: 4:34 PM  [facility]  Comments:     Cristopher PeruScott J Alizah Sills, RN  01/29/17 [phone]

## 2017-01-29 NOTE — ED Provider Notes (Addendum)
[facility]  eMERGENCY dEPARTMENT eNCOUnter          CHIEF COMPLAINT       Chief Complaint   Patient presents with   . Abdominal Pain     LLQ       Nurses Notes reviewed and I agree except as noted in the HPI.    HISTORY OF PRESENT ILLNESS    Theresa Hobbs is a 51 y.o. female who presents to the Emergency Department for the evaluation of left flank / abdominal pain.     Patient has a history of Kidney cancer, CKD, Seizures, Anxiety, Diabetes, Hypertension, Asthma, Cholecystectomy, Hernia repair, and Hysterectomy.     Patient states symptoms of abdominal pain started 2 hours ago suddenly when she was sitting outside. She describes pain as being constant and sharp and rates it a 10/10 in severity. She states she has never had pain like this before. She denies any alcohol use. She states she does smoke mariajuana but denies smoking any today. She states she cannot sit or lay still because the pain is severe.     She denies any chest pain, shortness of breath, nausea, emesis, hematuria, hematochezia, diarrhea, fever, or chills. She has no dysuria/urgency or other GU symptoms. She has no fever and no constitutional symptoms at this time. She has never had kidney stones before.  Patient has no other complaints at this time.    Patient appears to be in a lot of pain and she is pacing the room for the pain and she is uncooperative with the exam and history because  "she is in a lot of pain".  Clinically she is tender on the LLQ but no guarding or rigidity.      The HPI was provided by the patient.      REVIEW OF SYSTEMS     Review of Systems   Constitutional: Negative for chills, fever and unexpected weight change.   HENT: Negative for congestion, ear pain and sore throat.    Eyes: Negative for visual disturbance.   Respiratory: Negative for cough, shortness of breath and wheezing.    Cardiovascular: Negative for chest pain, palpitations and leg swelling.   Gastrointestinal: Positive for abdominal pain.  Negative for blood in stool, diarrhea, nausea and vomiting.   Genitourinary: Negative for dysuria and hematuria.   Musculoskeletal: Negative for arthralgias and back pain.   Skin: Negative for rash.   Allergic/Immunologic: Negative for environmental allergies.   Neurological: Negative for dizziness, syncope, weakness, numbness and headaches.   Hematological: Does not bruise/bleed easily.   Psychiatric/Behavioral: Negative for dysphoric mood. The patient is not nervous/anxious.    All other systems reviewed and are negative.      PAST MEDICAL HISTORY    has a past medical history of Anxiety; Asthma; Cancer (HCC); Chronic kidney disease; Depression; Diabetes mellitus (HCC); Fibromyalgia; HTN (hypertension); Lupus; MRSA (methicillin resistant Staphylococcus aureus); Post traumatic stress disorder (PTSD); Psychiatric problem; and Seizures (HCC).    SURGICAL HISTORY      has a past surgical history that includes Cholecystectomy (2000); Nose surgery (2007); Hysterectomy (1998); Colonoscopy (2004); Upper gastrointestinal endoscopy (2004); Abdominal hernia repair (02-07-13); and other surgical history (Right, 11/26/2015).    CURRENT MEDICATIONS       Previous Medications    ESTROGENS, CONJUGATED, (PREMARIN) 0.9 MG TABLET    Take 1 tablet by mouth daily    HYDROXYZINE (VISTARIL) 50 MG CAPSULE    Take 50 mg by mouth 3 times daily  as needed for Itching    LEVETIRACETAM (KEPPRA PO)    Take 500 mg by mouth 2 times daily.    METFORMIN (GLUCOPHAGE) 500 MG TABLET    Take 500 mg by mouth 2 times daily (with meals).    OMEPRAZOLE (PRILOSEC) 40 MG DELAYED RELEASE CAPSULE    Take 40 mg by mouth daily    PHENYTOIN (DILANTIN) 100 MG ER CAPSULE    Take 100 mg by mouth 3 times daily. May increase up to 5 100 mg tablets if needed (depending on dilantin level)    PRAMIPEXOLE (MIRAPEX) 0.25 MG TABLET    Take 1 tablet by mouth nightly    TRAZODONE (DESYREL) 100 MG TABLET    Take 200 mg by mouth nightly    VENLAFAXINE (EFFEXOR-XR) 150 MG XR  CAPSULE    Take 150 mg by mouth daily.       ALLERGIES     is allergic to imitrex [sumatriptan]; iv dye [iodides]; amoxicillin-pot clavulanate; azithromycin; bactrim; cefuroxime axetil; ciprofloxacin; erythromycin; flagyl [metronidazole]; ketorolac tromethamine; klonopin [clonazepam]; levofloxacin; nubain [nalbuphine hcl]; other; pcn [penicillins]; reglan [metoclopramide]; sulfa antibiotics; and tramadol.    FAMILY HISTORY     is adopted.    family history includes Cancer in her father and mother; Osteoporosis in her mother. She was adopted.    SOCIAL HISTORY      reports that she has been smoking Cigarettes.  She has a 0.60 pack-year smoking history. She has never used smokeless tobacco. She reports that she uses drugs, including Marijuana. She reports that she does not drink alcohol.    PHYSICAL EXAM     INITIAL VITALS:  oral temperature is 97.9 F (36.6 C). Her blood pressure is 179/91 (abnormal) and her pulse is 73. Her respiration is 23 and oxygen saturation is 93%.    Physical Exam   Constitutional: She is oriented to person, place, and time. She appears well-developed and well-nourished. No distress.   HENT:   Head: Normocephalic and atraumatic.   Right Ear: External ear normal.   Left Ear: External ear normal.   Nose: Nose normal.   Mouth/Throat: Oropharynx is clear and moist. No oropharyngeal exudate.   Eyes: Conjunctivae and EOM are normal. Pupils are equal, round, and reactive to light. Right eye exhibits no discharge. Left eye exhibits no discharge. No scleral icterus.   Neck: Normal range of motion. Neck supple. No JVD present. No tracheal deviation present.   Cardiovascular: Normal rate, regular rhythm, normal heart sounds and intact distal pulses.  Exam reveals no gallop and no friction rub.    No murmur heard.  Pulmonary/Chest: Effort normal and breath sounds normal. She has no wheezes. She has no rales.   Abdominal: Soft. Bowel sounds are normal. She exhibits no mass. There is tenderness in the  left lower quadrant. There is no rigidity, no rebound and no guarding.   Musculoskeletal: Normal range of motion. She exhibits no edema or tenderness.   Lymphadenopathy:     She has no cervical adenopathy.   Neurological: She is alert and oriented to person, place, and time. She has normal reflexes. No cranial nerve deficit. She exhibits normal muscle tone. Coordination normal.   Skin: Skin is warm and dry. No rash noted. She is not diaphoretic.   Psychiatric: She has a normal mood and affect. Her behavior is normal. Judgment and thought content normal.   Nursing note and vitals reviewed.      DIFFERENTIAL DIAGNOSIS:   Kidney stone, Aortic dissection  DIAGNOSTIC RESULTS     EKG: All EKG's are interpreted by the Emergency Department Physician who either signs or Co-signs this chart in the absence of a cardiologist.    RADIOLOGY: non-plain film images(s) such as CT, Ultrasound and MRI are read by the radiologist.    CT ABDOMEN PELVIS WO CONTRAST Additional Contrast? None   Final Result   2 mm left ureterovesical junction calculus with hydroureter ureteronephrosis and prominent perinephric edema. Possible second ureteral calculus several centimeters more proximal but this is not a definitive finding.            **This report has been created using voice recognition software.  It may contain minor errors which are inherent in voice recognition technology.**      Final report electronically signed by Dr. Avel SensorAndrew Cook on 01/29/2017 6:08 PM          LABS:   Labs Reviewed   CBC WITH AUTO DIFFERENTIAL - Abnormal; Notable for the following:        Result Value    WBC 18.1 (*)     RBC 5.63 (*)     Hemoglobin 16.6 (*)     Hematocrit 48.8 (*)     Segs Absolute 16.0 (*)     All other components within normal limits   COMPREHENSIVE METABOLIC PANEL W/ REFLEX TO MG FOR LOW K - Abnormal; Notable for the following:     Glucose 141 (*)     All other components within normal limits   URINE WITH REFLEXED MICRO - Abnormal; Notable for  the following:     Blood, Urine MODERATE (*)     All other components within normal limits   GLOMERULAR FILTRATION RATE, ESTIMATED - Abnormal; Notable for the following:     Est, Glom Filt Rate 47 (*)     All other components within normal limits   TROPONIN   LIPASE   LACTIC ACID, PLASMA   ANION GAP   OSMOLALITY       EMERGENCY DEPARTMENT COURSE:   Vitals:    Vitals:    01/29/17 1649 01/29/17 1707 01/29/17 1841   BP: (!) 188/118 (!) 177/104 (!) 179/91   Pulse: 83 95 73   Resp: 24 24 23    Temp: 97.9 F (36.6 C)     TempSrc: Oral     SpO2: 97% 96% 93%     MDM    The patient presented to the emergency department with left flank pain of sudden onset.  She is found to have a white cell count of 18.1.  Her chemistries are good.  CT scan shows 2 mm left ureterovesical junction calculus with hydronephrosis and prominent perinephric edema.  She'll be admitted for pain control and with an elevated white cell count we'll cover with antibiotics.    CONSULTS:    640PM I spoke to the mid-level provider covering for Dr. Harle StanfordNicholson [facility] who kindly agreed to admit the patient.  She requested the patient be seen by medicine for co- management.    640PM I spoke to hospitalist Dr. Arman Bogusivi  who kindly agreed to consult on the patient.      FINAL IMPRESSION      1. Left lower quadrant pain    2. Hydronephrosis with urinary obstruction due to renal calculus          DISPOSITION/PLAN     Patient is admitted.    PATIENT REFERRED TO:  No follow-up provider specified.    DISCHARGE MEDICATIONS:  New Prescriptions  No medications on file       (Please note that portions of this note were completed with a voice recognition program.  Efforts were made to edit the dictations but occasionally words are mis-transcribed.)    Scribe:  Laureen Abrahams 01/29/17 5:19 PM Scribing for and in the presence of Joanna Puff, MD.    Signed: Scribe, Laureen Abrahams 01/29/17 5:19 PM    Provider:  I personally performed the services described in the documentation,  reviewed and edited the documentation which was dictated to the scribe in my presence, and it accurately records my words and actions.    Joanna Puff, MD 01/29/17 6:45 PM            Marquis Lunch, MD  01/29/17 1610       Marquis Lunch, MD  01/30/17 [phone]

## 2017-01-29 NOTE — ED Notes (Signed)
Patient transported to 5K21 via cart in stable condition.     Contact made with floor prior to transport        Tobey GrimBrendan C Kalei Mckillop, EMT-P  01/29/17 2013

## 2017-01-29 NOTE — ED Notes (Signed)
Pt given last dose of pain medication in order to transport to floor. Pt informed that this is the last dose of pain medication that she is able to receive here in the ED. Pt stated that she will try to calm herself to transfer. Physician aware and has spoken with pt as well.     Lurlean Hornsristan Oreoluwa Aigner, RN  01/29/17 2106

## 2017-01-29 NOTE — ED Notes (Signed)
Pt given one more round of pain medication at this time in order to transport to floor. Pt aware that this is the last dose of pain medication she is able to receive in the ED at this time. Pt agreed to try and calm herself in order to transport to the floor. Physician spoke with pt and is aware of situation.      Lurlean Hornsristan Rosezetta Balderston, RN  01/29/17 2103

## 2017-01-29 NOTE — Consults (Signed)
Consult    Patient:  Theresa RankinsJo A Jaso  Date of Birth: 06/18/1966  Date of Service: 01/29/2017  MRN: [id number]   Acct:  [id number]   Primary Care Physician: No primary care provider on file.        History of Present Illness:   History obtained from chart review and the patient.    The patient is a 51 y.o. female with Anxiety, Asthma, Lupus, h/o Kidney Cancer, Hyperglycemia whom I have been requested to see by Dr Harle StanfordNicholson for elevated blood pressure and pain  management. Patient presented to the ED with complaint of left flank pain and abdominal pain ~ 2hrs prior to ED arrival. CT abdomen pelvis showed 2 mm left ureterovesical junction calculus with hydroureter ureteronephrosis and prominent perinephric edema. Possible second ureteral calculus several centimeters more proximal but this is not a definitive finding. Patient rates her left flank pain a 10/10 in severity and this has not responded to 3 doses of Fentanyl 100 mcg given in the ED. She also received Morphine 4 mg on the medical floor and states that it was not helping her pain. Patient did have episodes of emesis while in the ED. She currently has IV hydration infusing. She is experiencing colicky left sided abdominal and flank pain.      Past Medical History:        Diagnosis Date   . Anxiety    . Asthma    . Cancer (HCC)     kidney    . Chronic kidney disease    . Depression    . Diabetes mellitus (HCC)     type 2    . Fibromyalgia    . HTN (hypertension)    . Lupus    . MRSA (methicillin resistant Staphylococcus aureus)    . Post traumatic stress disorder (PTSD)    . Psychiatric problem    . Seizures (HCC)     Pt voiced she has seizures.       Past Surgical History:        Procedure Laterality Date   . ABDOMINAL HERNIA REPAIR  02-07-13    [facility]- Repair Trocar Site Hernia  2. Repair Umbilical Hernia  3. Repair  [facility]   . [place]LE[place]Y  2000    [facility]    . COLONOSCOPY  [facility]    [facility]    . HYSTERECTOMY  1998    [facility]    . NOSE SURGERY  2007     polyp removed-[facility]    . OTHER SURGICAL HISTORY Right 11/26/2015    Right Hand Invcision and Drainage by Dr Dellie CatholicMuha   . UPPER GASTROINTESTINAL ENDOSCOPY  2004    Dr. [facility]        Home Medications:   No current facility-administered medications on file prior to encounter.      Current Outpatient Prescriptions on File Prior to Encounter   Medication Sig Dispense Refill   . estrogens, conjugated, (PREMARIN) 0.9 MG tablet Take 1 tablet by mouth daily 21 tablet 3   . pramipexole (MIRAPEX) 0.25 MG tablet Take 1 tablet by mouth nightly 90 tablet 3   . omeprazole (PRILOSEC) 40 MG delayed release capsule Take 40 mg by mouth daily     . traZODone (DESYREL) 100 MG tablet Take 200 mg by mouth nightly     . hydrOXYzine (VISTARIL) 50 MG capsule Take 50 mg by mouth 3 times daily as needed for Itching     . metFORMIN (GLUCOPHAGE) 500 MG tablet Take 500  mg by mouth 2 times daily (with meals).     . LevETIRAcetam (KEPPRA PO) Take 500 mg by mouth 2 times daily.     Marland Kitchen venlafaxine (EFFEXOR-XR) 150 MG XR capsule Take 150 mg by mouth daily.     . phenytoin (DILANTIN) 100 MG ER capsule Take 100 mg by mouth 3 times daily. May increase up to 5 100 mg tablets if needed (depending on dilantin level)         Allergies:  Imitrex [sumatriptan]; Iv dye [iodides]; Amoxicillin-pot clavulanate; Azithromycin; Bactrim; Cefuroxime axetil; Ciprofloxacin; Erythromycin; Flagyl [metronidazole]; Ketorolac tromethamine; Klonopin [clonazepam]; Levofloxacin; Nubain [nalbuphine hcl]; Other; Pcn [penicillins]; Reglan [metoclopramide]; Sulfa antibiotics; and Tramadol    Social History:    reports that she has been smoking Cigarettes.  She has a 0.60 pack-year smoking history. She has never used smokeless tobacco. She reports that she uses drugs, including Marijuana. She reports that she does not drink alcohol.    Family History:       Problem Relation Age of Onset   . Adopted: Yes   . Cancer Father      pancreatic    . Cancer Mother      uterine and ovarian    .  Osteoporosis Mother        Review of systems:  Constitutional: no fever, no night sweats, no fatigue  Head: no headache, no head injury, no migranes.  Eye: no blurring of vision, no double vision.  Ears: no hearing difficulty, no tinnitus  Mouth/throat: no ulceration, dental caries, dysphagia  Lungs: no cough, no shortness of breath, no wheeze  CVS: no palpitation, no chest pain, no shortness of breath  GI: + abdominal pain, + left flank pain, + nausea , + vomiting, no constipation  GUS: no dysuria, frequency and urgency, no hematuria, no kidney stones  Musculoskeletal: no joint pain, swelling , stiffness  Endocrine: no polyuria, polydypsia, no cold or heat intolerence  Hematology: no anemia, no easy brusing or bleeding, no hx of clotting disorder  Dermatology: no skin rash, no eczema, no prurities,  Psychiatry: no depression, no anxiety,no panic attacks, no suicide ideation  Neurology: no syncope, no seizures, no numbness or tingling of hands, no numbness or tingling of feet, no paresis    10 point review of systems completed, all other than noted above are negative.       Vitals:   Vitals:    01/29/17 2130   BP: (!) 140/71   Pulse: 115   Resp: 20   Temp: 98.4 F (36.9 C)   SpO2: 93%      BMI: There is no height or weight on file to calculate BMI.                Exam:  Physical Examination: General appearance - alert, ill appearing, and in some distress  Mental status - alert, oriented to person, place, and time  Neck - supple, no significant adenopathy, no JVD, or carotid bruits  Chest - clear to auscultation, no wheezes, rales or rhonchi, symmetric air entry  Heart - normal rate, regular rhythm, normal S1, S2, no murmurs, rubs, clicks or gallops  Abdomen - soft, + left sided abdominal tenderness, + left flank pain, nondistended, obese abdomen, no masses or organomegaly  Neurological - alert, oriented, normal speech, no focal findings or movement disorder noted  Musculoskeletal - no joint tenderness, deformity or  swelling  Extremities - peripheral pulses normal, no pedal edema, no clubbing or cyanosis  Skin -  normal coloration and turgor, no rashes, no suspicious skin lesions noted      Review of Labs and Diagnostic Testing:    Recent Results (from the past 24 hour(s))   Urine with Reflexed Micro    Collection Time: 01/29/17  4:43 PM   Result Value Ref Range    Glucose, Ur NEGATIVE NEGATIVE mg/dl    Bilirubin Urine NEGATIVE NEGATIVE    Ketones, Urine NEGATIVE NEGATIVE    Specific Gravity, Urine 1.017 1.002 - 1.03    Blood, Urine MODERATE (A) NEGATIVE    pH, UA 5.5 5.0 - 9.0    Protein, UA NEGATIVE NEGATIVE    Urobilinogen, Urine 0.2 0.0 - 1.0 eu/dl    Nitrite, Urine NEGATIVE NEGATIVE    Leukocyte Esterase, Urine NEGATIVE NEGATIVE    Color, UA YELLOW STRAW-YELL    Character, Urine CLEAR CLEAR-SL C    RBC, UA 10-15 0-2/hpf /hpf    WBC, UA 2-4 0-4/hpf /hpf    Epi Cells 0-2 3-5/hpf /hpf    Bacteria, UA NONE FEW/NONE S /hpf    Casts UA NONE SEEN NONE SEEN /lpf    Crystals NONE SEEN NONE SEEN    Renal Epithelial, Urine NONE SEEN NONE SEEN    Yeast, UA NONE SEEN NONE SEEN    CASTS 2 NONE SEEN NONE SEEN /lpf    MISCELLANEOUS 2 NONE SEEN    CBC auto differential    Collection Time: 01/29/17  6:14 PM   Result Value Ref Range    WBC 18.1 (H) 4.8 - 10.8 thou/mm3    RBC 5.63 (H) 4.20 - 5.40 mill/mm3    Hemoglobin 16.6 (H) 12.0 - 16.0 gm/dl    Hematocrit 16.1 (H) 37.0 - 47.0 %    MCV 86.6 81.0 - 99.0 fL    MCH 29.5 27.0 - 31.0 pg    MCHC 34.1 33.0 - 37.0 gm/dl    RDW 09.6 04.5 - 40.9 %    Platelets 227 130 - 400 thou/mm3    MPV 10.1 7.4 - 10.4 fL    Seg Neutrophils 88.4 %    Lymphocytes 7.4 %    Monocytes 3.2 %    Eosinophils 0.4 %    Basophils 0.6 %    nRBC 0 /100 wbc    Segs Absolute 16.0 (H) 1.8 - 7.7 thou/mm3    Lymphocytes # 1.3 1.0 - 4.8 thou/mm3    Monocytes # 0.6 0.4 - 1.3 thou/mm3    Eosinophils # 0.1 0.0 - 0.4 thou/mm3    Basophils # 0.1 0.0 - 0.1 thou/mm3   Comprehensive Metabolic Panel w/ Reflex to MG    Collection Time:  01/29/17  6:14 PM   Result Value Ref Range    Glucose 141 (H) 70 - 108 mg/dL    CREATININE 1.2 0.4 - 1.2 mg/dL    BUN 19 7 - 22 mg/dL    Sodium 811 914 - 782 meq/L    Potassium reflex Magnesium 4.1 3.5 - 5.2 meq/L    Chloride 103 98 - 111 meq/L    CO2 25 23 - 33 meq/L    Calcium 9.4 8.5 - 10.5 mg/dL    AST 19 5 - 40 U/L    Alkaline Phosphatase 119 38 - 126 U/L    Total Protein 7.9 6.1 - 8.0 g/dL    Alb 4.8 3.5 - 5.1 g/dL    Total Bilirubin 0.4 0.3 - 1.2 mg/dL    ALT 16 11 - 66 U/L   Troponin  Collection Time: 01/29/17  6:14 PM   Result Value Ref Range    Troponin T < 0.010 ng/ml   Lipase    Collection Time: 01/29/17  6:14 PM   Result Value Ref Range    Lipase 14.6 5.6 - 51.3 U/L   Lactic acid, plasma    Collection Time: 01/29/17  6:14 PM   Result Value Ref Range    Lactic Acid 0.7 0.5 - 2.2 mmol/L   Anion Gap    Collection Time: 01/29/17  6:14 PM   Result Value Ref Range    Anion Gap 13.0 8.0 - 16.0 meq/L   Glomerular Filtration Rate, Estimated    Collection Time: 01/29/17  6:14 PM   Result Value Ref Range    Est, Glom Filt Rate 47 (A) ml/min/1.72m2   Osmolality    Collection Time: 01/29/17  6:14 PM   Result Value Ref Range    Osmolality Calc 285.9 275.0 - 300 mOsmol/kg       Radiology:     Ct Abdomen Pelvis Wo Contrast Additional Contrast? None    Result Date: 01/29/2017  PROCEDURE: CT ABDOMEN PELVIS WO CONTRAST CLINICAL INFORMATION: LLQ abdominal pain/if the stone is negative do CT WITH contrast for r/o Aortic dissection . COMPARISON: No prior study. TECHNIQUE: Noncontrast images at 5 mm intervals with multiplanar reconstructions All CT scans at this facility use dose modulation, iterative reconstruction, and/or weight-based dosing when appropriate to reduce radiation dose to as low as reasonably achievable. FINDINGS: Lung bases Lung bases are clear. Undersurface the heart unremarkable. Abdomen pelvis Solid organ evaluation is limited without IV contrast. The left kidney is edematous. There is perinephric edema.  There is mild hydronephrosis. There is a stone at the left ureterovesical junction measuring 2 mm. There is possibly a more proximal stone measuring 3 mm but this may represent a phlebolith. Definite fluid attenuation does not surround this more proximal stone and the patient does have multiple phleboliths in the pelvis. Right kidney shows no hydronephrosis or stones. Right adrenal gland is normal. Left adrenal gland has a nodular appearance left limb of the left adrenal gland shows a 6.5 mm nodular appearance Hounsfield units are consistent with the lipid rich adenoma. Liver and spleen are unremarkable. Dilated common bile duct postcholecystectomy patient. Pancreas is normal. No abnormal fluid collections within the pelvis. No bowel obstruction. Normal appendix.     2 mm left ureterovesical junction calculus with hydroureter ureteronephrosis and prominent perinephric edema. Possible second ureteral calculus several centimeters more proximal but this is not a definitive finding. **This report has been created using voice recognition software.  It may contain minor errors which are inherent in voice recognition technology.** Final report electronically signed by Dr. Avel Sensor on 01/29/2017 6:08 PM        EKG: Sinus tachycardia    Assessment / Plan:    1.   Hydronephrosis due to obstruction of ureter - IV hydration, Rocephin, Morphine, Ativan prn colicky spasms, Urology following  2.   Nephrolithiasis - Urology following  3.   Tobacco abuse- cessation counseling, Nicotine patch  4.   Tachycardia, poss d/t #1, 2- troponin, EKG, lactic acid, lipase,         Dispo: Patient seen and examined              Medications, Xrays and labs reviewed              EKG shows tachycardia  Patient is stable                 Thank you Dr Harle Stanford for allowing me to participate in this patient's care.    DVT prophylaxis: []  Lovenox                                 [x]  SCDs                                 []  SQ Heparin                                  []  Encourage ambulation, low risk for DVT, no chemical or mechanical prophylaxis necessary              []  Already on Anticoagulation                Anticipated Disposition upon discharge: [x]  Home                                                                         []  Home with Home Health                                                                         []  Skilled Nursing Facility                                                                         []  [facility]          Electronically signed by Sharman Crate, DO on 01/29/2017 at 9:46 PM

## 2017-01-29 NOTE — ED Notes (Signed)
Went into room to transport pt up to floor, pt pulled IV out. Antibiotics and fluids running onto the floor. New IV inserted and running. Pt screaming out for more pain medications stating that she would not go anywhere without more pain medication.      Lurlean Hornsristan Azyriah Nevins, RN  01/29/17 [phone]

## 2017-01-29 NOTE — ED Notes (Signed)
Pt requesting Phenergan for nausea instead of Zofran.      Harlow MaresHarvest D Lanisa Ishler, RN  01/29/17 1858

## 2017-01-29 NOTE — ED Notes (Addendum)
Pt refusing to use the restroom or a bedpan at this time. Pt stood on side of bed and urinated onto floor. Pt refused catheter at this time. Physician notified.     Lurlean Hornsristan Flo Berroa, RN  01/29/17 [phone]

## 2017-01-29 NOTE — ED Notes (Signed)
Pt stood on side of bed and urinated onto floor again. Pt refusing to use any other measures to toilet. Physician notified.     Lurlean Hornsristan Aliayah Tyer, RN  01/29/17 [phone]

## 2017-01-29 NOTE — ED Triage Notes (Signed)
Pt presents to ED via triage with c/o LLQ abdominal pain. Pt has a history of a hysterectomy. Pt denies constipation or diarrhea. Pt denies painful urination or frequency. Pt denies a history of kidney stones. Pt reports that she was grilling when all of a sudden she experienced this sharp pain. Pt denies lifting anything heavy. EMS gave Fentanyl via nasal route, due to inability to start IV. Pt rolling around on cot, difficult to get vitals, unable to get EKG at this time. IV inserted. Urine specimen sent to lab.

## 2017-01-29 NOTE — ED Notes (Signed)
Primary RN notified of pt request for Phenergan.      Harlow MaresHarvest D Kaladin Noseworthy, RN  01/29/17 [phone]

## 2017-01-29 NOTE — ED Notes (Signed)
Pt screaming out in pain, constantly hitting call light to have someone get her physician for more pain medications. Physician notified.     Lurlean Hornsristan Jakavion Bilodeau, RN  01/29/17 2100

## 2017-01-29 NOTE — Procedures (Signed)
EKG was handed to Jordan RN.

## 2017-01-29 NOTE — ED Notes (Signed)
Pt given pain medications at this time. Pt to radiology at this time.     Lurlean Hornsristan Lin Hackmann, RN  01/29/17 1750

## 2017-01-30 DIAGNOSIS — N132 Hydronephrosis with renal and ureteral calculous obstruction: Secondary | ICD-10-CM

## 2017-01-30 LAB — EKG 12-LEAD
Atrial Rate: 108 {beats}/min
P Axis: 60 degrees
P-R Interval: 122 ms
Q-T Interval: 318 ms
QRS Duration: 70 ms
QTc Calculation (Bazett): 426 ms
R Axis: 33 degrees
T Axis: 70 degrees
Ventricular Rate: 108 {beats}/min

## 2017-01-30 LAB — CBC WITH AUTO DIFFERENTIAL
Basophils Absolute: 0.1 10*3/uL (ref 0.0–0.1)
Basophils: 0.6 %
Eosinophils Absolute: 0.2 10*3/uL (ref 0.0–0.4)
Eosinophils: 2.1 %
Hematocrit: 43.6 % (ref 37.0–47.0)
Hemoglobin: 14.8 gm/dl (ref 12.0–16.0)
Lymphocytes Absolute: 3.1 10*3/uL (ref 1.0–4.8)
Lymphocytes: 27.5 %
MCH: 29.5 pg (ref 27.0–31.0)
MCHC: 33.9 gm/dl (ref 33.0–37.0)
MCV: 86.9 fL (ref 81.0–99.0)
MPV: 9.9 fL (ref 7.4–10.4)
Monocytes Absolute: 1.1 10*3/uL (ref 0.4–1.3)
Monocytes: 10 %
Platelets: 202 10*3/uL (ref 130–400)
RBC: 5.01 10*6/uL (ref 4.20–5.40)
RDW: 13.9 % (ref 11.5–14.5)
Seg Neutrophils: 59.8 %
Segs Absolute: 6.6 10*3/uL (ref 1.8–7.7)
WBC: 11.1 10*3/uL — ABNORMAL HIGH (ref 4.8–10.8)
nRBC: 0 /100 wbc

## 2017-01-30 LAB — BASIC METABOLIC PANEL W/ REFLEX TO MG FOR LOW K
BUN: 15 mg/dL (ref 7–22)
CO2: 23 meq/L (ref 23–33)
Calcium: 8.7 mg/dL (ref 8.5–10.5)
Chloride: 108 meq/L (ref 98–111)
Creatinine: 0.9 mg/dL (ref 0.4–1.2)
Glucose: 114 mg/dL — ABNORMAL HIGH (ref 70–108)
Potassium reflex Magnesium: 4.3 meq/L (ref 3.5–5.2)
Sodium: 143 meq/L (ref 135–145)

## 2017-01-30 LAB — PROCALCITONIN: Procalcitonin: 0.03 ng/mL (ref 0.01–0.09)

## 2017-01-30 LAB — ANION GAP: Anion Gap: 12 meq/L (ref 8.0–16.0)

## 2017-01-30 LAB — GLOMERULAR FILTRATION RATE, ESTIMATED: Est, Glom Filt Rate: 66 mL/min/{1.73_m2} — AB

## 2017-01-30 MED ORDER — ONDANSETRON HCL 4 MG/2ML IJ SOLN
4 MG/2ML | INTRAMUSCULAR | Status: DC | PRN
Start: 2017-01-30 — End: 2017-01-30
  Administered 2017-01-30: 22:00:00 4 via INTRAVENOUS

## 2017-01-30 MED ORDER — OXYCODONE-ACETAMINOPHEN 5-325 MG PO TABS
5-325 MG | Freq: Four times a day (QID) | ORAL | Status: DC | PRN
Start: 2017-01-30 — End: 2017-01-30

## 2017-01-30 MED ORDER — HYDROMORPHONE HCL 1 MG/ML IJ SOLN
1 MG/ML | INTRAMUSCULAR | Status: DC | PRN
Start: 2017-01-30 — End: 2017-01-30

## 2017-01-30 MED ORDER — LIDOCAINE HCL (CARDIAC) 20 MG/ML IV SOLN
20 | INTRAVENOUS | Status: AC
Start: 2017-01-30 — End: 2017-01-30

## 2017-01-30 MED ORDER — MORPHINE SULFATE 2 MG/ML IJ SOLN
2 MG/ML | INTRAMUSCULAR | Status: DC | PRN
Start: 2017-01-30 — End: 2017-01-30

## 2017-01-30 MED ORDER — ALBUTEROL SULFATE HFA 108 (90 BASE) MCG/ACT IN AERS
108 (90 Base) MCG/ACT | Freq: Four times a day (QID) | RESPIRATORY_TRACT | Status: DC | PRN
Start: 2017-01-30 — End: 2017-01-31
  Administered 2017-01-30 – 2017-01-31 (×3): 2 via RESPIRATORY_TRACT

## 2017-01-30 MED ORDER — PROPOFOL 200 MG/20ML IV EMUL
200 | INTRAVENOUS | Status: AC
Start: 2017-01-30 — End: 2017-01-30

## 2017-01-30 MED ORDER — HYDROXYZINE HCL 50 MG/ML IM SOLN
50 MG/ML | Freq: Once | INTRAMUSCULAR | Status: AC
Start: 2017-01-30 — End: 2017-01-30
  Administered 2017-01-30: 05:00:00 50 mg via INTRAMUSCULAR

## 2017-01-30 MED ORDER — TAMSULOSIN HCL 0.4 MG PO CAPS
0.4 MG | Freq: Every day | ORAL | Status: DC
Start: 2017-01-30 — End: 2017-01-31
  Administered 2017-01-31 (×2): 0.4 mg via ORAL

## 2017-01-30 MED ORDER — ONDANSETRON HCL 4 MG/2ML IJ SOLN
4 | INTRAMUSCULAR | Status: AC
Start: 2017-01-30 — End: 2017-01-30

## 2017-01-30 MED ORDER — SUCCINYLCHOLINE CHLORIDE 20 MG/ML IJ SOLN
20 MG/ML | INTRAMUSCULAR | Status: DC | PRN
Start: 2017-01-30 — End: 2017-01-30

## 2017-01-30 MED ORDER — FENTANYL CITRATE (PF) 100 MCG/2ML IJ SOLN
100 MCG/2ML | INTRAMUSCULAR | Status: DC | PRN
Start: 2017-01-30 — End: 2017-01-30

## 2017-01-30 MED ORDER — ESTROGENS CONJUGATED 0.3 MG PO TABS
0.3 MG | Freq: Every day | ORAL | Status: DC
Start: 2017-01-30 — End: 2017-01-31
  Administered 2017-01-30 – 2017-01-31 (×2): 0.9 mg via ORAL

## 2017-01-30 MED ORDER — MAGNESIUM HYDROXIDE 400 MG/5ML PO SUSP
400 MG/5ML | Freq: Every day | ORAL | Status: DC | PRN
Start: 2017-01-30 — End: 2017-01-30

## 2017-01-30 MED ORDER — NORMAL SALINE FLUSH 0.9 % IV SOLN
0.9 % | INTRAVENOUS | Status: DC | PRN
Start: 2017-01-30 — End: 2017-01-31
  Administered 2017-01-30: 17:00:00 10 mL via INTRAVENOUS

## 2017-01-30 MED ORDER — PROMETHAZINE HCL 25 MG/ML IJ SOLN
25 MG/ML | Freq: Four times a day (QID) | INTRAMUSCULAR | Status: DC | PRN
Start: 2017-01-30 — End: 2017-01-31
  Administered 2017-01-30 – 2017-01-31 (×2): 25 mg via INTRAMUSCULAR

## 2017-01-30 MED ORDER — SODIUM CHLORIDE 0.9 % IV SOLN
0.9 % | INTRAVENOUS | Status: DC
Start: 2017-01-30 — End: 2017-01-31
  Administered 2017-01-30 – 2017-01-31 (×3): via INTRAVENOUS

## 2017-01-30 MED ORDER — LEVETIRACETAM 500 MG PO TABS
500 MG | Freq: Two times a day (BID) | ORAL | Status: DC
Start: 2017-01-30 — End: 2017-01-30

## 2017-01-30 MED ORDER — HYDROXYZINE PAMOATE 50 MG PO CAPS
50 MG | Freq: Three times a day (TID) | ORAL | Status: DC | PRN
Start: 2017-01-30 — End: 2017-01-30

## 2017-01-30 MED ORDER — METFORMIN HCL 500 MG PO TABS
500 MG | Freq: Two times a day (BID) | ORAL | Status: DC
Start: 2017-01-30 — End: 2017-01-30

## 2017-01-30 MED ORDER — NICOTINE 21 MG/24HR TD PT24
21 MG/24HR | Freq: Every day | TRANSDERMAL | Status: DC
Start: 2017-01-30 — End: 2017-01-30

## 2017-01-30 MED ORDER — PROMETHAZINE HCL 25 MG RE SUPP
25 MG | Freq: Four times a day (QID) | RECTAL | Status: DC | PRN
Start: 2017-01-30 — End: 2017-01-31

## 2017-01-30 MED ORDER — FENTANYL CITRATE (PF) 100 MCG/2ML IJ SOLN
100 MCG/2ML | INTRAMUSCULAR | Status: DC | PRN
Start: 2017-01-30 — End: 2017-01-29

## 2017-01-30 MED ORDER — FENTANYL CITRATE (PF) 100 MCG/2ML IJ SOLN
100 | INTRAMUSCULAR | Status: DC | PRN
Start: 2017-01-30 — End: 2017-01-29

## 2017-01-30 MED ORDER — FENTANYL CITRATE (PF) 100 MCG/2ML IJ SOLN
100 MCG/2ML | Freq: Once | INTRAMUSCULAR | Status: DC
Start: 2017-01-30 — End: 2017-01-29

## 2017-01-30 MED ORDER — DOXYCYCLINE HYCLATE 100 MG PO TABS
100 MG | Freq: Two times a day (BID) | ORAL | Status: DC
Start: 2017-01-30 — End: 2017-01-31
  Administered 2017-01-31 (×2): 100 mg via ORAL

## 2017-01-30 MED ORDER — DEXAMETHASONE SODIUM PHOSPHATE 20 MG/5ML IJ SOLN
20 | INTRAMUSCULAR | Status: AC
Start: 2017-01-30 — End: 2017-01-30

## 2017-01-30 MED ORDER — LORAZEPAM 2 MG/ML IJ SOLN
2 MG/ML | Freq: Once | INTRAMUSCULAR | Status: AC
Start: 2017-01-30 — End: 2017-01-30
  Administered 2017-01-30: 07:00:00 0.5 mg via INTRAVENOUS

## 2017-01-30 MED ORDER — MIDAZOLAM HCL 2 MG/2ML IJ SOLN
2 MG/ML | INTRAMUSCULAR | Status: DC | PRN
Start: 2017-01-30 — End: 2017-01-30
  Administered 2017-01-30: 22:00:00 2 via INTRAVENOUS

## 2017-01-30 MED ORDER — LIDOCAINE HCL 2 % IJ SOLN
2 % | INTRAMUSCULAR | Status: DC | PRN
Start: 2017-01-30 — End: 2017-01-30
  Administered 2017-01-30: 22:00:00 100 via INTRAVENOUS

## 2017-01-30 MED ORDER — PROMETHAZINE HCL 25 MG/ML IJ SOLN
25 | INTRAMUSCULAR | Status: AC
Start: 2017-01-30 — End: 2017-01-30

## 2017-01-30 MED ORDER — FENTANYL CITRATE (PF) 100 MCG/2ML IJ SOLN
100 | INTRAMUSCULAR | Status: AC
Start: 2017-01-30 — End: 2017-01-30

## 2017-01-30 MED ORDER — OXYCODONE-ACETAMINOPHEN 7.5-325 MG PO TABS
Freq: Four times a day (QID) | ORAL | Status: DC | PRN
Start: 2017-01-30 — End: 2017-01-30

## 2017-01-30 MED ORDER — MIDAZOLAM HCL 2 MG/2ML IJ SOLN
2 | INTRAMUSCULAR | Status: AC
Start: 2017-01-30 — End: 2017-01-30

## 2017-01-30 MED ORDER — OXYCODONE-ACETAMINOPHEN 5-325 MG PO TABS
5-325 MG | Freq: Four times a day (QID) | ORAL | Status: DC | PRN
Start: 2017-01-30 — End: 2017-01-31

## 2017-01-30 MED ORDER — ACETAMINOPHEN 325 MG PO TABS
325 MG | ORAL | Status: DC | PRN
Start: 2017-01-30 — End: 2017-01-31
  Administered 2017-01-31: 02:00:00 650 mg via ORAL

## 2017-01-30 MED ORDER — PHENYTOIN SODIUM EXTENDED 100 MG PO CAPS
100 MG | Freq: Three times a day (TID) | ORAL | Status: DC
Start: 2017-01-30 — End: 2017-01-30

## 2017-01-30 MED ORDER — SUCCINYLCHOLINE CHLORIDE 20 MG/ML IJ SOLN
20 | INTRAMUSCULAR | Status: AC
Start: 2017-01-30 — End: 2017-01-30

## 2017-01-30 MED ORDER — VENLAFAXINE HCL ER 150 MG PO CP24
150 MG | Freq: Every day | ORAL | Status: DC
Start: 2017-01-30 — End: 2017-01-31
  Administered 2017-01-30 – 2017-01-31 (×2): 150 mg via ORAL

## 2017-01-30 MED ORDER — MORPHINE SULFATE 2 MG/ML IJ SOLN
2 MG/ML | INTRAMUSCULAR | Status: DC | PRN
Start: 2017-01-30 — End: 2017-01-31

## 2017-01-30 MED ORDER — LORAZEPAM 2 MG/ML IJ SOLN
2 MG/ML | Freq: Once | INTRAMUSCULAR | Status: DC
Start: 2017-01-30 — End: 2017-01-30

## 2017-01-30 MED ORDER — LABETALOL HCL 5 MG/ML IV SOLN
5 MG/ML | INTRAVENOUS | Status: DC | PRN
Start: 2017-01-30 — End: 2017-01-30

## 2017-01-30 MED ORDER — DEXAMETHASONE SODIUM PHOSPHATE 20 MG/5ML IJ SOLN
20 MG/5ML | INTRAMUSCULAR | Status: DC | PRN
Start: 2017-01-30 — End: 2017-01-30
  Administered 2017-01-30: 22:00:00 10 via INTRAVENOUS

## 2017-01-30 MED ORDER — DIPHENHYDRAMINE HCL 50 MG/ML IJ SOLN
50 MG/ML | Freq: Once | INTRAMUSCULAR | Status: DC | PRN
Start: 2017-01-30 — End: 2017-01-30

## 2017-01-30 MED ORDER — PANTOPRAZOLE SODIUM 40 MG PO TBEC
40 MG | Freq: Every day | ORAL | Status: DC
Start: 2017-01-30 — End: 2017-01-31
  Administered 2017-01-30 – 2017-01-31 (×2): 40 mg via ORAL

## 2017-01-30 MED ORDER — OXYBUTYNIN CHLORIDE 5 MG PO TABS
5 MG | Freq: Three times a day (TID) | ORAL | Status: DC | PRN
Start: 2017-01-30 — End: 2017-01-31
  Administered 2017-01-31 (×3): 5 mg via ORAL

## 2017-01-30 MED ORDER — GLYCOPYRROLATE 1 MG/5ML IV SOSY
1 | INTRAVENOUS | Status: AC
Start: 2017-01-30 — End: 2017-01-30

## 2017-01-30 MED ORDER — PROMETHAZINE HCL 25 MG/ML IJ SOLN
25 MG/ML | INTRAMUSCULAR | Status: DC | PRN
Start: 2017-01-30 — End: 2017-01-30
  Administered 2017-01-30: 22:00:00 25 via INTRAMUSCULAR

## 2017-01-30 MED ORDER — PROMETHAZINE HCL 25 MG/ML IJ SOLN
25 MG/ML | Freq: Once | INTRAMUSCULAR | Status: DC | PRN
Start: 2017-01-30 — End: 2017-01-30

## 2017-01-30 MED ORDER — OXYCODONE-ACETAMINOPHEN 7.5-325 MG PO TABS
Freq: Four times a day (QID) | ORAL | Status: DC | PRN
Start: 2017-01-30 — End: 2017-01-31
  Administered 2017-01-31 (×2): 1 via ORAL

## 2017-01-30 MED ORDER — FENTANYL CITRATE (PF) 100 MCG/2ML IJ SOLN
100 MCG/2ML | INTRAMUSCULAR | Status: AC
Start: 2017-01-30 — End: 2017-01-29
  Administered 2017-01-30: 01:00:00 100 via INTRAVENOUS

## 2017-01-30 MED ORDER — NORMAL SALINE FLUSH 0.9 % IV SOLN
0.9 % | Freq: Two times a day (BID) | INTRAVENOUS | Status: DC
Start: 2017-01-30 — End: 2017-01-31
  Administered 2017-01-30 – 2017-01-31 (×3): 10 mL via INTRAVENOUS

## 2017-01-30 MED ORDER — TRAZODONE HCL 100 MG PO TABS
100 MG | Freq: Every evening | ORAL | Status: DC
Start: 2017-01-30 — End: 2017-01-31
  Administered 2017-01-30 – 2017-01-31 (×2): 200 mg via ORAL

## 2017-01-30 MED ORDER — MEPERIDINE HCL 25 MG/ML IJ SOLN
25 MG/ML | INTRAMUSCULAR | Status: DC | PRN
Start: 2017-01-30 — End: 2017-01-30

## 2017-01-30 MED ORDER — MORPHINE SULFATE (PF) 4 MG/ML IV SOLN
4 MG/ML | INTRAVENOUS | Status: DC | PRN
Start: 2017-01-30 — End: 2017-01-30
  Administered 2017-01-30 (×3): 4 mg via INTRAVENOUS

## 2017-01-30 MED ORDER — HYDROXYZINE PAMOATE 50 MG PO CAPS
50 MG | Freq: Three times a day (TID) | ORAL | Status: DC | PRN
Start: 2017-01-30 — End: 2017-01-29

## 2017-01-30 MED ORDER — FENTANYL CITRATE (PF) 100 MCG/2ML IJ SOLN
100 MCG/2ML | INTRAMUSCULAR | Status: DC | PRN
Start: 2017-01-30 — End: 2017-01-30
  Administered 2017-01-30: 22:00:00 50 via INTRAVENOUS
  Administered 2017-01-30: 22:00:00 100 via INTRAVENOUS
  Administered 2017-01-30: 22:00:00 50 via INTRAVENOUS

## 2017-01-30 MED ORDER — ONDANSETRON HCL 4 MG/2ML IJ SOLN
4 MG/2ML | Freq: Four times a day (QID) | INTRAMUSCULAR | Status: DC | PRN
Start: 2017-01-30 — End: 2017-01-31
  Administered 2017-01-30 – 2017-01-31 (×3): 4 mg via INTRAVENOUS

## 2017-01-30 MED ORDER — SUCCINYLCHOLINE CHLORIDE 20 MG/ML IJ SOLN
20 MG/ML | INTRAMUSCULAR | Status: DC | PRN
Start: 2017-01-30 — End: 2017-01-30
  Administered 2017-01-30: 22:00:00 140 via INTRAVENOUS

## 2017-01-30 MED ORDER — OXYCODONE-ACETAMINOPHEN 7.5-325 MG PO TABS
Freq: Four times a day (QID) | ORAL | Status: DC | PRN
Start: 2017-01-30 — End: 2017-01-30
  Administered 2017-01-30 (×2): 1 via ORAL

## 2017-01-30 MED ORDER — MORPHINE SULFATE (PF) 4 MG/ML IV SOLN
4 MG/ML | INTRAVENOUS | Status: DC | PRN
Start: 2017-01-30 — End: 2017-01-31
  Administered 2017-01-30 – 2017-01-31 (×8): 4 mg via INTRAVENOUS

## 2017-01-30 MED ORDER — ONDANSETRON HCL 4 MG/2ML IJ SOLN
4 MG/2ML | Freq: Once | INTRAMUSCULAR | Status: AC
Start: 2017-01-30 — End: 2017-01-30
  Administered 2017-01-30: 16:00:00 4 mg via INTRAVENOUS

## 2017-01-30 MED ORDER — PRAMIPEXOLE DIHYDROCHLORIDE 0.25 MG PO TABS
0.25 MG | Freq: Every evening | ORAL | Status: DC
Start: 2017-01-30 — End: 2017-01-31
  Administered 2017-01-30 – 2017-01-31 (×2): 0.25 mg via ORAL

## 2017-01-30 MED ORDER — PROPOFOL 200 MG/20ML IV EMUL
200 MG/20ML | INTRAVENOUS | Status: DC | PRN
Start: 2017-01-30 — End: 2017-01-30
  Administered 2017-01-30: 22:00:00 160 via INTRAVENOUS

## 2017-01-30 MED FILL — PANTOPRAZOLE SODIUM 40 MG PO TBEC: 40 MG | ORAL | Qty: 1

## 2017-01-30 MED FILL — TAMSULOSIN HCL 0.4 MG PO CAPS: 0.4 MG | ORAL | Qty: 1

## 2017-01-30 MED FILL — ONDANSETRON HCL 4 MG/2ML IJ SOLN: 4 MG/2ML | INTRAMUSCULAR | Qty: 2

## 2017-01-30 MED FILL — TRAZODONE HCL 100 MG PO TABS: 100 MG | ORAL | Qty: 2

## 2017-01-30 MED FILL — FENTANYL CITRATE (PF) 100 MCG/2ML IJ SOLN: 100 MCG/2ML | INTRAMUSCULAR | Qty: 2

## 2017-01-30 MED FILL — MORPHINE SULFATE (PF) 4 MG/ML IV SOLN: 4 mg/mL | INTRAVENOUS | Qty: 1

## 2017-01-30 MED FILL — DOXYCYCLINE HYCLATE 100 MG PO TABS: 100 MG | ORAL | Qty: 1

## 2017-01-30 MED FILL — PHENYTOIN SODIUM EXTENDED 100 MG PO CAPS: 100 MG | ORAL | Qty: 1

## 2017-01-30 MED FILL — OXYBUTYNIN CHLORIDE 5 MG PO TABS: 5 MG | ORAL | Qty: 1

## 2017-01-30 MED FILL — HYDROXYZINE PAMOATE 50 MG PO CAPS: 50 MG | ORAL | Qty: 1

## 2017-01-30 MED FILL — MIDAZOLAM HCL 2 MG/2ML IJ SOLN: 2 MG/ML | INTRAMUSCULAR | Qty: 2

## 2017-01-30 MED FILL — OXYCODONE-ACETAMINOPHEN 7.5-325 MG PO TABS: ORAL | Qty: 1

## 2017-01-30 MED FILL — PRAMIPEXOLE DIHYDROCHLORIDE 0.25 MG PO TABS: 0.25 MG | ORAL | Qty: 1

## 2017-01-30 MED FILL — GLYCOPYRROLATE 1 MG/5ML IV SOSY: 1 MG/5ML | INTRAVENOUS | Qty: 5

## 2017-01-30 MED FILL — LEVETIRACETAM 500 MG PO TABS: 500 MG | ORAL | Qty: 1

## 2017-01-30 MED FILL — DEXAMETHASONE SODIUM PHOSPHATE 20 MG/5ML IJ SOLN: 20 MG/5ML | INTRAMUSCULAR | Qty: 5

## 2017-01-30 MED FILL — NICOTINE 21 MG/24HR TD PT24: 21 MG/24HR | TRANSDERMAL | Qty: 1

## 2017-01-30 MED FILL — PROVENTIL HFA 108 (90 BASE) MCG/ACT IN AERS: 108 (90 Base) MCG/ACT | RESPIRATORY_TRACT | Qty: 2.66

## 2017-01-30 MED FILL — VENLAFAXINE HCL ER 150 MG PO CP24: 150 MG | ORAL | Qty: 1

## 2017-01-30 MED FILL — METFORMIN HCL 500 MG PO TABS: 500 MG | ORAL | Qty: 1

## 2017-01-30 MED FILL — SUCCINYLCHOLINE CHLORIDE 20 MG/ML IJ SOLN: 20 MG/ML | INTRAMUSCULAR | Qty: 10

## 2017-01-30 MED FILL — PROMETHAZINE HCL 25 MG/ML IJ SOLN: 25 MG/ML | INTRAMUSCULAR | Qty: 1

## 2017-01-30 MED FILL — PREMARIN 0.3 MG PO TABS: 0.3 MG | ORAL | Qty: 3

## 2017-01-30 MED FILL — HYDROXYZINE HCL 50 MG/ML IM SOLN: 50 MG/ML | INTRAMUSCULAR | Qty: 1

## 2017-01-30 MED FILL — LIDOCAINE HCL (CARDIAC) 20 MG/ML IV SOLN: 20 MG/ML | INTRAVENOUS | Qty: 5

## 2017-01-30 MED FILL — PROPOFOL 200 MG/20ML IV EMUL: 200 MG/20ML | INTRAVENOUS | Qty: 20

## 2017-01-30 MED FILL — LORAZEPAM 2 MG/ML IJ SOLN: 2 MG/ML | INTRAMUSCULAR | Qty: 1

## 2017-01-30 NOTE — Progress Notes (Signed)
Pt admitted to  5K21.   Complaints: kidney pain.    IV normal saline infusing into the subclavian right, condition patent and no redness at a rate of 100 mls/ hour with about 900 mls in the bag still. IV site free of s/s of infection or infiltration. Vital signs obtained. Assessment and data collection initiated. Oriented to room. All questions answered with no further questions at this time. Fall prevention and safety brochure discussed with patient.   The best day to schedule a follow up Dr appointment is:  Monday a.m.  [place], RN 01/30/2017 1:06 AM

## 2017-01-30 NOTE — Brief Op Note (Signed)
Brief Postoperative Note    Theresa Hobbs  Date of Birth:  08/16/1966  [id number]    Pre-operative Diagnosis: left distal ureteral stone    Post-operative Diagnosis: same    Procedure:  cystoscopy with left ureteroscopy, basket retrieval of stone and placement of a left ureteral stent    Anesthesia: general    Surgeons/Assistants: Harle StanfordNicholson    Estimated Blood Loss: 5 cc    Complications: none    Specimens: stone for analysis    Findings: stone removed and stent placed    Electronically signed by Areatha Keasraig A Anjani Feuerborn, MD on 01/30/2017

## 2017-01-30 NOTE — Progress Notes (Signed)
Primary physician updated on need to readdress home medication list related to missing and discontinued medication through [facility] at this time.

## 2017-01-30 NOTE — Progress Notes (Addendum)
Hospitalist Progress Note    Patient:  Theresa Hobbs  Date of Birth: 12/14/1965  MRN: [id number]   PCP: No primary care provider on file.       Acct: [id number]  Unit/Bed: 5K-21/021-A    Date of Admission: 01/29/2017      Chief Complaint     Right flank pain    SUBJECTIVE     The patient is a 51 y.o.Marland Kitchenfemale who was admitted for obstructive uropathy.    She states that she is in 10/10 left  flank pain. Symptoms associated with nausea. She just received 4 mg IV morphine.    Plans for urological intervention today.      OBJECTIVE     Medications:  Reviewed    Ins and outs:      Intake/Output Summary (Last 24 hours) at 01/30/17 1355  Last data filed at 01/30/17 0255   Gross per 24 hour   Intake              668 ml   Output                0 ml   Net              668 ml       Physical Examination     BP (!) 142/91   Pulse 106   Temp 99.2 F (37.3 C) (Oral)   Resp 20   SpO2 92%     General appearance: Patient appears to be in significant distress during the examination. Patient also somnolent and falls asleep during the verbal interview. Naked in bed. States that she is not cold.  HEENT: Extraocular motion intact.    Neck: Supple, with full range of motion. No jugular venous distention. Trachea midline.  Respiratory:  Normal respiratory effort. Clear to auscultation, bilaterally without Rales/Wheezes/Rhonchi.  Cardiovascular: Regular rate and rhythm with normal S1/S2 without murmurs, rubs or gallops.  Abdomen: Soft, tender to palpation. Left flank tenderness.  non-distended with normal bowel sounds.  Musculoskeletal: passive and active ROM x 4 extremities.  Neurologic:  Neurovascularly intact without any focal sensory/motor deficits. Cranial nerves: II-XII intact, grossly non-focal.  Psychiatric: Alert and oriented.  Skin: Skin color, texture, turgor normal.  No rashes or lesions.    Labs     Recent Labs      01/29/17   1814  01/30/17   0655   WBC  18.1*  11.1*   HGB  16.6*  14.8   HCT  48.8*  43.6   PLT  227  202      Recent Labs      01/29/17   1814  01/30/17   0655   NA  141  143   K  4.1  4.3   CL  103  108   CO2  25  23   BUN  19  15   CREATININE  1.2  0.9   CALCIUM  9.4  8.7     Recent Labs      01/29/17   1814   AST  19   ALT  16   BILITOT  0.4   ALKPHOS  119     Urinalysis:    Lab Results   Component Value Date    NITRU NEGATIVE 01/29/2017    WBCUA 2-4 01/29/2017    WBCUA 0-2 01/11/2011    BACTERIA NONE 01/29/2017    RBCUA 10-15 01/29/2017    BLOODU MODERATE 01/29/2017    SPECGRAV  1.021 05/02/2014    GLUCOSEU NEGATIVE 01/29/2017       Diagnostic imaging/procedures         Ct Abdomen Pelvis Wo Contrast Additional Contrast? None    Result Date: 01/29/2017  PROCEDURE: CT ABDOMEN PELVIS WO CONTRAST CLINICAL INFORMATION: LLQ abdominal pain/if the stone is negative do CT WITH contrast for r/o Aortic dissection . COMPARISON: No prior study. TECHNIQUE: Noncontrast images at 5 mm intervals with multiplanar reconstructions All CT scans at this facility use dose modulation, iterative reconstruction, and/or weight-based dosing when appropriate to reduce radiation dose to as low as reasonably achievable. FINDINGS: Lung bases Lung bases are clear. Undersurface the heart unremarkable. Abdomen pelvis Solid organ evaluation is limited without IV contrast. The left kidney is edematous. There is perinephric edema. There is mild hydronephrosis. There is a stone at the left ureterovesical junction measuring 2 mm. There is possibly a more proximal stone measuring 3 mm but this may represent a phlebolith. Definite fluid attenuation does not surround this more proximal stone and the patient does have multiple phleboliths in the pelvis. Right kidney shows no hydronephrosis or stones. Right adrenal gland is normal. Left adrenal gland has a nodular appearance left limb of the left adrenal gland shows a 6.5 mm nodular appearance Hounsfield units are consistent with the lipid rich adenoma. Liver and spleen are unremarkable. Dilated common bile duct  postcholecystectomy patient. Pancreas is normal. No abnormal fluid collections within the pelvis. No bowel obstruction. Normal appendix.     2 mm left ureterovesical junction calculus with hydroureter ureteronephrosis and prominent perinephric edema. Possible second ureteral calculus several centimeters more proximal but this is not a definitive finding. **This report has been created using voice recognition software.  It may contain minor errors which are inherent in voice recognition technology.** Final report electronically signed by Dr. Avel Sensor on 01/29/2017 6:08 PM      ASSESSMENT     Active Hospital Problems    Diagnosis Date Noted   . Nephrolithiasis [N20.0] 01/30/2017   . Tobacco abuse [Z72.0] 01/30/2017   . Hydronephrosis due to obstruction of ureter [N13.2] 01/29/2017       1. Left uretovesical calculus/hydronephrosis with obstructive nephrolithiasis.  2. Tobacco abuse.    PLAN     1. Morphine/ Percocet/Toradol for pain control. Monitor patient's narcotic intake as patient was somnolent on interview.  2. Continue generous IV fluid hydration.   3. Urological intervention today.   4. Continue Ceftriaxone.        DVT prophylaxis: []  Lovenox                                 []  SCDs                                 []  SQ Heparin                                 []  Encourage ambulation           []  Already on Anticoagulation     Disposition:    [x]  Home       []  TCU       []  Rehab       []  Psych       []  SNF       []  Long  Term Care Facility       []  Other-    Anticipated Discharge in : 1 to 2 days.    Code Status: Full Code    Electronically signed by Frankey PootPatrice Kandiss Ihrig, MD on 01/30/2017 at 1:55 PM

## 2017-01-30 NOTE — Other (Signed)
Pt said that she will have surgery today and wanted prayer to heal and she was anointed.      01/30/17 1619   Encounter Summary   Services provided to: Patient   Referral/Consult From: Rounding   Support System Friends/neighbors   Place of [employer]   Continue Visiting Yes  (5/29)   Complexity of Encounter Moderate   Length of Encounter 30 minutes   Spiritual/Religious   Type Spiritual support   Assessment Approachable;Calm;Hopeful   Intervention Active listening;Nurtured hope;Anointing   Outcome Connection/belonging;Expressed gratitude;Engaged in conversation;Expressed feelings/needs/concerns   Sacraments   Sacrament of Sick-Anointing Anointed

## 2017-01-30 NOTE — Progress Notes (Signed)
Reached out to [place] since pt was still experiencing bladder spasms, was advised that pt could receive b&o suppository 16.2-30 mg every 6 hours as needed, however while putting order in this was flagged with her allergies.  Pt stated that she did have hives as a reaction to tramadol--so mediation is contraindicated at this time.

## 2017-01-30 NOTE — Progress Notes (Signed)
Patient reports to please call Onalee HuaDavid at [phone] when going to surgery/or.

## 2017-01-30 NOTE — Other (Signed)
1823 The pt is received from surgery into PACU bay 7. She is connected to the monitor. Arouses to name and falls back asleep easily, snoring. Report received from Christa Angola CRNA. She states the pt was having a lot of pain pre-op (yelling out in pain in pre-op holding area). She was medicated with 100 mcg iv fent and 2 mg iv versed. States her pulse ox was 86% prior to being medicated, however.   1830 The pt has a persistent, dry, nonproductive cough.   1835 The pt verbalizes the urge to void. She insists on getting up, but she is falling asleep very easily. Explained to her that she cannot get up in the PACU. She is at risk for falling. When placing her on a bedpan, it is noted that she has already been incontinent of urine.   1840 The pt pulls the bedpan out from under her and hands it to RN. The pt had scant bloody urine in pan. She asks for water. Still have a persistant cough. She is given a glass of water. Denies any pain. Only complaint is a headache.   1845 The pt states to "get me back to my room". Explained that She is being monitored in PACU after her surgery. Will get her back there when her time is up here in PACU since she denies any pain.   1848 The pt demands pain medication. RN states that she just asked her if she was having any other pain besides her headache and she stated no. Explained to her that if she is medicated in PACU that she will need to stay longer. She states that she has been having the same pain in PACU as she was prior to the surgery. She did not know RN was including that pain when asking her if she was having pain. She states it is spasms. Then she hollars out and and states "See, now I just went the bathroom. There's another one!" Explained to the pt that we will clean her up again shortly. The pt does not want to stay any longer in PACU. Explained that she will have pain medication ordered for her on the floor. And since she is having spasms, the doctor normally orders  ditropan for that. She is asked if they have her on that medication already and she snaps back by saying, "I don't know, you have my chart."   1850 Report is called to [facility] RN.  1855 The pt is turned in the bed and cleaned, linens changed. Peripad placed for incontinence. Once everything is changed, pt states she needs to void again. RN is getting a bedpan and the pt states she will just get up and begins to turn onto her side like she is going to get out of bed. Explained to the pt again that she cannot get out of bed. She insists that she can. Firmly stated to pt that she cannot get up after receiving anesthesia. She is at risk for falling. Will not discuss this with her again. She starts having a coughing fit and demands her inhaler. The pt is informed that we do not have her inhaler down here. She states "well someone can run it down for me." She mentions another incident that must have taken place prior to surgery where she states staff wouldn't get it for her then either. Explained to the pt that RN will call [facility] and see if someone can run it down for her. At this point, the pt  is upset, crying and refusing to talk with RN. States she wants another RN to take care of her. Explained that transport is here and they will take her back to her room and she will have a new RN there.   1900 Called [facility] and asked if someone can bring the pt's inhaler to PACU. The RN states that RT keeps those. So RT is called and they are in ED now. She asks if it is an emergency, and since the pt is stable at this time, she is told no. She will go to the pt's room when she is done in ER.   1905 The pt is transported to 5K21, crying and no longer speaking with RN. Family in waiting area is notified.

## 2017-01-30 NOTE — Progress Notes (Signed)
Reached out to IV therapy regarding need for pt to have new IV due to prior being red/swollen

## 2017-01-30 NOTE — Discharge Instructions (Signed)
Ureteral Stent Information  -Ureteral stents are hollow tubes with multiple side holes that coils in your kidney and proceeds down your ureter where it then coils in your bladder                              -Most stents are temporary, if you have a chronic  stent it will need to be exchanged regularly.    If left in longer than recommended, serious   complications of severe encrustation, UTI,   and/or obstruction and potential loss of kidney can occur    -Ureteral stents are used to relieve ureteral obstruction and promote ureteral healing following surgery    Why you may have a Stent:  -Ureteral obstruction secondary to kidney stones, ureteral stenosis, ureteral anastomosis, or preventative (prior to ESWL for large stone)    Stent Symptoms  . You may not have any symptoms at all   . Irritating voiding symptoms; urgency, frequency, feeling of incomplete bladder emptying, burning, blood in urine for up to 1-3 days, straining (all likely due to stent irritating the bladder)  . Suprapubic pressure/discomfort  . Flank pain    Stent Management  -You will likely be discharged home with:  . Pain medication- take as needed for severe pain  . Anticholinergic (Oxybutynin, Urispas)- These medications will decrease ureteral and bladder spasms that are likely causing discomfort  . Flomax-relaxes ureter  . Antibiotic-treats or prevents infection-take medication until completed  *Take all medications as prescribed    *If pain is severe take pain pill, take a hot shower for 10-15 minutes, and then apply heating pad to affected area      -Diet  . Normal diet,         . Increase water intake!!!! Try to consume at least 2 liters of water a day  . AVOID Caffeine-this can make symptoms worse!  -Activity  . Avoid strenuous activity!!  . Rest when tired  . Do not drive if taking narcotic pain medicine  *Call provider if developing symptoms of infection; fever, chills, pus in your urine, new onset body aches    Follow-up is Key part of  treatment and safety!!!

## 2017-01-30 NOTE — Care Coordination-Inpatient (Signed)
01/30/17, 2:15 PM      Theresa Hobbs       Admitted from: ER, patient presented with left lower flank pain.  01/29/2017/ [place] [facility] day: 0   Location: 5K-21/021-A Reason for admit: Hydronephrosis due to obstruction of ureter [N13.2] Status: Obs.   Admit order signed?: yes  PMH:  has a past medical history of Anxiety; Asthma; Cancer ([place]); Chronic kidney disease; Depression; Diabetes mellitus ([place]); Fibromyalgia; HTN (hypertension); Lupus; MRSA (methicillin resistant Staphylococcus aureus); Post traumatic stress disorder (PTSD); Psychiatric problem; and Seizures ([place]).  Procedure: 01/30/17 Cystoscopy, possible left ureteroscopy, possible laser lithotripsy, possible basket retrieval of stone fragments, and possible left ureteral stent placement per Dr. Lorel Monaco.  Pertinent abnormal Imaging: CT of abdomen:2 mm left ureterovesical junction calculus with hydroureter ureteronephrosis and prominent perinephric edema. Possible second ureteral calculus several centimeters more proximal but this is not a definitive finding.    Medications:  Scheduled Meds:  . doxycycline hyclate  100 mg Oral 2 times per day   . tamsulosin  0.4 mg Oral Daily   . estrogens (conjugated)  0.9 mg Oral Daily   . pantoprazole  40 mg Oral QAM AC   . pramipexole  0.25 mg Oral Nightly   . traZODone  200 mg Oral Nightly   . venlafaxine  150 mg Oral Daily   . sodium chloride flush  10 mL Intravenous 2 times per day     Continuous Infusions:  . sodium chloride 100 mL/hr at 01/30/17 0254      Pertinent Info/Orders/Treatment Plan:  Hospitalist consult, IV Rocephin and IV Cleocin x 1 dose given, prn IV Morphine or Percocet for pain, a.m. labs, oxygen, strain urine, telemetry.   Diet: Diet NPO, After Midnight   DVT Prophylaxis: SCD's ordered  Smoking status:  reports that she has been smoking Cigarettes.  She has a 0.60 pack-year smoking history. She has never used smokeless tobacco.   Influenza Vaccination Screening Completed: n/a  Pneumonia Vaccination  Screening Completed: yes  Core measures: VTE  PCP: No primary care provider on file.  Readmission: No  Discharge Planning  Current Residence:  Private Residence  Living Arrangements:  Alone   Support Systems:  Spouse/Significant Other  Current Services PTA:     Potential Assistance Needed:  N/A  Potential Assistance Purchasing Medications:  No  Does patient want to participate in local refill/ meds to beds program?  N/A  Type of Home Care Services:  None  Patient expects to be discharged to:  Private Residence  Expected Discharge date:  02/01/17  Follow Up Appointment: Best Day/ Time: Monday AM  Discharge Plan: Met with [name]. She states she has a new roommate. At discharge [name] would like to have [facility] services established. She would like skilled nursing for medication education and compliance.   Social Services Evaluation: yes

## 2017-01-30 NOTE — H&P (Signed)
History & Physical    Chief Complaint: Left lower quadrant abdominal pain    HISTORY OF PRESENT ILLNESS:                The patient is a 51 y.o. female with significant past medical history of MRSA, HTN, Fibromyalgia, DM, CKD, Asthma, and Kidney Cancer who presented to the ED with left lower quadrant pain.  Reports the pain began suddenly on 01/29/17.  Describes her pain as sharp, intermittent, frequent, rates it at 10/10.  Reports nothing makes it better or worse.  Says pain medications "aren't helping."  Reports urine is clear yellow.  Denies any chills, fever, chest pain, shortness of breath, hematuria, urgency, or frequency.  No history of kidney stones in the past.  She denies any history of complications with anesthesia.  She is a smoker.    Past Medical History:        Diagnosis Date   . Anxiety    . Asthma    . Cancer (HCC)     kidney    . Chronic kidney disease    . Depression    . Diabetes mellitus (HCC)     type 2    . Fibromyalgia    . HTN (hypertension)    . Lupus    . MRSA (methicillin resistant Staphylococcus aureus)    . Post traumatic stress disorder (PTSD)    . Psychiatric problem    . Seizures (HCC)     Pt voiced she has seizures.     Past Surgical History:        Procedure Laterality Date   . ABDOMINAL HERNIA REPAIR  02-07-13    [facility]- Repair Trocar Site Hernia  2. Repair Umbilical Hernia  3. Repair  [facility]   . CHOLECYSTECTOMY  2000    [place]    . COLONOSCOPY  2004    Dr. Pat Patrick    . HYSTERECTOMY  1998    [facility]    . NOSE SURGERY  2007    polyp removed-[facility]    . OTHER SURGICAL HISTORY Right 11/26/2015    Right Hand Invcision and Drainage by Dr Dellie Catholic   . UPPER GASTROINTESTINAL ENDOSCOPY  2004    Dr. Pat Patrick        Social History     Social History   . Marital status: Widowed     Spouse name: N/A   . Number of children: N/A   . Years of education: N/A     Occupational History   . unemployed      Social History Main Topics   . Smoking status: Current Every Day Smoker     Packs/day:  0.10     Years: 6.00     Types: Cigarettes     Last attempt to quit: 02/12/2013   . Smokeless tobacco: Never Used   . Alcohol use No   . Drug use: Yes     Types: Marijuana      Comment: occasional   . Sexual activity: Yes     Other Topics Concern   . Not on file     Social History Narrative   . No narrative on file       AL  Family History   Problem Relation Age of Onset   . Adopted: Yes   . Cancer Father      pancreatic    . Cancer Mother      uterine and ovarian    . Osteoporosis Mother  Allergies:    Allergies   Allergen Reactions   . Imitrex [Sumatriptan] Anaphylaxis   . Iv Dye [Iodides] Anaphylaxis   . Amoxicillin-Pot Clavulanate Hives and Itching   . Azithromycin Hives and Itching   . Bactrim Hives and Itching   . Cefuroxime Axetil Hives and Itching   . Ciprofloxacin Hives and Itching   . Erythromycin Hives and Itching   . Flagyl [Metronidazole] Hives and Itching   . Ketorolac Tromethamine Hives and Itching   . Klonopin [Clonazepam] Hives and Itching   . Levofloxacin Hives and Itching   . Nubain [Nalbuphine Hcl]      blister   . Other      Stadol-blisters    . Pcn [Penicillins] Hives and Itching   . Reglan [Metoclopramide]      Blistering    . Sulfa Antibiotics Hives and Itching   . Tramadol Hives and Itching       ROS:  Constitutional: Negative for chills, fatigue, fever, or weight loss.  Eyes: Denies reported visual changes.  ENT: Denies headache, difficulty swallowing, nose bleeds, ringing in ears, or earaches.  Cardiovascular: Negative for chest pain, palpitations, tachycardia or edema.  Respiratory: Denies cough or SOB.   GI:The patient denies anorexia, nausea or vomiting.  Reports LLQ pain.  GU: See HPI  Musculoskeletal: Patient denies low back pain or painful or reduced range of motion of the back or extremities.  Neurological: The patient denies any symptoms of neurological impairment orTIA's; no history of stroke.   Lymphatic: Denies swollen glands in neck, axillary or inguinal  areas.  Psychiatric: Denies anxiety or depression.  Skin: Denies rash or lesions.  The remainder of the complete ROS is negative    PHYSICAL EXAM:  VITALS:  BP (!) 142/91   Pulse 106   Temp 99.2 F (37.3 C) (Oral)   Resp 20   SpO2 92% .  Nursing note and vitals reviewed.  Constitutional:    Alert and oriented times 3, no acute distress and cooperative to examination with appropriate mood and affect.   HEENT:   Head:         Normocephalic and atraumatic.   Mouth/Throat:          Mucous membranes are normal.   Eyes:         EOM are normal. No scleral icterus.  Nose:    The external appearance of the nose is normal  Ears:     The ears appear normal to external inspection   Neck:         Supple, symmetrical, trachea midline, no adenopathy, thyroid symmetric, not enlarged and no tenderness.   Cardiovascular:        Normal rate, regular rhythm, S1 S2 heart sounds.    Pulmonary/Chest:       Chest symmetric with normal A/P diameter, no wheezes, rales, or rhonchi noted. Normal respiratory rate and rhthym.  No use of accessory muscles.   Abdominal:          Soft.LLQ pain and tenderness. Active bowel sounds.     Genitalia: Voiding clear yellow urine spontaneously without difficulty.  Musculoskeletal:    Normal range of motion. She exhibits no edema or tenderness of lower extremities.    Extremities:    No cyanosis, clubbing, or edema present.  Neurological:    Alert and oriented. No cranial nerve deficit. There are no focalizing motor or sensory deficits.    DATA:  CBC:   Lab Results   Component Value Date  WBC 11.1 01/30/2017    RBC 5.01 01/30/2017    RBC 4.40 01/22/2011    HGB 14.8 01/30/2017    HCT 43.6 01/30/2017    MCV 86.9 01/30/2017    MCH 29.5 01/30/2017    MCHC 33.9 01/30/2017    RDW 13.9 01/30/2017    PLT 202 01/30/2017    MPV 9.9 01/30/2017     BMP:    Lab Results   Component Value Date    NA 143 01/30/2017    K 4.3 01/30/2017    CL 108 01/30/2017    CO2 23 01/30/2017    BUN 15 01/30/2017    CREATININE 0.9  01/30/2017    CALCIUM 8.7 01/30/2017    LABGLOM 66 01/30/2017    GLUCOSE 114 01/30/2017    GLUCOSE 146 01/22/2011     BUN/Creatinine:    Lab Results   Component Value Date    BUN 15 01/30/2017    CREATININE 0.9 01/30/2017     Magnesium:    Lab Results   Component Value Date    MG 2.4 08/12/2010     Phosphorus:    Lab Results   Component Value Date    PHOS 5.3 08/12/2010     PT/INR:  No results found for: PROTIME, INR  U/A:    Lab Results   Component Value Date    COLORU YELLOW 01/29/2017    PHUR 5.5 01/29/2017    LABCAST NONE SEEN 05/02/2014    LABCAST NONE SEEN 05/02/2014    WBCUA 2-4 01/29/2017    WBCUA 0-2 01/11/2011    RBCUA 10-15 01/29/2017    MUCUS THREADS 05/02/2014    YEAST NONE SEEN 01/29/2017    BACTERIA NONE 01/29/2017    SPECGRAV 1.021 05/02/2014    LEUKOCYTESUR NEGATIVE 01/29/2017    UROBILINOGEN 0.2 01/29/2017    BILIRUBINUR NEGATIVE 01/29/2017    BILIRUBINUR NEGATIVE 01/11/2011    BLOODU MODERATE 01/29/2017    GLUCOSEU NEGATIVE 01/29/2017    AMORPHOUS DEBRIS 05/02/2014     Imaging:  The patient had a CT abdomen/pelvis without contrast which I have reviewed along with it's accompanying report.  The study suggests:    Impression   2 mm left ureterovesical junction calculus with hydroureter ureteronephrosis and prominent perinephric edema. Possible second ureteral calculus several centimeters more proximal but this is not a definitive finding.            **This report has been created using voice recognition software. It may contain minor errors which are inherent in voice recognition technology.**      Final report electronically signed by Dr. Avel Sensor on 01/29/2017 6:08 PM         IMPRESSION:  1.  Left ureterovesical junction calculus 2 mm  2.  Possible left proximal ureteral stone 3 mm  3. Left hydroureteronephrosis   4. Hx HTN, CKD, DM, kidney cancer, fibromyalgia, MRSA, asthma  5. Tobacco abuse    Plan:    Plan OR today.  Patient will undergo Cystoscopy, possible left ureteroscopy, possible  laser lithotripsy, possible basket retrieval of stone fragments, and possible left ureteral stent placement per Dr. Harle Stanford.  I described the procedure in detail and also described the associated risks and benefits at length. We discussed possible alternative therapies. We discussed the risks and benefits of not undergoing therapy. Patient understands these risks and benefits and desires to proceed.Post-op expectations were discussed; stent pain, urinary frequency and urgency secondary to the stent, dysuria which should improve 1-2 days after  procedure, and intermittent hematuria can be expected as long as stent is in place.      Currently pain is not under control.  Will adjust pain medications while she is waiting to go to surgery.      Electronically signed by Jone Baseman, APRN - CNP on 01/30/2017 at 9:50 AM  Urology    _____________________________________________________________________    I have seen and examined the patient independently on 01/30/2017 and I have reviewed all radiology reports and films along with pertinent laboratory values.  I agree with the above assessment and plan with the following additions:      Review of Systems  No problems with ears, nose or throat.  No problems with eyes.  No chest pain, shortness of breath, abdominal pain, extremity pain or weakness, and no neurological deficits.  No rashes.  No swollen glands or lymph nodes.  GU symptoms per HPI.  The remainder of the review of symptoms is negative.      Exam  General: alert and oriented. Cooperative.    HENT: Normocephalic, Atraumatic.  Eyes: No scleral icterus, mucous membranes moist.  Respiratory: Effort normal.   Skin: No rashes or obvious lesions.    Radiology  The patient has had a CT scan that I have reviewed along with its accompanying report.  The study demonstrates a small stone at the left UVJ.    Impression   2 mm left ureterovesical junction calculus with hydroureter ureteronephrosis and prominent perinephric  edema. Possible second ureteral calculus several centimeters more proximal but this is not a definitive finding.       Plan:  My plan will be to schedule cytoscopy with left ureteroscopy, possible laser lithotripsy, possible basket retrieval of stone fragments and possible placement of a ureteral stent.     I described the procedure in detail and also described the associated risks and benefits at length.  We discussed possible alternative therapies.  We discussed the risks and benefits of not undergoing therapy.  Theresa Hobbs understands these risks and benefits and desires to proceed.  He will be scheduled for the procedure in the very near future.     Thank you for the opportunity of allowing [place] to participate in Tatyana's care.      Areatha Keas  01/30/2017    [phone]

## 2017-01-30 NOTE — Anesthesia Pre-Procedure Evaluation (Signed)
Department of Anesthesiology  Preprocedure Note       Name:  Theresa Hobbs   Age:  51 y.o.  DOB:  11/18/1965                                          MRN:  [id number]         Date:  01/30/2017      Surgeon: Moishe SpiceSurgeon(s):  Areatha Keasraig A Nicholson, MD    Procedure: Procedure(s):  CYSTSOCOPY, LEFT URETEROSCOPY, LASER LITHOTRIPSY, BASKET EXTRACTION OF STONE FRAGMENTS    Medications prior to admission:   Prior to Admission medications    Medication Sig Start Date End Date Taking? Authorizing Provider   tamsulosin (FLOMAX) 0.4 MG capsule Take 0.4 mg by mouth daily   Yes Historical Provider, MD   albuterol sulfate HFA 108 (90 Base) MCG/ACT inhaler Inhale 2 puffs into the lungs every 6 hours as needed for Wheezing   Yes Historical Provider, MD   estrogens, conjugated, (PREMARIN) 0.9 MG tablet Take 1 tablet by mouth daily 11/29/15   Bill SalinasMichael J Muha, MD   pramipexole (MIRAPEX) 0.25 MG tablet Take 1 tablet by mouth nightly 11/29/15   Bill SalinasMichael J Muha, MD   omeprazole (PRILOSEC) 40 MG delayed release capsule Take 40 mg by mouth daily    Historical Provider, MD   traZODone (DESYREL) 100 MG tablet Take 200 mg by mouth nightly    Historical Provider, MD   venlafaxine (EFFEXOR-XR) 150 MG XR capsule Take 150 mg by mouth daily.    Historical Provider, MD       Current medications:    Current Facility-Administered Medications   Medication Dose Route Frequency Provider Last Rate Last Dose   . doxycycline hyclate (VIBRA-TABS) tablet 100 mg  100 mg Oral 2 times per day Joneen RoachHolly Ann Borchers-Ellinger, APRN - CNP       . albuterol sulfate HFA 108 (90 Base) MCG/ACT inhaler 2 puff  2 puff Inhalation Q6H PRN Regan LemmingBrittany K Clark, APRN - CNP   2 puff at 01/30/17 1057   . tamsulosin (FLOMAX) capsule 0.4 mg  0.4 mg Oral Daily Regan LemmingBrittany K Clark, APRN - CNP       . morphine injection 2 mg  2 mg Intravenous Q2H PRN Regan LemmingBrittany K Clark, APRN - CNP        Or   . morphine (PF) injection 4 mg  4 mg Intravenous Q2H PRN Regan LemmingBrittany K Clark, APRN - CNP   4 mg at 01/30/17 1511   .  oxyCODONE-acetaminophen (PERCOCET) 5-325 MG per tablet 1 tablet  1 tablet Oral Q6H PRN Frankey PootPatrice Scipio, MD        Or   . oxyCODONE-acetaminophen (PERCOCET) 7.5-325 MG per tablet 1 tablet  1 tablet Oral Q6H PRN Frankey PootPatrice Scipio, MD       . LORazepam (ATIVAN) injection 1 mg  1 mg Intravenous Once Regan LemmingBrittany K Clark, APRN - CNP       . estrogens (conjugated) (PREMARIN) tablet 0.9 mg  0.9 mg Oral Daily Marquis LunchJohn N Kamau, MD   0.9 mg at 01/30/17 0917   . pantoprazole (PROTONIX) tablet 40 mg  40 mg Oral QAM AC Marquis LunchJohn N Kamau, MD   40 mg at 01/30/17 0603   . pramipexole (MIRAPEX) tablet 0.25 mg  0.25 mg Oral Nightly Marquis LunchJohn N Kamau, MD   0.25 mg at 01/29/17 2234   . traZODone (DESYREL)  tablet 200 mg  200 mg Oral Nightly Marquis Lunch, MD   200 mg at 01/29/17 2255   . venlafaxine (EFFEXOR XR) extended release capsule 150 mg  150 mg Oral Daily Marquis Lunch, MD   150 mg at 01/30/17 0917   . sodium chloride flush 0.9 % injection 10 mL  10 mL Intravenous 2 times per day Joneen Roach, APRN - CNP   10 mL at 01/30/17 0920   . sodium chloride flush 0.9 % injection 10 mL  10 mL Intravenous PRN Joneen Roach, APRN - CNP   10 mL at 01/30/17 1301   . acetaminophen (TYLENOL) tablet 650 mg  650 mg Oral Q4H PRN Joneen Roach, APRN - CNP       . magnesium hydroxide (MILK OF MAGNESIA) 400 MG/5ML suspension 30 mL  30 mL Oral Daily PRN Joneen Roach, APRN - CNP       . ondansetron (ZOFRAN) injection 4 mg  4 mg Intravenous Q6H PRN Joneen Roach, APRN - CNP   4 mg at 01/30/17 1312   . 0.9 % sodium chloride infusion   Intravenous Continuous Joneen Roach, APRN - CNP 100 mL/hr at 01/30/17 1610     . promethazine (PHENERGAN) suppository 25 mg  25 mg Rectal Q6H PRN Joneen Roach, APRN - CNP        Or   . promethazine (PHENERGAN) injection 25 mg  25 mg Intramuscular Q6H PRN Joneen Roach, APRN - CNP   25 mg at 01/29/17 2138   . hydrOXYzine (VISTARIL)  capsule 50 mg  50 mg Oral TID PRN Benedicta Udeagbala, DO           Allergies:    Allergies   Allergen Reactions   . Imitrex [Sumatriptan] Anaphylaxis   . Iv Dye [Iodides] Anaphylaxis   . Amoxicillin-Pot Clavulanate Hives and Itching   . Azithromycin Hives and Itching   . Bactrim Hives and Itching   . Cefuroxime Axetil Hives and Itching   . Ciprofloxacin Hives and Itching   . Erythromycin Hives and Itching   . Flagyl [Metronidazole] Hives and Itching   . Ketorolac Tromethamine Hives and Itching   . Klonopin [Clonazepam] Hives and Itching   . Levofloxacin Hives and Itching   . Nubain [Nalbuphine Hcl]      blister   . Other      Stadol-blisters    . Pcn [Penicillins] Hives and Itching   . Reglan [Metoclopramide]      Blistering    . Sulfa Antibiotics Hives and Itching   . Tramadol Hives and Itching       Problem List:    Patient Active Problem List   Diagnosis Code   . Seizure (HCC) R56.9   . Head ache R51   . Nausea and vomiting R11.2   . Obesity E66.9   . Diarrhea R19.7   . Cancer of kidney (HCC) C64.9   . PTSD (post-traumatic stress disorder) F43.10   . Cancer (HCC) C80.1   . Chronic kidney disease N18.9   . Psychiatric problem F99   . Seizures (HCC) R56.9   . Lupus L93.0   . Depression F32.9   . Anxiety F41.9   . Fibromyalgia M79.7   . Post traumatic stress disorder (PTSD) F43.10   . S/P repair of ventral hernia Z98.890, Z87.19   . Cellulitis of hand, right L03.113   . 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 Nephrolithiasis  N20.0   . Tobacco abuse Z72.0       Past Medical History:        Diagnosis Date   . Anxiety    . Asthma    . Cancer (HCC)     kidney    . Chronic kidney disease    . Depression    . Diabetes mellitus (HCC)     type 2    . Fibromyalgia    . HTN (hypertension)    . Lupus    . MRSA (methicillin resistant Staphylococcus aureus)    . Post traumatic stress disorder (PTSD)    . Psychiatric problem    . Seizures (HCC)     Pt voiced she has seizures.       Past Surgical History:         Procedure Laterality Date   . ABDOMINAL HERNIA REPAIR  02-07-13    [facility]- Repair Trocar Site Hernia  2. Repair Umbilical Hernia  3. Repair  RIH   . CHOLECYSTECTOMY  2000    [place]    . COLONOSCOPY  2004    Dr. Pat Patrick    . HYSTERECTOMY  1998    [facility]    . NOSE SURGERY  2007    polyp removed-[facility]    . OTHER SURGICAL HISTORY Right 11/26/2015    Right Hand Invcision and Drainage by Dr Dellie Catholic   . UPPER GASTROINTESTINAL ENDOSCOPY  2004    Dr. Pat Patrick        Social History:    Social History   Substance Use Topics   . Smoking status: Current Every Day Smoker     Packs/day: 0.10     Years: 6.00     Types: Cigarettes     Last attempt to quit: 02/12/2013   . Smokeless tobacco: Never Used   . Alcohol use No                                Ready to quit: Not Answered  Counseling given: Not Answered      Vital Signs (Current):   Vitals:    01/29/17 2130 01/30/17 0245 01/30/17 0758 01/30/17 1520   BP: (!) 140/71 133/71 (!) 142/91 129/87   Pulse: 115 91 106 98   Resp: 20 24 20 16    Temp: 98.4 F (36.9 C) 98.8 F (37.1 C) 99.2 F (37.3 C) 98.2 F (36.8 C)   TempSrc: Axillary Oral Oral Oral   SpO2: 93% 93% 92% 92%                                              BP Readings from Last 3 Encounters:   01/30/17 129/87   12/13/15 128/74   12/12/15 (!) 143/65       NPO Status:                                                                                 BMI:   Wt Readings from Last 3 Encounters:  12/11/15 236 lb (107 kg)   12/04/15 200 lb (90.7 kg)   11/30/15 251 lb 3.2 oz (113.9 kg)     There is no height or weight on file to calculate BMI.    CBC:   Lab Results   Component Value Date    WBC 11.1 01/30/2017    RBC 5.01 01/30/2017    RBC 4.40 01/22/2011    HGB 14.8 01/30/2017    HCT 43.6 01/30/2017    MCV 86.9 01/30/2017    RDW 13.9 01/30/2017    PLT 202 01/30/2017       CMP:   Lab Results   Component Value Date    NA 143 01/30/2017    K 4.3 01/30/2017    CL 108 01/30/2017    CO2 23 01/30/2017    BUN 15 01/30/2017     CREATININE 0.9 01/30/2017    LABGLOM 66 01/30/2017    GLUCOSE 114 01/30/2017    GLUCOSE 146 01/22/2011    PROT 7.9 01/29/2017    CALCIUM 8.7 01/30/2017    BILITOT 0.4 01/29/2017    ALKPHOS 119 01/29/2017    AST 19 01/29/2017    ALT 16 01/29/2017       POC Tests: No results for input(s): POCGLU, POCNA, POCK, POCCL, POCBUN, POCHEMO, POCHCT in the last 72 hours.    Coags: No results found for: PROTIME, INR, APTT    HCG (If Applicable):   Lab Results   Component Value Date    PREGSERUM NEGATIVE 04/27/2011        ABGs: No results found for: PHART, PO2ART, PCO2ART, HCO3ART, BEART, O2SATART     Type & Screen (If Applicable):  No results found for: LABABO, LABRH    Anesthesia Evaluation  Patient summary reviewed and Nursing notes reviewed  Airway: Mallampati: II  TM distance: >3 FB   Neck ROM: full  Mouth opening: > = 3 FB Dental:      Comment: Poor dentition, multiple missing teeth, nothing loose per patient.    Pulmonary:normal exam  breath sounds clear to auscultation  (+) asthma:                            Cardiovascular:  Exercise tolerance: good (>4 METS),   (+) hypertension:,       ECG reviewed  Rhythm: regular  Rate: normal           Beta Blocker:  Not on Beta Blocker         Neuro/Psych:   (+) headaches:, psychiatric history:            GI/Hepatic/Renal:   (+) GERD: well controlled, renal disease: CRI,           Endo/Other:    (+) Diabetes, .                 Abdominal:   (+) obese,         Vascular:                                        Anesthesia Plan      general     ASA 2       Induction: intravenous.    MIPS: Prophylactic antiemetics administered.  Anesthetic plan and risks discussed with patient.      Plan discussed with CRNA.  Pernell Dupre, MD   01/30/2017

## 2017-01-30 NOTE — Care Coordination-Inpatient (Signed)
[name] states she was going to NP [facility]. She now is assigned to NP Earlie Ravelingourtney Ballers team. Care Team updated. Electronically signed by Toni Arthursamara R Toland, RN on 01/30/17 at 3:02 PM

## 2017-01-30 NOTE — Progress Notes (Signed)
Off the unit to surgery.  Pain uncontrolled at this time.

## 2017-01-30 NOTE — Op Note (Signed)
[facility]  RECORD OF OPERATION     PATIENT NAME: Theresa RankinsJo A Hobbs               MEDICAL RECORD NO. [id number]                DATE OF PROCEDURE: 01/30/2017  SURGEON: Tasia Catchingsraig A. Harle StanfordNicholson, MD  PRIMARY CARE PHYSICIAN: Nils Flackourtney E Baller, APRN - CNP        PREOPERATIVE DIAGNOSIS: left distal ureteral stone with severe pain     POSTOPERATIVE DIAGNOSIS: same      PROCEDURE PERFORMED: cystoscopy with left ureteroscopy, basket retrieval of stone and placement of left ureteral stent     SURGEON: Tasia Catchingsraig A. Harle StanfordNicholson, MD    ASSISTANT(S): none     ANESTHESIA: general     BLOOD LOSS:  5 cc     SPECIMENS: stone for analysis     COMPLICATIONS:  None immediately appreciated.     DISCUSSION:  [name] is a 51 y.o.-year-old female who has a diagnosis of a distal left ureteral stone with severe pain. After a history and physical examination was performed, potential diagnostic and therapeutic modalities were discussed with the patient.  Left ureteroscopy was recommended and a discussion of the risks, possible complications, possible side effects, along with the anticipated benefits were reviewed. She was given the opportunity to ask questions.  Once answered, informed consent was obtained.  She was brought to the operating on 01/30/2017 for this procedure.    PROCEDURE: [name] was taken to the OR and transferred to the operating room table.  After initiation of general anesthesia the patient was placed in lithotomy position for cystoscopy.  Her groin was prepped and draped in the standard fashion for cystoscopy.    A 22 french cystoscope was passed per urethra and the left ureteral orifice was seen.  A dual-flex wire was passed through the orifice and up to the kidney under fluoroscopic guidance.  The wire was left indwelling and the scope was removed.    A semi-rigid ureteroscope was then passed up to the level of the stone and the stone was directly visualized.  The stone was able to be grasped and gently removed using a zero-tip Nitinol  basket.    Following the removal of the stones the wire was used to pass a 6 [place], silicone ureteral stent.  A good coil was visualized in the renal pelvis and also in the bladder via fluoroscopy as the stent was placed.  The distal coil was also visualized directly via the cystoscope as the bladder was drained.    [name] was taken out of lithotomy position after being cleaned and dried.  She was transferred to the PACU in stable condition.    She tolerated the procedure well, there were no complications.

## 2017-01-30 NOTE — Anesthesia Post-Procedure Evaluation (Signed)
Department of Anesthesiology  Postprocedure Note    Patient: Theresa Hobbs  MRN: [id number]  Birthdate: 01/25/1966  Date of evaluation: 01/30/2017  Time:  11:04 PM     Procedure Summary     Date:  01/30/17 Room / Location:  [facility]    Anesthesia Start:  1745 Anesthesia Stop:  1827    Procedure:  CYSTSOCOPY, LEFT URETEROSCOPY, , BASKET EXTRACTION OF STONE FRAGMENTS and stent placement (N/A Ureter) Diagnosis:  (LEFT KIDNEY STONE)    Surgeon:  Areatha Keasraig A Nicholson, MD Responsible Provider:  Pernell Dupreennis Zakir Henner, MD    Anesthesia Type:  general ASA Status:  2          Anesthesia Type: general    Aldrete Phase I: Aldrete Score: 9    Aldrete Phase II:      Last vitals: Reviewed and per EMR flowsheets.       Anesthesia Post Evaluation    Patient location during evaluation: PACU  Patient participation: complete - patient participated  Level of consciousness: awake and alert  Airway patency: patent  Nausea & Vomiting: no nausea and no vomiting  Complications: no  Cardiovascular status: hemodynamically stable  Respiratory status: acceptable  Hydration status: euvolemic      [facility]  POST-ANESTHESIA NOTE       Name:  Theresa Hobbs                                         Age:  51 y.o.  MRN:  [id number]      Last Vitals:  BP (!) 157/68   Pulse 108   Temp 99 F (37.2 C) (Axillary)   Resp 22   SpO2 94%   Patient Vitals for the past 4 hrs:   BP Temp Temp src Pulse Resp SpO2   01/30/17 2115 (!) 157/68 99 F (37.2 C) Axillary 108 22 94 %   01/30/17 1915 131/73 98.9 F (37.2 C) Axillary 101 20 91 %       Level of Consciousness:  Awake    Respiratory:  Stable    Oxygen Saturation:  Stable    Cardiovascular:  Stable    Hydration:  Adequate    PONV:  Stable    Post-op Pain:  Adequate analgesia    Post-op Assessment:  No apparent anesthetic complications    Additional Follow-Up / Treatment / Comment:  None    Pernell DupreENNIS Neftali Abair, MD  Jan 30, 2017   11:04 PM

## 2017-01-30 NOTE — Progress Notes (Signed)
Pt verbalized that she was going down to smoke with her friend, Val prior to her leaving for the night.  Pt was educated on risks, especially after smoking and that there was no smoking on hospital campus.  Pt stated that " after the day she had she was going down to smoke."  Was able to have pt agree to having a PCT Ree KidaJack from [employer] go with her downstairs for safety and to prevent falls with leaving the floor.  Pt verbalized she would wait to have him go with her.  Alesia BandaLeAnne, RN and charge also updated with situation and pt going off floor.

## 2017-01-31 MED ORDER — OXYCODONE-ACETAMINOPHEN 5-325 MG PO TABS
5-325 MG | ORAL_TABLET | Freq: Four times a day (QID) | ORAL | 0 refills | Status: DC | PRN
Start: 2017-01-31 — End: 2017-01-31

## 2017-01-31 MED ORDER — PHENAZOPYRIDINE HCL 200 MG PO TABS
200 MG | ORAL_TABLET | Freq: Three times a day (TID) | ORAL | 0 refills | Status: DC
Start: 2017-01-31 — End: 2017-01-31

## 2017-01-31 MED ORDER — FLAVOXATE HCL 100 MG PO TABS
100 MG | Freq: Three times a day (TID) | ORAL | Status: DC
Start: 2017-01-31 — End: 2017-01-31
  Administered 2017-01-31: 19:00:00 100 mg via ORAL

## 2017-01-31 MED ORDER — FLAVOXATE HCL 100 MG PO TABS
100 MG | ORAL_TABLET | Freq: Three times a day (TID) | ORAL | 1 refills | Status: DC | PRN
Start: 2017-01-31 — End: 2017-04-18

## 2017-01-31 MED ORDER — OXYCODONE-ACETAMINOPHEN 5-325 MG PO TABS
5-325 MG | Freq: Once | ORAL | Status: AC
Start: 2017-01-31 — End: 2017-01-31
  Administered 2017-01-31: 17:00:00 2 via ORAL

## 2017-01-31 MED ORDER — DOXYCYCLINE HYCLATE 100 MG PO TABS
100 MG | ORAL_TABLET | Freq: Two times a day (BID) | ORAL | 0 refills | Status: DC
Start: 2017-01-31 — End: 2017-01-31

## 2017-01-31 MED ORDER — MORPHINE SULFATE 4 MG/ML IJ SOLN
4 MG/ML | Freq: Once | INTRAMUSCULAR | Status: DC
Start: 2017-01-31 — End: 2017-01-31

## 2017-01-31 MED ORDER — PHENAZOPYRIDINE HCL 200 MG PO TABS
200 MG | ORAL_TABLET | Freq: Three times a day (TID) | ORAL | 0 refills | Status: AC
Start: 2017-01-31 — End: 2017-02-10

## 2017-01-31 MED ORDER — DOXYCYCLINE HYCLATE 100 MG PO TABS
100 MG | ORAL_TABLET | Freq: Two times a day (BID) | ORAL | 0 refills | Status: AC
Start: 2017-01-31 — End: 2017-02-10

## 2017-01-31 MED ORDER — OXYBUTYNIN CHLORIDE 5 MG PO TABS
5 MG | ORAL_TABLET | Freq: Three times a day (TID) | ORAL | 1 refills | Status: DC | PRN
Start: 2017-01-31 — End: 2017-01-31

## 2017-01-31 MED ORDER — OXYCODONE-ACETAMINOPHEN 5-325 MG PO TABS
5-325 MG | ORAL_TABLET | Freq: Four times a day (QID) | ORAL | 0 refills | Status: DC | PRN
Start: 2017-01-31 — End: 2017-02-01

## 2017-01-31 MED ORDER — OXYBUTYNIN CHLORIDE 5 MG PO TABS
5 MG | Freq: Three times a day (TID) | ORAL | Status: DC
Start: 2017-01-31 — End: 2017-01-31
  Administered 2017-01-31: 15:00:00 5 mg via ORAL

## 2017-01-31 MED ORDER — HYDROXYZINE PAMOATE 50 MG PO CAPS
50 MG | Freq: Three times a day (TID) | ORAL | Status: DC | PRN
Start: 2017-01-31 — End: 2017-01-31
  Administered 2017-01-31: 16:00:00 50 mg via ORAL

## 2017-01-31 MED ORDER — OXYBUTYNIN CHLORIDE 5 MG PO TABS
5 MG | ORAL_TABLET | Freq: Three times a day (TID) | ORAL | 1 refills | Status: DC | PRN
Start: 2017-01-31 — End: 2017-02-20

## 2017-01-31 MED ORDER — LORAZEPAM 2 MG/ML IJ SOLN
2 MG/ML | Freq: Once | INTRAMUSCULAR | Status: AC
Start: 2017-01-31 — End: 2017-01-30
  Administered 2017-01-31: 02:00:00 0.5 mg via INTRAVENOUS

## 2017-01-31 MED ORDER — FLAVOXATE HCL 100 MG PO TABS
100 MG | ORAL_TABLET | Freq: Three times a day (TID) | ORAL | 1 refills | Status: DC | PRN
Start: 2017-01-31 — End: 2017-01-31

## 2017-01-31 MED ORDER — PHENAZOPYRIDINE HCL 200 MG PO TABS
200 MG | Freq: Three times a day (TID) | ORAL | Status: DC
Start: 2017-01-31 — End: 2017-01-31
  Administered 2017-01-31: 17:00:00 200 mg via ORAL

## 2017-01-31 MED FILL — OXYCODONE-ACETAMINOPHEN 7.5-325 MG PO TABS: ORAL | Qty: 1

## 2017-01-31 MED FILL — PHENAZOPYRIDINE HCL 200 MG PO TABS: 200 MG | ORAL | Qty: 1

## 2017-01-31 MED FILL — PROMETHAZINE HCL 25 MG/ML IJ SOLN: 25 MG/ML | INTRAMUSCULAR | Qty: 1

## 2017-01-31 MED FILL — FLAVOXATE HCL 100 MG PO TABS: 100 MG | ORAL | Qty: 1

## 2017-01-31 MED FILL — MORPHINE SULFATE (PF) 4 MG/ML IV SOLN: 4 mg/mL | INTRAVENOUS | Qty: 1

## 2017-01-31 MED FILL — OXYBUTYNIN CHLORIDE 5 MG PO TABS: 5 MG | ORAL | Qty: 1

## 2017-01-31 MED FILL — PANTOPRAZOLE SODIUM 40 MG PO TBEC: 40 MG | ORAL | Qty: 1

## 2017-01-31 MED FILL — OXYCODONE-ACETAMINOPHEN 5-325 MG PO TABS: 5-325 MG | ORAL | Qty: 2

## 2017-01-31 MED FILL — HYDROXYZINE PAMOATE 50 MG PO CAPS: 50 MG | ORAL | Qty: 1

## 2017-01-31 MED FILL — ACETAMINOPHEN 325 MG PO TABS: 325 MG | ORAL | Qty: 2

## 2017-01-31 MED FILL — PROMETHEGAN 25 MG RE SUPP: 25 MG | RECTAL | Qty: 1

## 2017-01-31 MED FILL — TAMSULOSIN HCL 0.4 MG PO CAPS: 0.4 MG | ORAL | Qty: 1

## 2017-01-31 MED FILL — LORAZEPAM 2 MG/ML IJ SOLN: 2 MG/ML | INTRAMUSCULAR | Qty: 1

## 2017-01-31 MED FILL — ONDANSETRON HCL 4 MG/2ML IJ SOLN: 4 MG/2ML | INTRAMUSCULAR | Qty: 2

## 2017-01-31 MED FILL — TRAZODONE HCL 100 MG PO TABS: 100 MG | ORAL | Qty: 2

## 2017-01-31 NOTE — Discharge Instructions (Signed)
.   Good nutrition is important when healing from an illness, injury, or surgery.  Follow any nutrition recommendations given to you during your hospital stay.   . If you were given an oral nutrition supplement while in the hospital, continue to take this supplement at home.  You can take it with meals, in-between meals, and/or before bedtime. These supplements can be purchased at most local grocery stores, pharmacies, and chain super-stores.   . If you have any questions about your diet or nutrition, call the hospital and ask for the dietitian.  general

## 2017-01-31 NOTE — Plan of Care (Signed)
Problem: Pain:  Goal: Pain level will decrease  Pain level will decrease   Outcome: Ongoing  Pt currently having pain rated between a 7-10/10 from bladder spasms, which have lessened overall after stent placed/basket removal, but is still occurring with PRN ditropan PRN medication. Pain goal of "4" is not currently being met at this time.  Goal: Control of acute pain  Control of acute pain   Outcome: Ongoing  Pt has been having bladder spasms and pain in bladder area after post-op stent placement/renal calculi that took place 5/29.  Pt has been very anxious throughout night, but PRN ativan dose did help when given to pt earlier in night.  Pain goal of "4" is not being met per pt.  Goal: Control of chronic pain  Control of chronic pain   Outcome: Completed Date Met: 01/31/17  Pt has denied any chronic pain throughout shift so far, will re-add if needed.    Problem: Fluid Volume:  Goal: Maintenance of adequate hydration will improve  Maintenance of adequate hydration will improve   Outcome: Ongoing  Pt currently tolerating PO fluids and solid foods at this time.  IV fluids of NS at 174m/hr continue at this time with urine output remaining over 377mhr.    Problem: Urinary Elimination:  Goal: Skin integrity will improve  Skin integrity will improve   Outcome: Ongoing  Pt currently only has scattered bruises/abrasions and no other skin breakdown at this time.  Will continue to monitor.  Goal: Ability to recognize the need to void and respond appropriately will improve  Ability to recognize the need to void and respond appropriately will improve   Outcome: Ongoing  Pt has been able to void with some pain upon urethra site after surgery.  Pt has continued to have pink/cloudy urine that remains above 307mr and intermittent incontinence from bladder spasms and coughing.  Goal: Incidence of incontinence will decrease  Incidence of incontinence will decrease   Outcome: Ongoing  Pt has been experiencing incontinence  intermittently throughout night, usually from coughing and/or bladder spasms when they occur.  Otherwise pt is continent.    Comments: [facility] at this time.    Pt has an elevated risk for falls at this time, has been getting up without assistance from staff, which results in bed alarm being set off.  Pt refused bed alarm at beginning of shift, but with PRN Morphine and Ativan that was given, has had this on throughout the night.  Pt continues to set bed alarm off due to impulse to urinate coming on suddenly and has been educated on increased risk of falls without allowing staff to help her ambulate, pt verbalized understanding.  Pt also verbalized understanding of higher risks of falls with wearing non-slip socks at hospital, but declines using non-slip socks so far this shift.  Will continue to educate pt and attempt to have her compliant with this.    Care plan reviewed with patient this shift.  Patient verbalizes understanding of the plan of care and is contributing to goal setting.

## 2017-01-31 NOTE — Discharge Instructions (Signed)
As tolerated

## 2017-01-31 NOTE — Care Coordination-Inpatient (Signed)
DISCHARGE BARRIERS  01/31/17, 11:13 AM    Reason for Referral: HH at discharge per patient request  Mental Status: alert and oriented  Decision Making: patient is able to make her own decisions.   Family/Social/Home Environment: Patient is a 51 year old, widowed female. She resides with her brother in a 1 story house. She states that bathroom does not have any special safety features and she remains independent with her personal care as well as other activities of daily living. She follows with Francine Gravenourtney Baller at the [address] [facility] and states that she has transportation. She is aware that this is a [place] appointments.   Current Services: none per patient  Current Equipment: none  [place] Source[place] [employer]  Concerns or Barriers to Discharge: none indicated  Collabrative List of ECF/HH were provided: offered-patient stated that she wanted [facility] use [facility]    Teach Back Method used with patient regarding care plan  Patient verbalize understanding of the plan of care and contribute to goal setting.       Anticipated Needs/Discharge Plan: home as PTA with new referral to [facility].     [facility]. Spoke with patient and advised her that referral has been completed and that home health staff would be contacting her within 24-48 hours of discharge. Patient stated her understanding. [facility] brochure provided.     01/31/17, 12:34 PM    Discharge plan discussed by Case Manager and Social Worker.  Discharge plan reviewed with patient/ family.  Patient/ family verbalize understanding of discharge plan and are in agreement with plan.  Understanding was demonstrated using the teach back method.   Services After Discharge  Services At/After Discharge: [facility]         Electronically signed by Suszanne FinchBridgette L Elianys Conry, LSW on 01/31/2017 at 11:13 AM

## 2017-01-31 NOTE — Progress Notes (Signed)
Physician to Follow Referral: Theresa Comeraig Nicholson, MD    I certify that I, or a nurse practioner or physician assistant working with me, had an in-person encounter with Theresa Hobbs on 01/31/2017 and the reason for the home care services is documented in the clinical note for that day.      Theresa Hobbs was assessed on the above date and was found to have medical conditions requiring Home Care that included Vital sign monitoring and medication monitoring.    Homebound Status: further, I certify that my clinical findings support that Theresa Hobbs does meet the Medicare definition of "Confined to the Home" because of   Weakness and/or pain and activities are restricted or limited     Certification and medical necessity: I certify that, based on my findings, the following services are medically necessary home health services for Theresa Hobbs for the following reasons:  - Vital signs monitoring: Nurse to perform BP, HR, RR, Temp, and Weight with each visit and teach patient and caregivers on taking daily.  Nurse to call physician if pulse less than 50 or greater than 120, respiratory rate less than 12 or greater than 25, oral temperature greater than or equal to 101 oF, systolic BP less than 90 or greater than 170, diastolic BP less than 50 or greater than 100.  - Medication compliance and education for  new medications changes in previous medications

## 2017-01-31 NOTE — Discharge Summary (Signed)
Patient Identification  Theresa Hobbs is a 51 y.o. female.  DOB:  06/08/1966  Admit Date:  01/29/2017    Discharge date: 01/31/17    Disposition: home    Discharge Diagnoses:   Patient Active Problem List   Diagnosis   . Seizure (HCC)   . Head ache   . Nausea and vomiting   . Obesity   . Diarrhea   . Cancer of kidney (HCC)   . PTSD (post-traumatic stress disorder)   . Cancer (HCC)   . Chronic kidney disease   . Psychiatric problem   . Seizures (HCC)   . Lupus   . Depression   . Anxiety   . Fibromyalgia   . Post traumatic stress disorder (PTSD)   . S/P repair of ventral hernia   . Cellulitis of hand, right   . Hydronephrosis with urinary obstruction due to renal calculus   . Nephrolithiasis   . Tobacco abuse   . Left lower quadrant pain   . Ureteral stone         Subjective: Patient is resting in bed, voiding spontaneously, ambulating with assistance, tolerating regular diet, denies any nausea or vomiting. There are complaints of pain at this time, reports she is having bladder spasm type pain.    Objective:          Vitals:  BP (!) 123/59   Pulse 72   Temp 97.8 F (36.6 C) (Oral)   Resp 16   Wt 195 lb 4.8 oz (88.6 kg)   SpO2 98%   BMI 38.14 kg/m   Temp  Avg: 98.4 F (36.9 C)  Min: 97.8 F (36.6 C)  Max: 99 F (37.2 C)    Intake/Output Summary (Last 24 hours) at 01/31/17 0914  Last data filed at 01/31/17 0456   Gross per 24 hour   Intake          3410.19 ml   Output              402 ml   Net          3008.19 ml         Constitutional: Alert and oriented times x3, no acute distress, and cooperative to examination with appropriate mood and affect.   HEENT:   Head:         Normocephalic and atraumatic.          Mucous membranes are normal.   Eyes:         EOM are normal. No scleral icterus.  Nose:    The external appearance of the nose is normal  Ears:     The ears appear normal to external inspection.   Cardiovascular:       Normal rate, regular rhythm.  Pulmonary/Chest:  Normal respiratory rate and rhthym. No  use of accessory muscles. Lungs clear bilaterally.  Abdominal:          Soft. No tenderness. Active bowel sounds.  Musculoskeletal:    Normal range of motion. He exhibits no edema or tenderness of lower extremities.   Extremities:    No cyanosis, clubbing, or edema present.  Neurological:    Alert and oriented.     Labs:  WBC:    Lab Results   Component Value Date    WBC 11.1 01/30/2017     Hemoglobin/Hematocrit:  Lab Results   Component Value Date    HGB 14.8 01/30/2017    HCT 43.6 01/30/2017     BMP:  Lab  Results   Component Value Date    NA 143 01/30/2017    K 4.3 01/30/2017    CL 108 01/30/2017    CO2 23 01/30/2017    BUN 15 01/30/2017    LABALBU 4.8 01/29/2017    LABALBU 4.1 01/22/2011    CREATININE 0.9 01/30/2017    CALCIUM 8.7 01/30/2017    LABGLOM 66 01/30/2017       Discharge home  Doing well POD #1  Pain medication and Ditropan for bladder spasms    Consults: none    Surgery: cystoscopy with left ureteroscopy, basket retrieval of stone and placement of a left ureteral stent    Patient Instructions:     Activity:as tolerated, no driving while on analgesics.  Diet: As tolerated  Follow-up with Dr. Harle Stanford in 1 week for stent removal.    Hospital course: Patient under went cystoscopy with left ureteroscopy, basket retrieval of stone and placement of a left ureteral stent with no complications.  Post-operatively she remained stable. Patient is tolerating diet, urinating adequately on her own, pain is minimal, ambulating well with assistance, having flatus and BM.      Time spent for discharge 25 minutes    Martin Majestic, APRN  Urology

## 2017-01-31 NOTE — Plan of Care (Signed)
Problem: Discharge Planning:  Goal: Discharged to appropriate level of care  Discharged to appropriate level of care   Outcome: Completed Date Met: 01/31/17  Home with [facility]

## 2017-01-31 NOTE — Care Coordination-Inpatient (Signed)
01/31/17, 2:56 PM    DISCHARGE BARRIERS    [facility] are not able to locate insurance information on patient. [facility] and patient is not eligible for medicaid. Jenne with home care notified. They can do indigent application but, there is no guarantee that patient would be eligible. SW spoke with patient and advised her of insurance issues-she stated that she does not have [employer] (although she told SW earlier today that she did) and that she is on medicare. Card obtained and copy provided to [facility] with home care (in seeing patient). CM Tammy notified UR staff and SW notified Jenne with [facility].

## 2017-01-31 NOTE — Progress Notes (Addendum)
Came by for follow up visit with patient. Patient was not in room. There is a question as to whether she left the room to smoke. Will sign off for now as no other acute medical issues.  Please contact with questions. Initially thought that the patient had yeast in her urine however this was a culture from 2015. Discussed with Jennye [facility]Logan Smith.    Electronically signed by Frankey PootPatrice Orenthal Debski, MD on 01/31/2017 at 2:19 PM

## 2017-02-01 ENCOUNTER — Telehealth

## 2017-02-01 MED ORDER — OXYCODONE-ACETAMINOPHEN 7.5-325 MG PO TABS
ORAL_TABLET | ORAL | 0 refills | Status: DC | PRN
Start: 2017-02-01 — End: 2017-02-26

## 2017-02-01 NOTE — Telephone Encounter (Signed)
Patient is calling in again regarding this request, and she said she has left a couple of voicemails on the nurse line also. She is still having a lot of pain, her pharmacy is [facility], please call her and advise.

## 2017-02-01 NOTE — Telephone Encounter (Signed)
Patient just had a "cystoscopy with left ureteroscopy, basket retrieval of stone and placement of left ureteral stent" on Tuesday, she states that she is taking Oxybutynin, Flomax, antibiotics and taking Percocet( taking 1 every 6 hours, she tried 2 this morning at once and still no pain relief) she is drinking as much water as she can. She denies any fever or chills. Please advise!!

## 2017-02-01 NOTE — Telephone Encounter (Signed)
02/01/2017 Patient called office saying she just had surgery and got released yesterday.  Patient said the medicine that was prescribed for her is not helping for  The pain.  Patient said she is thinking about going back to ED.  Please call patient at [phone]

## 2017-02-01 NOTE — Telephone Encounter (Signed)
Patient called back explained note per [name], she will not have enough percocet to take every 4 hours. Per [name] she will call in more fore her.

## 2017-02-01 NOTE — Telephone Encounter (Addendum)
She can take one percocet every 4 hours. I will send more for her and increase the dose a little to the 7.5 mg tablets.    She is on all the medication we can give her. She needs to try using heating pads or taking warm baths.    She is maxed out on medication.    She had pain issues in the hospital as well.

## 2017-02-04 LAB — STONE ANALYSIS

## 2017-02-15 ENCOUNTER — Encounter: Attending: Urology | Primary: Family

## 2017-02-17 ENCOUNTER — Inpatient Hospital Stay: Admit: 2017-02-17 | Discharge: 2017-02-17 | Disposition: A | Discharge status: Home / Self Care | Payer: MEDICARE

## 2017-02-17 DIAGNOSIS — T161XXA Foreign body in right ear, initial encounter: Secondary | ICD-10-CM

## 2017-02-17 MED ORDER — ONDANSETRON 4 MG PO TBDP
4 MG | Freq: Once | ORAL | Status: AC
Start: 2017-02-17 — End: 2017-02-17
  Administered 2017-02-17: 19:00:00 4 mg via ORAL

## 2017-02-17 MED ORDER — NEOMYCIN-POLYMYXIN-HC 1 % OT SOLN
1 % | Freq: Three times a day (TID) | OTIC | 1 refills | Status: DC
Start: 2017-02-17 — End: 2017-02-26

## 2017-02-17 MED ORDER — MECLIZINE HCL 25 MG PO CHEW
25 MG | Freq: Once | ORAL | Status: AC
Start: 2017-02-17 — End: 2017-02-17
  Administered 2017-02-17: 19:00:00 25 mg via ORAL

## 2017-02-17 MED FILL — ONDANSETRON 4 MG PO TBDP: 4 MG | ORAL | Qty: 1

## 2017-02-17 MED FILL — TRAVEL SICKNESS 25 MG PO CHEW: 25 MG | ORAL | Qty: 1

## 2017-02-17 NOTE — ED Provider Notes (Addendum)
[facility]  eMERGENCY dEPARTMENT eNCOUnter          CHIEF COMPLAINT       Chief Complaint   Patient presents with   . Foreign Body in Ear       Nurses Notes reviewed and I agree except as noted in the HPI.      HISTORY OF PRESENT ILLNESS    Theresa Hobbs is a 51 y.o. female who presents foreign body in ear. Patient reports that she has had a "piece of tissue" in her right ear today. She states that she has been unable to get this out by flushing with water. She states that she is having associated aching/sharp pain in her right ear at a 5/10 in severity. Denies radiation of pain or hearing loss. Patient denies any fevers, chills, nausea, vomiting or diarrhea. Patient denies any chest pain or shortness of breath.   Patient denies any further complaints at initial encounter.     Location/Symptom: Right ear foreign body  Timing/Onset: Today  Context/Setting: piece of tissue in right ear  Quality: Aching/sharp  Duration: Acute  Modifying Factors: None  Severity: 5/10  REVIEW OF SYSTEMS     Review of Systems   Constitutional: Negative for appetite change, chills, fatigue and fever.   HENT: Positive for ear pain (foreign body). Negative for congestion, rhinorrhea and sore throat.    Eyes: Negative for pain, discharge and visual disturbance.   Respiratory: Negative for cough, shortness of breath and wheezing.    Cardiovascular: Negative for chest pain, palpitations and leg swelling.   Gastrointestinal: Negative for abdominal pain, diarrhea, nausea and vomiting.   Genitourinary: Negative for difficulty urinating, dysuria and vaginal discharge.   Musculoskeletal: Negative for arthralgias, back pain, joint swelling and neck pain.   Skin: Negative for pallor and rash.   Neurological: Negative for dizziness, syncope, weakness, light-headedness and headaches.   Hematological: Negative for adenopathy.   Psychiatric/Behavioral: Negative for confusion, dysphoric mood and suicidal ideas. The patient is not  nervous/anxious.         PAST MEDICAL HISTORY    has a past medical history of Anxiety; Asthma; Cancer (HCC); Chronic kidney disease; Depression; Diabetes mellitus (HCC); Fibromyalgia; HTN (hypertension); Lupus; MRSA (methicillin resistant Staphylococcus aureus); Post traumatic stress disorder (PTSD); Psychiatric problem; and Seizures (HCC).    SURGICAL HISTORY      has a past surgical history that includes Cholecystectomy (2000); Nose surgery (2007); Hysterectomy (1998); Colonoscopy (2004); Upper gastrointestinal endoscopy (2004); Abdominal hernia repair (02-07-13); other surgical history (Right, 11/26/2015); and pr office/outpt visit,procedure only (N/A, 01/30/2017).    CURRENT MEDICATIONS       Discharge Medication List as of 02/17/2017  2:31 PM      CONTINUE these medications which have NOT CHANGED    Details   oxyCODONE-acetaminophen (PERCOCET) 7.5-325 MG per tablet Take 1 tablet by mouth every 4 hours as needed for Pain for up to 28 days. Intended supply: 28 days., Disp-20 tablet, R-0Normal      flavoxate (URISPAS) 100 MG tablet Take 1 tablet by mouth 3 times daily as needed (bladder spasms), Disp-30 tablet, R-1Normal      oxybutynin (DITROPAN) 5 MG tablet Take 1 tablet by mouth every 8 hours as needed (stent pain, bladder spasms), Disp-30 tablet, R-1Normal      albuterol sulfate HFA 108 (90 Base) MCG/ACT inhaler Inhale 2 puffs into the lungs every 6 hours as needed for WheezingHistorical Med      estrogens, conjugated, (PREMARIN) 0.9  MG tablet Take 1 tablet by mouth daily, Disp-21 tablet, R-3Normal      pramipexole (MIRAPEX) 0.25 MG tablet Take 1 tablet by mouth nightly, Disp-90 tablet, R-3Normal      omeprazole (PRILOSEC) 40 MG delayed release capsule Take 40 mg by mouth dailyHistorical Med      traZODone (DESYREL) 100 MG tablet Take 200 mg by mouth nightlyHistorical Med      venlafaxine (EFFEXOR-XR) 150 MG XR capsule Take 150 mg by mouth daily.             ALLERGIES     is allergic to imitrex [sumatriptan]; iv  dye [iodides]; amoxicillin-pot clavulanate; azithromycin; bactrim; cefuroxime axetil; ciprofloxacin; erythromycin; flagyl [metronidazole]; ketorolac tromethamine; klonopin [clonazepam]; levofloxacin; nubain [nalbuphine hcl]; other; pcn [penicillins]; reglan [metoclopramide]; sulfa antibiotics; and tramadol.    FAMILY HISTORY     is adopted.    family history includes Cancer in her father and mother; Osteoporosis in her mother. She was adopted.    SOCIAL HISTORY      reports that she has been smoking Cigarettes.  She has a 0.60 pack-year smoking history. She has never used smokeless tobacco. She reports that she uses drugs, including Marijuana. She reports that she does not drink alcohol.    PHYSICAL EXAM     INITIAL VITALS:  height is 5' (1.524 m) and weight is 187 lb (84.8 kg). Her oral temperature is 98.2 F (36.8 C). Her blood pressure is 116/67 and her pulse is 92. Her respiration is 18 and oxygen saturation is 97%.    Physical Exam   Constitutional: She is oriented to person, place, and time. She appears well-developed and well-nourished.   HENT:   Head: Normocephalic and atraumatic.   Right Ear: Tympanic membrane and external ear normal. A foreign body is present.   Left Ear: Tympanic membrane and external ear normal.   Nose: Nose normal.   Mouth/Throat: Oropharynx is clear and moist and mucous membranes are normal. No posterior oropharyngeal erythema.   Eyes: Conjunctivae are normal. Right eye exhibits no discharge. Left eye exhibits no discharge. No scleral icterus.   Neck: Normal range of motion. Neck supple. No JVD present.   Cardiovascular: Regular rhythm and normal heart sounds.  Tachycardia present.  Exam reveals no gallop and no friction rub.    No murmur heard.  Pulmonary/Chest: Effort normal and breath sounds normal. No respiratory distress. She has no decreased breath sounds. She has no wheezes. She has no rhonchi. She has no rales.   Abdominal: Soft. She exhibits no distension. There is no  tenderness. There is no rebound and no guarding.   Musculoskeletal: Normal range of motion. She exhibits no edema.   Neurological: She is alert and oriented to person, place, and time. She exhibits normal muscle tone. She displays no seizure activity. GCS eye subscore is 4. GCS verbal subscore is 5. GCS motor subscore is 6.   Skin: Skin is warm and dry. No rash noted. She is not diaphoretic.   Psychiatric: She has a normal mood and affect. Her behavior is normal. Thought content normal.   Nursing note and vitals reviewed.        DIFFERENTIAL DIAGNOSIS:   Foreign body    DIAGNOSTIC RESULTS     EKG: All EKG's are interpreted by the Emergency Department Physician who either signs or Co-signs this chart in the absence of a cardiologist.  None    RADIOLOGY: non-plain film images(s) such as CT, Ultrasound and MRI are read by the  radiologist.  No orders to display           LABS:   Labs Reviewed - No data to display    EMERGENCY DEPARTMENT COURSE:   Vitals:    Vitals:    02/17/17 1415 02/17/17 1416   BP: 116/67    Pulse: 126 92   Resp: 18    Temp: 98.2 F (36.8 C)    TempSrc: Oral    SpO2: 97%    Weight: 187 lb (84.8 kg)    Height: 5' (1.524 m)      Patient was seen history physical exam was performed.  See disposition below    2:17 PM Patient was seen and evaluated in a timely fashion.     Discussed diagnosis and plan with patient.  Questions addressed.     Patient was seen and evaluated by me at bedside. Patient did not appear in any distress. History and physical exam were obtained. Foreign body removed by me. The patient was comfortable with the plan of discharge home and to follow up with PCP as discussed. Anticipatory guidance was given. The patient is instructed to return to the emergency department for any worsening or otherwise change in their symptoms. Patient discharged from the emergency department in good condition with all questions answered. See disposition below.      CRITICAL CARE:    None    CONSULTS:  None    PROCEDURES:  Foreign Body  Date/Time: 02/17/2017 2:27 PM  Performed by: Clide DeutscherBOROFF, Blu Lori R  Authorized by: Clide DeutscherBOROFF, Trini Soldo R     Consent:     Consent obtained:  Verbal    Consent given by:  Patient    Risks discussed:  Bleeding, infection, pain and incomplete removal  Location:     Location:  Ear    Ear location:  R ear    Tendon involvement:  None  Pre-procedure details:     Imaging:  None  Anesthesia (see MAR for exact dosages):     Anesthesia method:  None  Procedure type:     Procedure complexity:  Simple  Procedure details:     Localization method:  Visualized    Dissection of underlying tissues: no      Bloodless field: yes      Removal mechanism:  Irrigation    Foreign bodies recovered:  2    Intact foreign body removal: yes    Post-procedure details:     Neurovascular status: intact      Confirmation:  No additional foreign bodies on visualization    Skin closure:  None    Dressing:  Open (no dressing)    Patient tolerance of procedure:  Tolerated well, no immediate complications          FINAL IMPRESSION      1. FB ear, right, initial encounter          DISPOSITION/PLAN   Patient was discharged in stable condition. Will return if symptoms change or worsen, or for any sign or symptom deemed emergent by the patient or family members. Follow up as an outpatient, sooner if symptoms warrant.       PATIENT REFERRED TO:  Nils Flackourtney E Baller, APRN - CNP  [address]  [place] [id number]  [phone]    In 1 week        DISCHARGE MEDICATIONS:  Discharge Medication List as of 02/17/2017  2:31 PM      START taking these medications    Details  neomycin-polymyxin-hydrocortisone (CORTISPORIN) 1 % SOLN otic solution Place 2 drops into the right ear every 8 hours for 10 days, Disp-1 Bottle, R-1Print             (Please note that portions of this note were completed with a voice recognition program.  Efforts were made to edit the dictations but occasionally words are mis-transcribed.)    Scribe:  Lenard Simmer 02/17/17 2:17 PM Scribing for and in the presence of [employer].    Signed by: Lenard Simmer, Scribe, 02/17/17 3:37 PM    Provider:  I personally performed the services described in the documentation, reviewed and edited the documentation which was dictated to the scribe in my presence, and it accurately records my words and actions.    Riki Rusk [facility] PA-C 02/17/17 3:37 PM        Hinda Glatter, PA  02/17/17 [address] [place], [place]  02/17/17 1537

## 2017-02-17 NOTE — ED Triage Notes (Signed)
Pt presents to ED with c/o foreign body in Rt ear. Pt states she attempted to put toilet tissue in Rt ear to attempt to sleep in jail. Pt states pain is 5/10 at this time. Pt states she attempted to flush ear out with water, but feels as if she "expanded it."

## 2017-02-18 ENCOUNTER — Inpatient Hospital Stay: Admit: 2017-02-18 | Discharge: 2017-02-19 | Disposition: A | Discharge status: Home / Self Care | Payer: MEDICARE

## 2017-02-18 ENCOUNTER — Emergency Department: Admit: 2017-02-18 | Payer: MEDICARE | Primary: Family

## 2017-02-18 DIAGNOSIS — T8384XA Pain from genitourinary prosthetic devices, implants and grafts, initial encounter: Secondary | ICD-10-CM

## 2017-02-18 LAB — CBC WITH AUTO DIFFERENTIAL
Basophils Absolute: 0.1 10*3/uL (ref 0.0–0.1)
Basophils: 0.8 %
Eosinophils Absolute: 0.2 10*3/uL (ref 0.0–0.4)
Eosinophils: 1.9 %
Hematocrit: 42.6 % (ref 37.0–47.0)
Hemoglobin: 14.4 gm/dl (ref 12.0–16.0)
Lymphocytes Absolute: 3.3 10*3/uL (ref 1.0–4.8)
Lymphocytes: 25.7 %
MCH: 29.2 pg (ref 27.0–31.0)
MCHC: 33.7 gm/dl (ref 33.0–37.0)
MCV: 86.7 fL (ref 81.0–99.0)
MPV: 10.3 fL (ref 7.4–10.4)
Monocytes Absolute: 0.7 10*3/uL (ref 0.4–1.3)
Monocytes: 5.2 %
Platelets: 256 10*3/uL (ref 130–400)
RBC: 4.91 10*6/uL (ref 4.20–5.40)
RDW: 14.6 % — ABNORMAL HIGH (ref 11.5–14.5)
Seg Neutrophils: 66.4 %
Segs Absolute: 8.5 10*3/uL — ABNORMAL HIGH (ref 1.8–7.7)
WBC: 12.8 10*3/uL — ABNORMAL HIGH (ref 4.8–10.8)
nRBC: 0 /100 wbc

## 2017-02-18 LAB — URINE WITH REFLEXED MICRO
CASTS 2: NONE SEEN /lpf
Crystals, UA: NONE SEEN
Glucose, Ur: NEGATIVE mg/dl
MISCELLANEOUS 2: NONE SEEN
Nitrite, Urine: NEGATIVE
Protein, UA: 300 — AB
RBC, UA: >200 /hpf (ref 0–?)
Renal Epithelial, UA: NONE SEEN
Specific Gravity, Urine: 1.025 (ref 1.002–1.03)
Urobilinogen, Urine: 1 eu/dl (ref 0.0–1.0)
Yeast, UA: NONE SEEN
pH, UA: 6 (ref 5.0–9.0)

## 2017-02-18 LAB — BILE ACIDS, TOTAL: Ictotest: NEGATIVE

## 2017-02-18 MED ORDER — MORPHINE SULFATE 2 MG/ML IJ SOLN
2 MG/ML | Freq: Once | INTRAMUSCULAR | Status: AC
Start: 2017-02-18 — End: 2017-02-18
  Administered 2017-02-18: 2 mg via INTRAVENOUS

## 2017-02-18 MED ORDER — ONDANSETRON HCL 4 MG/2ML IJ SOLN
4 MG/2ML | Freq: Once | INTRAMUSCULAR | Status: AC
Start: 2017-02-18 — End: 2017-02-18
  Administered 2017-02-18: 4 mg via INTRAVENOUS

## 2017-02-18 MED FILL — ONDANSETRON HCL 4 MG/2ML IJ SOLN: 4 MG/2ML | INTRAMUSCULAR | Qty: 2

## 2017-02-18 MED FILL — MORPHINE SULFATE 2 MG/ML IJ SOLN: 2 mg/mL | INTRAMUSCULAR | Qty: 1

## 2017-02-18 NOTE — ED Provider Notes (Signed)
[facility]      CHIEF COMPLAINT       Chief Complaint   Patient presents with   . Flank Pain     left       Nurses Notes reviewed and I agree except as noted in the HPI.      HISTORY OF PRESENT ILLNESS    Theresa Hobbs is a 51 y.o. female who presents For evaluation of left back, flank, and abdominal pain.  Patient was seen in the ER and admitted to the hospital on May 28 for left ureterolithiasis.  On May 29 Dr. Harle Stanford removed stones and placed a ureteral stent.  On May 30 patient was discharged home to follow-up in one week for stent removal.  Patient states she called and canceled her appointment as she could not make it.  She states she had been doing quite well until 3 days ago when she developed pain in her left low back radiating to the flank and left abdomen.  She describes the pain as "spasms."  She also has developed hematuria, nausea with 2 episodes of vomiting today, and low-grade fever of 99.  She has been eating and drinking well prior to today and has no other complaints except as mentioned above.    Location/Symptom: Left back, flank, and abdominal pain  Timing/Onset: 3 days ago  Context/Setting: Patient has left ureteral stent and canceled appointment to have it removed.  Quality: Spasms  Duration: Intermittent  Modifying Factors: Worse with certain movements and urination and prior to today meditation had been helping  Severity: Moderate    REVIEW OF SYSTEMS     Review of Systems   Constitutional: Negative for activity change, appetite change, chills and fever.   HENT: Negative for congestion, ear pain, rhinorrhea and sore throat.    Eyes: Negative for photophobia.   Respiratory: Negative for cough and shortness of breath.    Cardiovascular: Negative for chest pain.   Gastrointestinal: Positive for abdominal pain, nausea and vomiting. Negative for diarrhea.        No incontinence of bowel    Endocrine: Negative for polyuria.   Genitourinary: Positive for flank pain and hematuria.  Negative for difficulty urinating, dysuria and frequency.        No incontinence of bladder   Musculoskeletal: Positive for back pain. Negative for gait problem and neck pain.   Skin: Negative for rash.   Neurological: Negative for dizziness, weakness and numbness.   Psychiatric/Behavioral: Negative for confusion and sleep disturbance.        PAST MEDICAL HISTORY    has a past medical history of Anxiety; Asthma; Cancer (HCC); Chronic kidney disease; Depression; Diabetes mellitus (HCC); Fibromyalgia; HTN (hypertension); Lupus; MRSA (methicillin resistant Staphylococcus aureus); Post traumatic stress disorder (PTSD); Psychiatric problem; and Seizures (HCC).    SURGICAL HISTORY      has a past surgical history that includes Cholecystectomy (2000); Nose surgery (2007); Hysterectomy (1998); Colonoscopy (2004); Upper gastrointestinal endoscopy (2004); Abdominal hernia repair (02-07-13); other surgical history (Right, 11/26/2015); and pr office/outpt visit,procedure only (N/A, 01/30/2017).    CURRENT MEDICATIONS       Previous Medications    ALBUTEROL SULFATE HFA 108 (90 BASE) MCG/ACT INHALER    Inhale 2 puffs into the lungs every 6 hours as needed for Wheezing    ESTROGENS, CONJUGATED, (PREMARIN) 0.9 MG TABLET    Take 1 tablet by mouth daily    FLAVOXATE (URISPAS) 100 MG TABLET    Take 1 tablet  by mouth 3 times daily as needed (bladder spasms)    NEOMYCIN-POLYMYXIN-HYDROCORTISONE (CORTISPORIN) 1 % SOLN OTIC SOLUTION    Place 2 drops into the right ear every 8 hours for 10 days    OMEPRAZOLE (PRILOSEC) 40 MG DELAYED RELEASE CAPSULE    Take 40 mg by mouth daily    OXYBUTYNIN (DITROPAN) 5 MG TABLET    Take 1 tablet by mouth every 8 hours as needed (stent pain, bladder spasms)    OXYCODONE-ACETAMINOPHEN (PERCOCET) 7.5-325 MG PER TABLET    Take 1 tablet by mouth every 4 hours as needed for Pain for up to 28 days. Intended supply: 28 days.    PRAMIPEXOLE (MIRAPEX) 0.25 MG TABLET    Take 1 tablet by mouth nightly    TRAZODONE  (DESYREL) 100 MG TABLET    Take 200 mg by mouth nightly    VENLAFAXINE (EFFEXOR-XR) 150 MG XR CAPSULE    Take 150 mg by mouth daily.       ALLERGIES     is allergic to imitrex [sumatriptan]; iv dye [iodides]; amoxicillin-pot clavulanate; azithromycin; bactrim; cefuroxime axetil; ciprofloxacin; erythromycin; flagyl [metronidazole]; ketorolac tromethamine; klonopin [clonazepam]; levofloxacin; nubain [nalbuphine hcl]; other; pcn [penicillins]; reglan [metoclopramide]; sulfa antibiotics; and tramadol.    FAMILY HISTORY     is adopted.    family history includes Cancer in her father and mother; Osteoporosis in her mother. She was adopted.    SOCIAL HISTORY      reports that she has been smoking Cigarettes.  She has a 0.60 pack-year smoking history. She has never used smokeless tobacco. She reports that she uses drugs, including Marijuana. She reports that she does not drink alcohol.    PHYSICAL EXAM     INITIAL VITALS:  height is 5' (1.524 m) and weight is 185 lb (83.9 kg). Her oral temperature is 99.4 F (37.4 C). Her blood pressure is 144/87 (abnormal) and her pulse is 52. Her respiration is 18 and oxygen saturation is 95%.    Physical Exam   Constitutional: She is oriented to person, place, and time. Vital signs are normal. She appears well-developed and well-nourished. She is active and cooperative.  Non-toxic appearance. No distress.   HENT:   Head: Normocephalic and atraumatic.   Right Ear: Hearing normal.   Left Ear: Hearing normal.   Nose: Nose normal. No rhinorrhea. No epistaxis.   Mouth/Throat: Uvula is midline, oropharynx is clear and moist and mucous membranes are normal.   Eyes: Conjunctivae and EOM are normal. Pupils are equal, round, and reactive to light. No scleral icterus.   Neck: No JVD present. No neck rigidity. No tracheal deviation present.   Cardiovascular: Normal rate, regular rhythm and normal heart sounds.    Pulses:       Dorsalis pedis pulses are 2+ on the right side, and 2+ on the left side.         Posterior tibial pulses are 2+ on the right side, and 2+ on the left side.   Pulmonary/Chest: Effort normal and breath sounds normal. No respiratory distress. She has no decreased breath sounds. She has no wheezes.   Abdominal: Soft. Normal appearance and bowel sounds are normal. She exhibits no distension and no pulsatile midline mass. There is tenderness in the left upper quadrant and left lower quadrant. There is no rigidity, no rebound, no guarding, no CVA tenderness, no tenderness at McBurney's point and negative Murphy's sign. No hernia.       Musculoskeletal: Normal range of motion.  Thoracic back: She exhibits tenderness (Mid left).        Lumbar back: She exhibits tenderness (Nonspecific and diffuse to light palpation).        Back:    Well perfused; good strength appreciated in all muscle groups; there is good plantar and dorsiflexion of the feet and toes. SLR is negative in the supine position.   Lymphadenopathy:     She has no cervical adenopathy.   Neurological: She is alert and oriented to person, place, and time. She has normal strength. No sensory deficit. Coordination and gait normal. GCS eye subscore is 4. GCS verbal subscore is 5. GCS motor subscore is 6.   There is no saddle anesthesia.   Skin: Skin is warm, dry and intact. No rash noted. She is not diaphoretic. No pallor.   Psychiatric: She has a normal mood and affect. Her speech is normal and behavior is normal. Thought content normal. Cognition and memory are normal.   Nursing note and vitals reviewed.      DIFFERENTIAL DIAGNOSIS:   Including but not limited to: Ureteral stent displacement, pyelonephritis, renal colic, lumbar strain    DIAGNOSTIC RESULTS     RADIOLOGY: non-plain film images(s) such as CT, Ultrasound and MRI are read by the radiologist.  Plain radiographic images are visualized and preliminarily interpreted by the emergency physician unless otherwise stated below.  CT ABDOMEN PELVIS WO CONTRAST Additional  Contrast? None   Final Result       1. Left ureteral stent in place.   2. No hydronephrosis. No definite renal calculi.               **This report has been created using voice recognition software. It may contain minor errors which are inherent in voice recognition technology.**      Final report electronically signed by Dr. Jacques Navy on 02/18/2017 8:18 PM          LABS:   Labs Reviewed   CBC WITH AUTO DIFFERENTIAL - Abnormal; Notable for the following:        Result Value    WBC 12.8 (*)     RDW 14.6 (*)     Segs Absolute 8.5 (*)     All other components within normal limits   COMPREHENSIVE METABOLIC PANEL - Abnormal; Notable for the following:     Glucose 145 (*)     All other components within normal limits   URINE WITH REFLEXED MICRO - Abnormal; Notable for the following:     Bilirubin Urine MODERATE (*)     Ketones, Urine TRACE (*)     Blood, Urine LARGE (*)     Protein, UA 300 (*)     Leukocyte Esterase, Urine LARGE (*)     Color, UA RED (*)     All other components within normal limits   ANION GAP - Abnormal; Notable for the following:     Anion Gap 18.0 (*)     All other components within normal limits   GLOMERULAR FILTRATION RATE, ESTIMATED - Abnormal; Notable for the following:     Est, Glom Filt Rate 52 (*)     All other components within normal limits   URINE CULTURE, REFLEXED    Narrative:     Source: urine, clean catch       Site:           Current Antibiotics: not stated   URINE DRUG SCREEN   BILE ACIDS, TOTAL   OSMOLALITY  EMERGENCY DEPARTMENT COURSE:   Vitals:    Vitals:    02/18/17 1906 02/18/17 1942   BP: (!) 148/80 (!) 144/87   Pulse: 50 52   Resp: 18 18   Temp: 99.4 F (37.4 C)    TempSrc: Oral    SpO2: 100% 95%   Weight: 185 lb (83.9 kg)    Height: 5' (1.524 m)      The patient was seen and evaluated within the ED today for left back, flank, abdominal pain and hematuria. On exam, I appreciated diffuse reproducible tenderness to light palpation of the low back, left flank, and left  abdomen.  Old records were reviewed.  Within the department, I observed the patient's vital signs to be within acceptable range. Radiological studies within the department revealed in place ureteral stent with no complications or abnormality. Laboratory work was reassuring, showing mild leukocytosis of 12.8, urinalysis with hematuria and leukocytes. Within the department, the patient was treated with IV fluids, morphine, Zofran, and Compazine. I observed the patient's condition to modestly improve during the duration of their stay.  Patient's symptoms are resolved. On re-exam the patient's abdomen is soft and non-tender, with no peritoneal signs. Vital signs have remained stable. The patient remains non-toxic appearing with no distress evident in the ER.  I have consulted Charlette CaffeyValerie Pingle NP from urology.  She has reviewed patient's presentation, old records, and ER workup with me.  She advises that if patient's pain is controlled she can be discharged home on doxycycline to follow-up in the office tomorrow for ureteral stent removal.  The patient is comfortable with this plan.  I have considered  ureteral stent displacement or occlusion and this patient's presentation is not consistent with such entities and therefore, no further testing was warranted. The patient was comfortable with the plan of discharge home and to follow up with Dr. Harle StanfordNicholson. Anticipatory guidance was given. The patient is instructed to return to the emergency department for any worsening of their symptoms. Patient was discharged from the emergency department in good condition with all questions answered. See disposition below. I have given the patient strict written and verbal instructions about care at home, follow-up, and signs and symptoms of worsening of condition and they did verbalize understanding.      CRITICAL CARE:   None    CONSULTS:  Charlette CaffeyValerie Pingle NP (urology)    PROCEDURES:  None    FINAL IMPRESSION      1. Flank pain    2. Pain  due to ureteral stent, initial encounter (HCC)    3. Hematuria, unspecified type          DISPOSITION/PLAN     1. Flank pain    2. Pain due to ureteral stent, initial encounter (HCC)    3. Hematuria, unspecified type        PATIENT REFERRED TO:  [facility]  [address]  [address]  [place] [id number]  [phone]  Schedule an appointment as soon as possible for a visit         DISCHARGE MEDICATIONS:  New Prescriptions    DOXYCYCLINE MONOHYDRATE (ADOXA) 100 MG TABLET    Take 1 tablet by mouth 2 times daily for 10 days       (Please note that portions of this note were completed with a voice recognition program.  Efforts were made to edit the dictations but occasionally words are mis-transcribed.)    Provider:  I personally performed the services described in the  documentation, reviewed and edited the documentation which was dictated to the scribe in my presence, and it accurately records my words and actions.    Joyceline Maiorino, PA-C 02/18/17 9:42 PM    Shellia Carwin, PA-C        Shellia Carwin, [place]  02/18/17 2144

## 2017-02-18 NOTE — ED Triage Notes (Signed)
Pt comes to the ED for left flank pain and hematuria.  This started last Friday.  Pt describes the pain as cramping and throbbing.  Pt was supposed to get a stent out on the left last Thursday but was unable to make the appt.

## 2017-02-18 NOTE — Progress Notes (Signed)
Pharmacy Note  ED Culture Follow-up    Theresa Hobbs is a [age] y.o. female.     Allergies: Imitrex [sumatriptan]; Iv dye [iodides]; Amoxicillin-pot clavulanate; Azithromycin; Bactrim; Cefuroxime axetil; Ciprofloxacin; Erythromycin; Flagyl [metronidazole]; Ketorolac tromethamine; Klonopin [clonazepam]; Levofloxacin; Nubain [nalbuphine hcl]; Other; Pcn [penicillins]; Reglan [metoclopramide]; Sulfa antibiotics; and Tramadol     Labs:  Lab Results   Component Value Date    BUN 17 02/18/2017    CREATININE 1.1 02/18/2017    WBC 12.8 (H) 02/18/2017     Estimated Creatinine Clearance: 58 mL/min (based on SCr of 1.1 mg/dL).    Current antimicrobials:    doxycycline    ASSESSMENT:  Micro results:   Urine culture: positive for enterococcus      PLAN:  Need for intervention: No 2/2 on doxy   Discussed with: Dr. Lily PeerEldirani MD   Chosen treatment:     Patient already on appropriate treatment as above    Patient response:    No need to contact patient    Called/sent in prescription to: Not applicable    Please call with any questions. Ext. [phone]    Theresa Hobbs, PharmD 5:43 PM 02/23/2017

## 2017-02-18 NOTE — ED Notes (Signed)
Pt updated on plan of care, no questions at this time. IV medications given per MAR.     Darrall DearsSarah Kayah Hecker, RN  02/18/17 1943

## 2017-02-19 LAB — URINE DRUG SCREEN
Amphetamine+Methamphetamine Urine Screen: POSITIVE
Barbiturate Quant, Ur: NEGATIVE
Benzodiazepine Quant, Ur: NEGATIVE
Cannabinoid Quant, Ur: POSITIVE
Cocaine Metab Quant, Ur: NEGATIVE
Opiates, Urine: NEGATIVE
Oxycodone: NEGATIVE
PCP Quant, Ur: POSITIVE

## 2017-02-19 LAB — COMPREHENSIVE METABOLIC PANEL
ALT: 23 U/L (ref 11–66)
AST: 19 U/L (ref 5–40)
Albumin: 4.2 g/dL (ref 3.5–5.1)
Alkaline Phosphatase: 103 U/L (ref 38–126)
BUN: 17 mg/dL (ref 7–22)
CO2: 23 meq/L (ref 23–33)
Calcium: 9.4 mg/dL (ref 8.5–10.5)
Chloride: 102 meq/L (ref 98–111)
Creatinine: 1.1 mg/dL (ref 0.4–1.2)
Glucose: 145 mg/dL — ABNORMAL HIGH (ref 70–108)
Potassium: 3.6 meq/L (ref 3.5–5.2)
Sodium: 143 meq/L (ref 135–145)
Total Bilirubin: 0.3 mg/dL (ref 0.3–1.2)
Total Protein: 6.6 g/dL (ref 6.1–8.0)

## 2017-02-19 LAB — OSMOLALITY: Osmolality Calc: 289.1 mOsmol/kg (ref >=275.0)

## 2017-02-19 LAB — ANION GAP: Anion Gap: 18 meq/L — ABNORMAL HIGH (ref 8.0–16.0)

## 2017-02-19 LAB — GLOMERULAR FILTRATION RATE, ESTIMATED: Est, Glom Filt Rate: 52 mL/min/{1.73_m2} — AB

## 2017-02-19 MED ORDER — DOXYCYCLINE HYCLATE 100 MG PO TABS
100 MG | Freq: Once | ORAL | Status: AC
Start: 2017-02-19 — End: 2017-02-18
  Administered 2017-02-19: 02:00:00 100 mg via ORAL

## 2017-02-19 MED ORDER — PROCHLORPERAZINE EDISYLATE 5 MG/ML IJ SOLN
5 MG/ML | Freq: Once | INTRAMUSCULAR | Status: AC
Start: 2017-02-19 — End: 2017-02-18
  Administered 2017-02-19: 01:00:00 10 mg via INTRAVENOUS

## 2017-02-19 MED ORDER — DOXYCYCLINE MONOHYDRATE 100 MG PO TABS
100 MG | ORAL_TABLET | Freq: Two times a day (BID) | ORAL | 0 refills | Status: DC
Start: 2017-02-19 — End: 2017-02-26

## 2017-02-19 MED ORDER — MORPHINE SULFATE 4 MG/ML IJ SOLN
4 MG/ML | Freq: Once | INTRAMUSCULAR | Status: AC
Start: 2017-02-19 — End: 2017-02-18
  Administered 2017-02-19: 01:00:00 4 mg via INTRAVENOUS

## 2017-02-19 MED FILL — PROCHLORPERAZINE EDISYLATE 5 MG/ML IJ SOLN: 5 MG/ML | INTRAMUSCULAR | Qty: 2

## 2017-02-19 MED FILL — MORPHINE SULFATE 4 MG/ML IJ SOLN: 4 mg/mL | INTRAMUSCULAR | Qty: 1

## 2017-02-19 MED FILL — DOXYCYCLINE HYCLATE 100 MG PO TABS: 100 MG | ORAL | Qty: 1

## 2017-02-20 ENCOUNTER — Telehealth

## 2017-02-20 NOTE — Telephone Encounter (Signed)
I will send Oxybutynin.    We have given her 30 narcotics since stent was placed. If she would have made her appointment this should have been more than enough. This is more than we typically give anyone anyway.    She is allergic to everything but Percocet so I am unable to sent Ultram or Toradol.    I can send Naproxen if she wishes.    Oxybutynin sent. Let me know about Naproxen. Thanks

## 2017-02-20 NOTE — Telephone Encounter (Signed)
Patient called and wants pain medication she said she can not do this anymore. She was a no show for her appointment and is now scheduled for 6/25 for stent removal.

## 2017-02-21 MED ORDER — OXYBUTYNIN CHLORIDE 5 MG PO TABS
5 MG | ORAL_TABLET | Freq: Three times a day (TID) | ORAL | 0 refills | Status: DC | PRN
Start: 2017-02-21 — End: 2017-02-26

## 2017-02-21 NOTE — Telephone Encounter (Signed)
LM for patient to return call to office regarding Theresa Hobbs's note.

## 2017-02-21 NOTE — Telephone Encounter (Signed)
Patient returned call.  Informed of Theresa Hobbs response.  She said she already had oxybutynin and her allergy issues.  She hung up before I could say anything about the naproxen.

## 2017-02-21 NOTE — Telephone Encounter (Signed)
This is just for your information.     Thank you.

## 2017-02-21 NOTE — Telephone Encounter (Signed)
Pt called back, please call

## 2017-02-22 LAB — URINE CULTURE, REFLEXED

## 2017-02-26 ENCOUNTER — Ambulatory Visit: Admit: 2017-02-26 | Discharge: 2017-02-26 | Payer: MEDICARE | Attending: Urology | Primary: Family

## 2017-02-26 DIAGNOSIS — N200 Calculus of kidney: Secondary | ICD-10-CM

## 2017-02-26 LAB — POCT URINALYSIS DIPSTICK W/O MICROSCOPE (AUTO)
Bilirubin Urine: NEGATIVE
Glucose, Ur: NEGATIVE mg/dl
Ketones, Urine: NEGATIVE
Nitrite, Urine: NEGATIVE
Protein, Urine: 30 mg/dl — AB
Specific Gravity, Urine: 1.015 (ref 1.002–1.03)
Urobilinogen, Urine: 0.2 eu/dl (ref 0.0–1.0)
pH, Urine: 7 (ref 5.0–9.0)

## 2017-02-26 MED ORDER — OXYBUTYNIN CHLORIDE 5 MG PO TABS
5 MG | ORAL_TABLET | Freq: Three times a day (TID) | ORAL | 0 refills | Status: DC | PRN
Start: 2017-02-26 — End: 2017-04-18

## 2017-02-26 NOTE — Progress Notes (Signed)
Cystoscopy with Stent Removal Note:   The patient was prepared and draped in the usual fashion. The 16 french flexible cystoscope was introduced under vision into the bladder.  The indwelling stent was grasped and withdrawn intact. The patient tolerated the procedure well, there were no complications. 500 mg of Ciprofloxacin PO was given at the time of the procedure.      Assessment:      Theresa Hobbs was seen in follow up for stent removal after recent ureteroscopy.  This was done without difficulty.       Plan:      We will plan to get a 24 hour urine collection in about 4 weeks.  Theresa Hobbs will then be called with the results and recommendations for changes that can be made to limit further stone formation.    Ditropan sent for bladder spasms.

## 2017-02-26 NOTE — Progress Notes (Signed)
After patient had stent removed she was crying and leaning forward saying that she was in a lot of pain. She was explained that she would have some discomfort and that she needed to use heat, tylenol and oxybutynin when she got home. She continued to say over and over that she was in pain rocking on bed and was crying. She was having spasms and urine/water was leaking. I gave her several towels to clean up with then she got up and got angry and said she felt like she was going to pass out and vomit. I advised for her to have a seat until she felt okay. She voiced with anger that "I am going to the ER, this is ridiculous, there is something wrong Im going to the ER" and walked out of office. She never gave Dr.Nicholson a chance to come back in the room to evaulate the situations. I discussed this with Dr. Harle StanfordNicholson he is aware that patient was going to the ER.

## 2017-04-18 DIAGNOSIS — R112 Nausea with vomiting, unspecified: Secondary | ICD-10-CM

## 2017-04-18 NOTE — ED Notes (Signed)
Bed: 004A  Expected date: 04/18/17  Expected time: 8:39 PM  Means of arrival: Ambulance  Comments:     Glena Norfolkathan Charlayne Vultaggio, RN  04/18/17 2059

## 2017-04-18 NOTE — ED Notes (Signed)
CT notified patient received pain meds and states she will go to CT scan at this time.     Nona DellNicole M Kaipo Ardis, RN  04/18/17 [phone]

## 2017-04-18 NOTE — ED Notes (Signed)
Pt to er. Pt c/o bilateral flank pain that radiates around to abd. Pt states has hx of kidney CA. States stopped chemo treatment last year. Pt states felt like that was killing her more than the CA. Pt states does not produce much urine and when she goes, it is very painful. Pt c/o nausea and emesis. Pt states has been going on for 3 months but for past 2 weeks she hasnt been able to eat anything and the past 2 days she has been able to keep liquid down. Pt states has had a fever for 1 week. Pt states took tylenol around 2000 tonight. Assessment completed. Resp easy and regular. Provider at bedside. Call light in reach.      David StallSarah Iretha Kirley, RN  04/18/17 2104

## 2017-04-18 NOTE — ED Notes (Signed)
RN enters room to find patient attempting to forcefully vomit, no emesis noted. Patient states what kind of doctor gives oral meds to a nauseous patient. Patient stated no concerns while taken medication. Patient states she wants a different doctor. Theresa RifeGuerrero MD notified. Guerrero MD at bedside discussing with patient the shortage of IV zofran. Patient states she wants IV phenergan. MD states he can try to obtain IV zofran from off unit and he was trying to give the disintegrating tab in order to get it to her faster. Patient states she cant take oral Ibuprofen ordered, that patient had previous asked for. Patient visually upset about oral meds.      Theresa Dellicole M Rozina Pointer, RN  04/18/17 2209

## 2017-04-18 NOTE — ED Notes (Signed)
Patient refusing to go to CT scan until she receives pain meds. Genella RifeGuerrero MD aware.     Nona Dellicole M Ruberta Holck, RN  04/18/17 2220

## 2017-04-18 NOTE — ED Provider Notes (Signed)
Physician Note:    I have personally performed and participated in the history, exam and medical decision making and agree with all pertinent clinical information.  I have also reviewed and agree with the past medical, family and social history unless otherwise noted.      Evaluation:      9:25 PM: The patient was evaluated.     Theresa Hobbs is a 51 y.o. female who presents to the Emergency Department for the evaluation of patient has abdominal pain she has subjective fever of 102.  She has nausea and vomiting.  She does have chronic kidney pain secondary to kidney stones.  Patient states she is unable to keep fluids down.  Patient abdominal exam is relatively benign.    Labs Reviewed   URINE CULTURE, REFLEXED - Abnormal; Notable for the following:        Result Value    Organism Escherichia coli (*)     All other components within normal limits    Narrative:     Source: urine       Site: unknown collect          Current Antibiotics: none   BASIC METABOLIC PANEL - Abnormal; Notable for the following:     Potassium 3.2 (*)     Glucose 134 (*)     All other components within normal limits   HEPATIC FUNCTION PANEL - Abnormal; Notable for the following:     Total Bilirubin 0.2 (*)     All other components within normal limits   SALICYLATE LEVEL - Abnormal; Notable for the following:     Salicylate, Serum < 0.3 (*)     All other components within normal limits   CBC WITH AUTO DIFFERENTIAL - Abnormal; Notable for the following:     WBC 12.7 (*)     Segs Absolute 8.2 (*)     Immature Grans (Abs) 0.09 (*)     All other components within normal limits   URINE WITH REFLEXED MICRO - Abnormal; Notable for the following:     Blood, Urine SMALL (*)     Leukocyte Esterase, Urine SMALL (*)     Character, Urine CLOUDY (*)     All other components within normal limits   GLOMERULAR FILTRATION RATE, ESTIMATED - Abnormal; Notable for the following:     Est, Glom Filt Rate 75 (*)     All other components within normal limits   PROTIME-INR    LIPASE   TROPONIN   HCG, SERUM, QUALITATIVE   URINE DRUG SCREEN   ETHANOL   ACETAMINOPHEN LEVEL   ANION GAP   OSMOLALITY     CT ABDOMEN PELVIS WO CONTRAST Additional Contrast? None   Final Result   1. Interval removal of left-sided ureteral stent.   2. Mild bilateral fullness of the ureters which may reflect minimal peristalsis.         3. Thick-walled appearance of the bladder correlate with cystitis.       **This report has been created using voice recognition software. It may contain minor errors which are inherent in voice recognition technology.**      Final report electronically signed by Dr. Helyn NumbersPeter Piampiano on 04/18/2017 11:24 PM            FINAL IMPRESSION      1. Non-intractable vomiting with nausea, unspecified vomiting type    2. Acute cystitis with hematuria        I saw this patient with Dr. Genella RifeGuerrero;  I agree with his assessment and plan.      The information in this document, created by the medical scribe for me,   accurately reflects the services I personally performed and the decisions   made by me. I have reviewed and approved this document for accuracy   prior to leaving the patient care area.    Lala Lund, MD 5:17 PM          Patrina Levering, DO  04/22/17 [phone]

## 2017-04-18 NOTE — ED Provider Notes (Signed)
[facility]  eMERGENCY dEPARTMENT eNCOUnter          CHIEF COMPLAINT       Chief Complaint   Patient presents with   . Nausea   . Emesis   . Abdominal Pain       Nurses Notes reviewed and I agree except as noted in the HPI.    HISTORY OF PRESENT ILLNESS    Theresa Hobbs is a 51 y.o. female who presents to the Emergency Department for the evaluation of abdominal pain with fever, nausea and vomiting ongoing for the last 3 months.  Worse over the last 3 months.  States fever averages 102 over the last 3 months. Emesis is nonbloody.  Chronic flank pain per patient secondary to kidney stones.  Last bowel movement yesterday, nonbloody.  No constipation or diarrhea.  As stated, patient had months.  We will tolerate the pain until the last several days.  Nausea and vomiting worse.  Unable to tolerate solids.  Able to keep down fluids.  States adequate hydration water.  Patient has history significant for nephrolithiasis and cholelithiasis.  Patient states she only takes Prilosec, Mirapex, Effexor and trazodone.  She admits to smoking tobacco and marijuana.  Denies other drugs.     The HPI was provided by the patient.     REVIEW OF SYSTEMS     Review of Systems   Constitutional: Negative for chills and fatigue.   HENT: Negative for congestion, rhinorrhea, sinus pressure, sore throat and tinnitus.    Eyes: Negative for photophobia and visual disturbance.   Respiratory: Negative for cough, chest tightness, shortness of breath and wheezing.    Cardiovascular: Negative for chest pain and palpitations.   Gastrointestinal: Positive for abdominal pain, nausea and vomiting. Negative for abdominal distention, blood in stool, constipation and diarrhea.   Genitourinary: Negative for dysuria, flank pain, frequency and urgency.   Musculoskeletal: Negative for back pain, neck pain and neck stiffness.   Skin: Negative for pallor, rash and wound.   Neurological: Negative for dizziness, syncope, weakness, light-headedness,  numbness and headaches.   Psychiatric/Behavioral: Negative for agitation and confusion.       PAST MEDICAL HISTORY    has a past medical history of Anxiety; Asthma; Cancer (HCC); Chronic kidney disease; Depression; Diabetes mellitus (HCC); Fibromyalgia; HTN (hypertension); Lupus; MRSA (methicillin resistant Staphylococcus aureus); Post traumatic stress disorder (PTSD); Psychiatric problem; and Seizures (HCC).    SURGICAL HISTORY      has a past surgical history that includes Cholecystectomy (2000); Nose surgery (2007); Hysterectomy (1998); Colonoscopy (2004); Upper gastrointestinal endoscopy (2004); Abdominal hernia repair (02-07-13); other surgical history (Right, 11/26/2015); and pr office/outpt visit,procedure only (N/A, 01/30/2017).    CURRENT MEDICATIONS       Previous Medications    ALBUTEROL SULFATE HFA 108 (90 BASE) MCG/ACT INHALER    Inhale 2 puffs into the lungs every 6 hours as needed for Wheezing    OMEPRAZOLE (PRILOSEC) 40 MG DELAYED RELEASE CAPSULE    Take 40 mg by mouth daily    PRAMIPEXOLE (MIRAPEX) 0.25 MG TABLET    Take 1 tablet by mouth nightly    TRAZODONE (DESYREL) 100 MG TABLET    Take 200 mg by mouth nightly    VENLAFAXINE (EFFEXOR-XR) 150 MG XR CAPSULE    Take 150 mg by mouth daily.       ALLERGIES     is allergic to imitrex [sumatriptan]; iv dye [iodides]; amoxicillin-pot clavulanate; azithromycin; bactrim; cefuroxime axetil; ciprofloxacin; erythromycin; flagyl [  metronidazole]; ketorolac tromethamine; klonopin [clonazepam]; levofloxacin; nubain [nalbuphine hcl]; other; pcn [penicillins]; reglan [metoclopramide]; sulfa antibiotics; and tramadol.    FAMILY HISTORY     is adopted.    family history includes Cancer in her father and mother; Osteoporosis in her mother. She was adopted.    SOCIAL HISTORY      reports that she has been smoking Cigarettes.  She has a 0.60 pack-year smoking history. She has never used smokeless tobacco. She reports that she uses drugs, including Marijuana. She reports  that she does not drink alcohol.    PHYSICAL EXAM     INITIAL VITALS:  height is 5' (1.524 m) and weight is 185 lb (83.9 kg). Her oral temperature is 99 F (37.2 C). Her blood pressure is 144/72 (abnormal) and her pulse is 86. Her respiration is 16 and oxygen saturation is 93%.    Physical Exam   Constitutional: She is oriented to person, place, and time. She appears well-developed and well-nourished. No distress.   HENT:   Head: Normocephalic and atraumatic.   Right Ear: External ear normal.   Left Ear: External ear normal.   Mouth/Throat: Oropharynx is clear and moist. No oropharyngeal exudate.   Eyes: Pupils are equal, round, and reactive to light. Conjunctivae and EOM are normal. Right eye exhibits no discharge. Left eye exhibits no discharge.   Neck: Normal range of motion. Neck supple. No tracheal deviation present. No thyromegaly present.   Cardiovascular: Normal rate, regular rhythm and normal heart sounds.    No murmur heard.  Pulmonary/Chest: Effort normal and breath sounds normal. No respiratory distress. She has no wheezes. She has no rales.   Abdominal: Soft. Bowel sounds are normal. She exhibits no distension and no mass. There is tenderness (diffuse tenderness). There is no rebound and no guarding.   Musculoskeletal: Normal range of motion. She exhibits no edema or tenderness.   Neurological: She is alert and oriented to person, place, and time.   Skin: Skin is warm and dry. No rash noted. No erythema. No pallor.   Psychiatric: She has a normal mood and affect. Judgment normal.       DIFFERENTIAL DIAGNOSIS:   SBO, pancreatitis, appendicitis, gastritis, nephrolithiasis, acute cystitis    DIAGNOSTIC RESULTS     EKG: All EKG's are interpreted by the Emergency Department Physician who either signs or Co-signs this chart in the absence of a cardiologist.      RADIOLOGY: non-plain film images(s) such as CT, Ultrasound and MRI are read by the radiologist.    CT ABDOMEN PELVIS WO CONTRAST Additional  Contrast? None   Final Result   1. Interval removal of left-sided ureteral stent.   2. Mild bilateral fullness of the ureters which may reflect minimal peristalsis.         3. Thick-walled appearance of the bladder correlate with cystitis.       **This report has been created using voice recognition software. It may contain minor errors which are inherent in voice recognition technology.**      Final report electronically signed by Dr. Helyn Numbers on 04/18/2017 11:24 PM            LABS:   Labs Reviewed   BASIC METABOLIC PANEL - Abnormal; Notable for the following:        Result Value    Potassium 3.2 (*)     Glucose 134 (*)     All other components within normal limits   HEPATIC FUNCTION PANEL - Abnormal; Notable for  the following:     Total Bilirubin 0.2 (*)     All other components within normal limits   SALICYLATE LEVEL - Abnormal; Notable for the following:     Salicylate, Serum < 0.3 (*)     All other components within normal limits   CBC WITH AUTO DIFFERENTIAL - Abnormal; Notable for the following:     WBC 12.7 (*)     Segs Absolute 8.2 (*)     Immature Grans (Abs) 0.09 (*)     All other components within normal limits   URINE WITH REFLEXED MICRO - Abnormal; Notable for the following:     Blood, Urine SMALL (*)     Leukocyte Esterase, Urine SMALL (*)     Character, Urine CLOUDY (*)     All other components within normal limits   GLOMERULAR FILTRATION RATE, ESTIMATED - Abnormal; Notable for the following:     Est, Glom Filt Rate 75 (*)     All other components within normal limits   URINE CULTURE, REFLEXED    Narrative:     Source: urine       Site: unknown collect          Current Antibiotics: none   PROTIME-INR   LIPASE   TROPONIN   HCG, SERUM, QUALITATIVE   URINE DRUG SCREEN   ETHANOL   ACETAMINOPHEN LEVEL   ANION GAP   OSMOLALITY       EMERGENCY DEPARTMENT COURSE:   Vitals:    Vitals:    04/18/17 2354 04/18/17 2355 04/19/17 0044 04/19/17 0045   BP:  118/66  (!) 144/72   Pulse: 96  86    Resp: 16  16     Temp:       TempSrc:       SpO2:   93%    Weight:       Height:         9:24 PM: The patient was seen and evaluated in a timely fashion.    9:32 PM: patient requesting Motrin.      9:47 PM: Motrin ordered. Patient refused. Pt seen attempting to vomit at bedside. Dry heaving and spitting. No emesis noted. Discussed attempt to improve emesis. IVF also started.     12:30 PM: pain improved (2/10). Doxycycline started. Nausea improved with addition of compazine.     10:30 PM: patient appears uncomfortable. Requesting pain meds prior to CT. Discussed morphine to aide with pain in order to get the CT scan. IV morphine 2mg  ordered.     11:34 PM: Morphine 2 mg provided minimal relief. Will give another 4mg  and give rocephin. Discussed likely UTI.     01:30 PM: improved symptoms. Much more comfortable. Nausea resolved. Patient ready to go home.       MEDICAL DECISION MAKING:  Reviewed blood work and labs. Very mild hypokalemia, discussed Gatorade and fruit to help increase potassium. Likely secondary to nausea and vomiting as well. In regards to abdominal pain, nausea and vomiting, moderately improved during hospital course. Imaging shows stable left uretal stent and thickened bladder wall suggesting cystitis Considering +LE and CT report will treat for UTI. Previously tolerated doxycycline per notes.  Discussed discharge with patient and follow up with PCP, Baller.       CRITICAL CARE:   n/a     CONSULTS:  n/a    PROCEDURES:  None    FINAL IMPRESSION      1. Non-intractable vomiting with nausea, unspecified vomiting type  2. Acute cystitis with hematuria          DISPOSITION/PLAN   Home with antibiotics and follow up with PCP.     PATIENT REFERRED TO:  Nils Flackourtney E Baller, APRN - CNP  [address]  [place] [id number]  [phone]    Schedule an appointment as soon as possible for a visit       [facility]  [address]  [place] [id number]  [phone]    As needed, If symptoms worsen      DISCHARGE  MEDICATIONS:  New Prescriptions    No medications on file       (Please note that portions of this note were completed with a voice recognition program.  Efforts were made to edit the dictations but occasionally words are mis-transcribed.)       Lala LundManuel Tyneshia Stivers, MD  04/19/17 [phone]

## 2017-04-18 NOTE — ED Notes (Signed)
Patient refuses the compazine for nausea at this time and states she will just try the zofran. She states she really only wants something for pain. Explained to patient that if we can make her nausea better that will help with pain also.      Nona DellNicole M Amadu Schlageter, RN  04/18/17 2325

## 2017-04-18 NOTE — ED Notes (Signed)
Patient visually upset and crying. Patient refuses blood pressure and pulse ox at this time. Patient states she needs nothing else at this time.     Nona Dellicole M Ranelle Auker, RN  04/18/17 2211

## 2017-04-19 ENCOUNTER — Inpatient Hospital Stay
Admit: 2017-04-19 | Discharge: 2017-04-19 | Disposition: A | Discharge status: Home / Self Care | Payer: MEDICARE | Attending: Family Medicine

## 2017-04-19 LAB — CBC WITH AUTO DIFFERENTIAL
Basophils Absolute: 0.1 10*3/uL (ref 0.0–0.1)
Basophils: 0.5 %
Eosinophils Absolute: 0.2 10*3/uL (ref 0.0–0.4)
Eosinophils: 1.7 %
Hematocrit: 38.7 % (ref 37.0–47.0)
Hemoglobin: 13.5 gm/dl (ref 12.0–16.0)
Immature Grans (Abs): 0.09 10*3/uL — ABNORMAL HIGH (ref 0.00–0.07)
Immature Granulocytes: 0.7 %
Lymphocytes Absolute: 2.9 10*3/uL (ref 1.0–4.8)
Lymphocytes: 23.2 %
MCH: 29.5 pg (ref 26.0–33.0)
MCHC: 34.9 gm/dl (ref 32.2–35.5)
MCV: 84.5 fL (ref 81.0–99.0)
MPV: 10.6 fL (ref 9.4–12.4)
Monocytes Absolute: 1.1 10*3/uL (ref 0.4–1.3)
Monocytes: 9 %
Platelets: 319 10*3/uL (ref 130–400)
RBC: 4.58 10*6/uL (ref 4.20–5.40)
RDW-CV: 13.1 % (ref 11.5–14.5)
RDW-SD: 40.5 fL (ref 35.0–45.0)
Seg Neutrophils: 64.9 %
Segs Absolute: 8.2 10*3/uL — ABNORMAL HIGH (ref 1.8–7.7)
WBC: 12.7 10*3/uL — ABNORMAL HIGH (ref 4.8–10.8)
nRBC: 0 /100 wbc

## 2017-04-19 LAB — HCG, SERUM, QUALITATIVE: Preg, Serum: NEGATIVE

## 2017-04-19 LAB — BASIC METABOLIC PANEL
BUN: 14 mg/dL (ref 7–22)
CO2: 23 meq/L (ref 23–33)
Calcium: 9 mg/dL (ref 8.5–10.5)
Chloride: 103 meq/L (ref 98–111)
Creatinine: 0.8 mg/dL (ref 0.4–1.2)
Glucose: 134 mg/dL — ABNORMAL HIGH (ref 70–108)
Potassium: 3.2 meq/L — ABNORMAL LOW (ref 3.5–5.2)
Sodium: 140 meq/L (ref 135–145)

## 2017-04-19 LAB — URINE DRUG SCREEN
Amphetamine+Methamphetamine Urine Screen: NEGATIVE
Barbiturate Quant, Ur: NEGATIVE
Benzodiazepine Quant, Ur: NEGATIVE
Cannabinoid Quant, Ur: POSITIVE
Cocaine Metab Quant, Ur: NEGATIVE
Opiates, Urine: NEGATIVE
Oxycodone: NEGATIVE
PCP Quant, Ur: NEGATIVE

## 2017-04-19 LAB — URINE WITH REFLEXED MICRO
Bilirubin Urine: NEGATIVE
CASTS 2: NONE SEEN /lpf
Crystals, UA: NONE SEEN
Glucose, Ur: NEGATIVE mg/dl
Ketones, Urine: NEGATIVE
MISCELLANEOUS 2: NONE SEEN
Nitrite, Urine: NEGATIVE
Protein, UA: NEGATIVE
Renal Epithelial, UA: NONE SEEN
Specific Gravity, Urine: 1.019 (ref 1.002–1.03)
Urobilinogen, Urine: 0.2 eu/dl (ref 0.0–1.0)
Yeast, UA: NONE SEEN
pH, UA: 5.5 (ref 5.0–9.0)

## 2017-04-19 LAB — GLOMERULAR FILTRATION RATE, ESTIMATED: Est, Glom Filt Rate: 75 mL/min/{1.73_m2} — AB

## 2017-04-19 LAB — EKG 12-LEAD
Atrial Rate: 85 {beats}/min
P Axis: 31 degrees
P-R Interval: 108 ms
Q-T Interval: 368 ms
QRS Duration: 82 ms
QTc Calculation (Bazett): 437 ms
R Axis: 33 degrees
T Axis: 51 degrees
Ventricular Rate: 85 {beats}/min

## 2017-04-19 LAB — HEPATIC FUNCTION PANEL
ALT: 17 U/L (ref 11–66)
AST: 14 U/L (ref 5–40)
Albumin: 3.7 g/dL (ref 3.5–5.1)
Alkaline Phosphatase: 85 U/L (ref 38–126)
Bilirubin, Direct: <0.2 mg/dL (ref 0.0–0.3)
Total Bilirubin: 0.2 mg/dL — ABNORMAL LOW (ref 0.3–1.2)
Total Protein: 7.1 g/dL (ref 6.1–8.0)

## 2017-04-19 LAB — ANION GAP: Anion Gap: 14 meq/L (ref 8.0–16.0)

## 2017-04-19 LAB — ETHANOL: ETHYL ALCOHOL, SERUM: <0.01 %

## 2017-04-19 LAB — PROTIME-INR: INR: 1.12 (ref 0.85–1.13)

## 2017-04-19 LAB — TROPONIN: Troponin T: <0.01 ng/ml

## 2017-04-19 LAB — OSMOLALITY: Osmolality Calc: 281.8 mOsmol/kg (ref >=275.0)

## 2017-04-19 LAB — ACETAMINOPHEN LEVEL: Acetaminophen Level: 8.1 ug/mL (ref 0.0–20.0)

## 2017-04-19 LAB — SALICYLATE LEVEL: Salicylate, Serum: <0.3 mg/dL — ABNORMAL LOW (ref 2.0–10.0)

## 2017-04-19 LAB — LIPASE: Lipase: 17.9 U/L (ref 5.6–51.3)

## 2017-04-19 MED ORDER — DOXYCYCLINE HYCLATE 100 MG PO TABS
100 MG | ORAL_TABLET | Freq: Two times a day (BID) | ORAL | 0 refills | Status: AC
Start: 2017-04-19 — End: 2017-04-29

## 2017-04-19 MED ORDER — KETOROLAC TROMETHAMINE 30 MG/ML IJ SOLN
30 MG/ML | INTRAMUSCULAR | Status: DC
Start: 2017-04-19 — End: 2017-04-18

## 2017-04-19 MED ORDER — ONDANSETRON HCL 4 MG PO TABS
4 MG | ORAL_TABLET | Freq: Three times a day (TID) | ORAL | 0 refills | Status: AC | PRN
Start: 2017-04-19 — End: 2017-05-09

## 2017-04-19 MED ORDER — SODIUM CHLORIDE 0.9 % IV BOLUS
0.9 % | Freq: Once | INTRAVENOUS | Status: AC
Start: 2017-04-19 — End: 2017-04-19
  Administered 2017-04-19: 01:00:00 500 mL via INTRAVENOUS

## 2017-04-19 MED ORDER — ONDANSETRON HCL 4 MG/2ML IJ SOLN
4 MG/2ML | Freq: Once | INTRAMUSCULAR | Status: AC
Start: 2017-04-19 — End: 2017-04-18
  Administered 2017-04-19: 03:00:00 4 mg via INTRAVENOUS

## 2017-04-19 MED ORDER — MORPHINE SULFATE 2 MG/ML IJ SOLN
2 MG/ML | Freq: Once | INTRAMUSCULAR | Status: AC
Start: 2017-04-19 — End: 2017-04-18
  Administered 2017-04-19: 03:00:00 2 mg via INTRAVENOUS

## 2017-04-19 MED ORDER — PANTOPRAZOLE SODIUM 40 MG PO TBEC
40 MG | Freq: Once | ORAL | Status: AC
Start: 2017-04-19 — End: 2017-04-18
  Administered 2017-04-19: 01:00:00 40 mg via ORAL

## 2017-04-19 MED ORDER — MORPHINE SULFATE 4 MG/ML IJ SOLN
4 MG/ML | Freq: Once | INTRAMUSCULAR | Status: AC
Start: 2017-04-19 — End: 2017-04-19
  Administered 2017-04-19: 05:00:00 4 mg via INTRAVENOUS

## 2017-04-19 MED ORDER — DOXYCYCLINE HYCLATE 100 MG IV SOLR
100 MG | Freq: Once | INTRAVENOUS | Status: AC
Start: 2017-04-19 — End: 2017-04-19
  Administered 2017-04-19: 05:00:00 100 mg via INTRAVENOUS

## 2017-04-19 MED ORDER — IBUPROFEN 200 MG PO TABS
200 MG | Freq: Once | ORAL | Status: DC
Start: 2017-04-19 — End: 2017-04-19

## 2017-04-19 MED ORDER — PROCHLORPERAZINE EDISYLATE 5 MG/ML IJ SOLN
5 MG/ML | Freq: Once | INTRAMUSCULAR | Status: AC
Start: 2017-04-19 — End: 2017-04-19
  Administered 2017-04-19: 04:00:00 10 mg via INTRAVENOUS

## 2017-04-19 MED ORDER — SUCRALFATE 1 G PO TABS
1 GM | Freq: Once | ORAL | Status: DC
Start: 2017-04-19 — End: 2017-04-19

## 2017-04-19 MED ORDER — ONDANSETRON 4 MG PO TBDP
4 MG | Freq: Once | ORAL | Status: AC
Start: 2017-04-19 — End: 2017-04-18
  Administered 2017-04-19: 01:00:00 4 mg via ORAL

## 2017-04-19 MED FILL — ONDANSETRON 4 MG PO TBDP: 4 MG | ORAL | Qty: 1

## 2017-04-19 MED FILL — SUCRALFATE 1 G PO TABS: 1 GM | ORAL | Qty: 1

## 2017-04-19 MED FILL — MORPHINE SULFATE 4 MG/ML IJ SOLN: 4 mg/mL | INTRAMUSCULAR | Qty: 1

## 2017-04-19 MED FILL — PROCHLORPERAZINE EDISYLATE 5 MG/ML IJ SOLN: 5 MG/ML | INTRAMUSCULAR | Qty: 2

## 2017-04-19 MED FILL — IBU-200 200 MG PO TABS: 200 MG | ORAL | Qty: 2

## 2017-04-19 MED FILL — KETOROLAC TROMETHAMINE 30 MG/ML IJ SOLN: 30 MG/ML | INTRAMUSCULAR | Qty: 1

## 2017-04-19 MED FILL — DOXY 100 100 MG IV SOLR: 100 MG | INTRAVENOUS | Qty: 100

## 2017-04-19 MED FILL — MORPHINE SULFATE 2 MG/ML IJ SOLN: 2 mg/mL | INTRAMUSCULAR | Qty: 1

## 2017-04-19 MED FILL — PANTOPRAZOLE SODIUM 40 MG PO TBEC: 40 MG | ORAL | Qty: 1

## 2017-04-19 NOTE — Progress Notes (Signed)
Pharmacy Note  ED Culture Follow-up    Theresa Hobbs is a 51 y.o. female.     Allergies: Imitrex [sumatriptan]; Iv dye [iodides]; Amoxicillin-pot clavulanate; Azithromycin; Bactrim; Cefuroxime axetil; Ciprofloxacin; Erythromycin; Flagyl [metronidazole]; Ketorolac tromethamine; Klonopin [clonazepam]; Levofloxacin; Nubain [nalbuphine hcl]; Other; Pcn [penicillins]; Reglan [metoclopramide]; Sulfa antibiotics; and Tramadol     Labs:  Lab Results   Component Value Date    BUN 14 04/18/2017    CREATININE 0.8 04/18/2017    WBC 12.7 (H) 04/18/2017     Estimated Creatinine Clearance: 80 mL/min (based on SCr of 0.8 mg/dL).    Current antimicrobials:    doxycycline    ASSESSMENT:  Micro results:   Urine culture: positive for e. coli      PLAN:  Need for intervention: No 2/2 s-tetracyclines   Discussed with: Theresa ShihYasir Abdulmunem, MD  Chosen treatment:     Patient already on appropriate treatment as above    Patient response:    No need to contact patient    Called/sent in prescription to: Not applicable    Please call with any questions. Ext. [phone]    Theresa Hobbs, PharmD 3:54 PM 04/21/2017

## 2017-04-19 NOTE — ED Notes (Signed)
Pt stable A&O x 3 given discharge and follow up info. Pt voiced no concerns and discharged from ER to self to home. Pt ambulated out of ER with no complications .       Glena NorfolkNathan Shubh Chiara, RN  04/19/17 [phone]

## 2017-04-19 NOTE — Discharge Instructions (Signed)
Please start taking the antibiotic in the morning, twice daily. Drink plenty of water. Use Zofran as needed for nausea and vomiting.     Follow up with your PCP.     Please return if symptoms worsen.

## 2017-04-20 LAB — URINE CULTURE, REFLEXED

## 2017-05-08 ENCOUNTER — Encounter: Payer: MEDICARE | Attending: Surgery | Primary: Family

## 2017-05-08 NOTE — Progress Notes (Signed)
Subjective:      Patient ID: Theresa RankinsJo A Berrie is a 51 y.o. female.  No show  HPI    Review of Systems    Objective:   Physical Exam    Assessment:            Plan:              Ihor GullyJennifer E March Steyer, RN

## 2019-08-07 IMAGING — MR MRI CSPINE WO CONTRAST
5 series · 39 of 48 positions shown · non-contrast
Comparison: None.

HISTORY: 53-year-old female. Acute cervical radiculopathy.
TECHNIQUE: Multiplanar, multisequential MRI of the cervical spine without intravenous contrast was performed.

[Series 1: bSSFP · axial · 6.0mm · 1.17mm/px · z∈[-51,+142]mm · 11 of 22 slices shown]
[im 1/22]
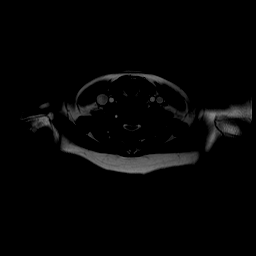
[im 3/22]
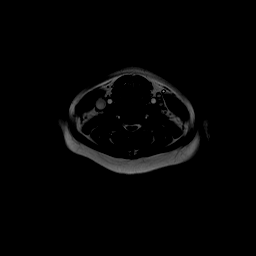
[im 5/22]
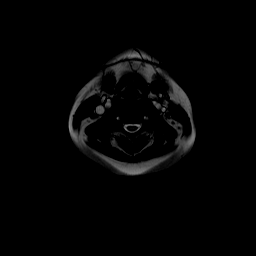
[im 7/22]
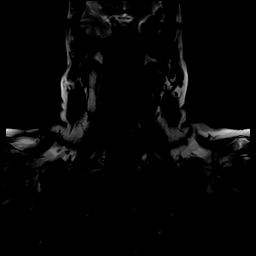
[im 9/22]
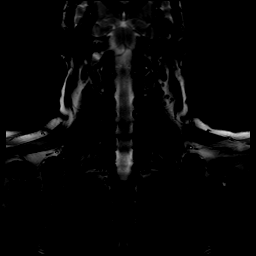
[im 11/22]
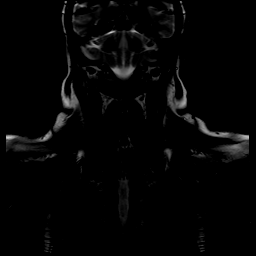
[im 13/22]
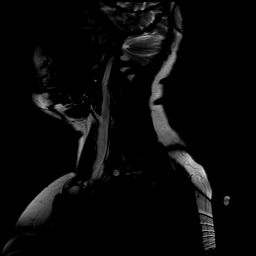
[im 15/22]
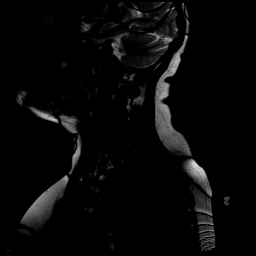
[im 17/22]
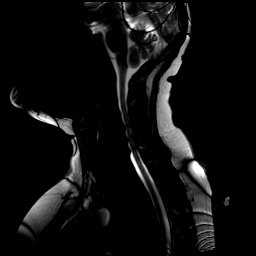
[im 19/22]
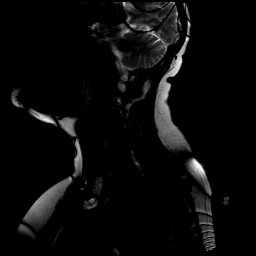
[im 22/22]
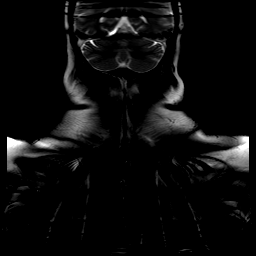

[Series 2: t2_sag · sagittal · 3.0mm · 0.57mm/px · 7 of 14 slices shown]
[im 1/14]
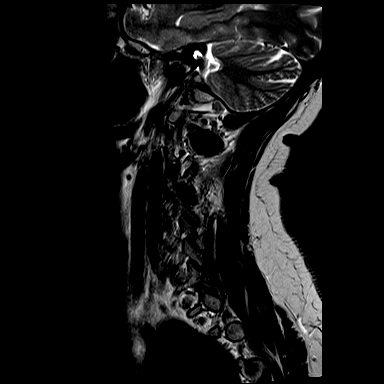
[im 3/14]
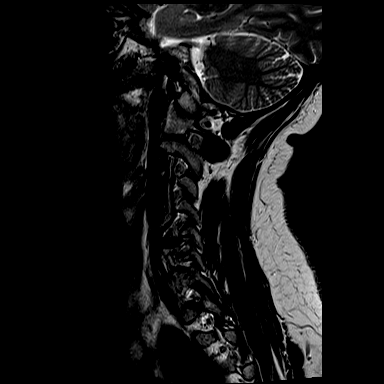
[im 5/14]
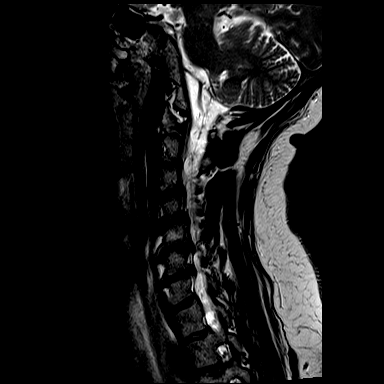
[im 7/14]
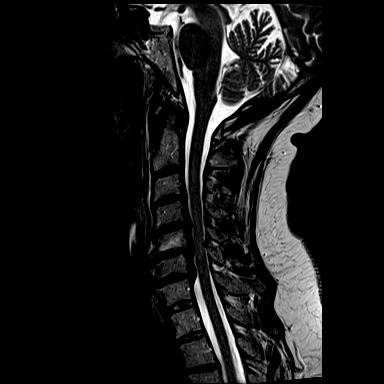
[im 9/14]
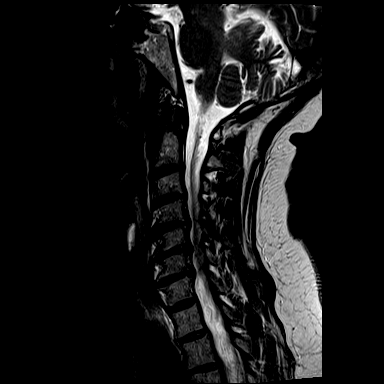
[im 11/14]
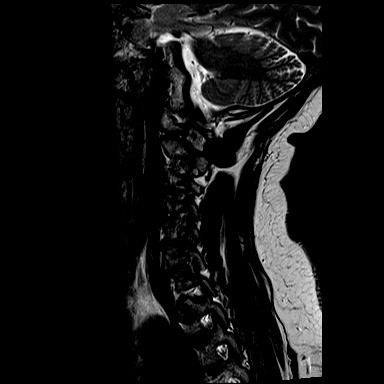
[im 14/14]
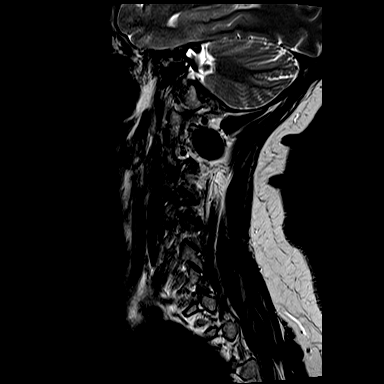

[Series 3: t1_sag · sagittal · 3.0mm · 0.57mm/px · 7 of 14 slices shown]
[im 1/14]
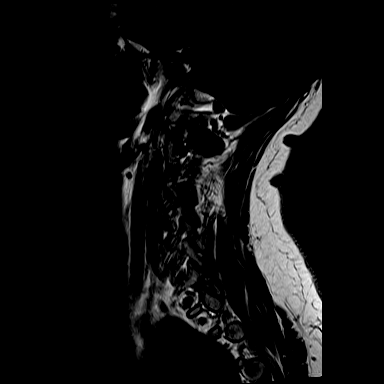
[im 3/14]
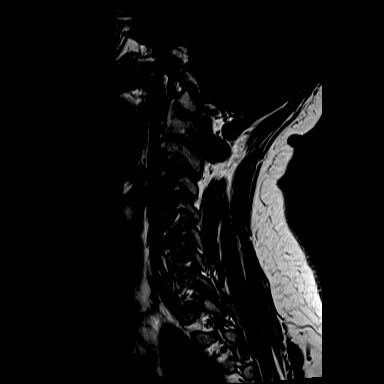
[im 5/14]
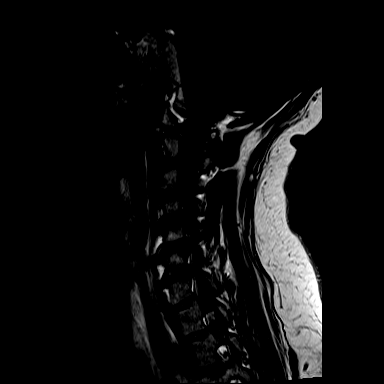
[im 7/14]
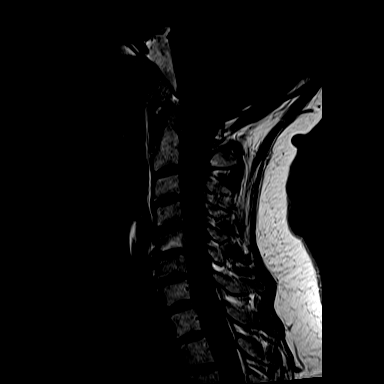
[im 9/14]
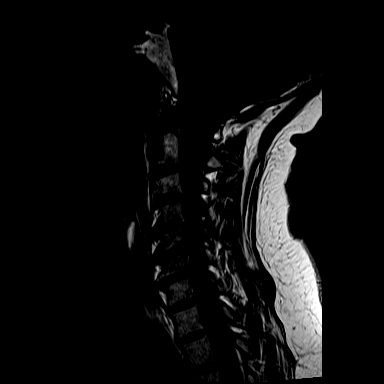
[im 11/14]
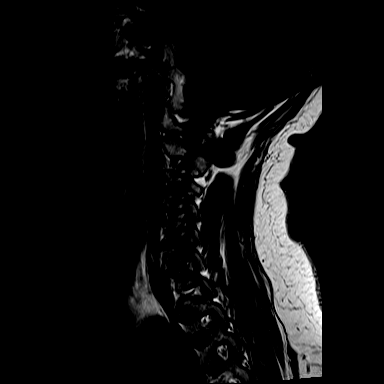
[im 14/14]
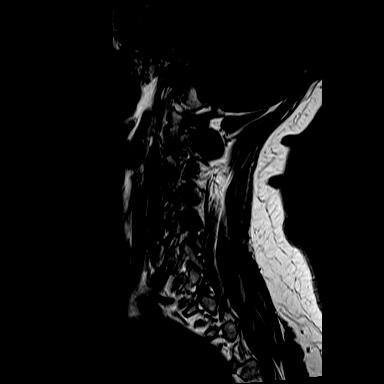

[Series 4: ir_sag · sagittal · 3.0mm · 0.69mm/px · 7 of 14 slices shown]
[im 1/14]
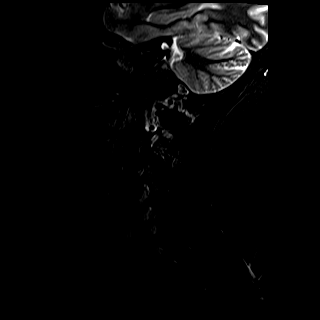
[im 3/14]
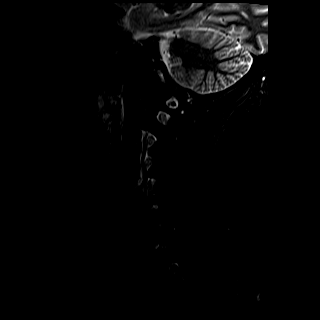
[im 5/14]
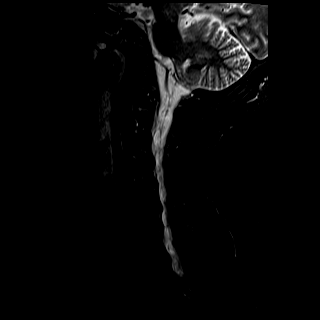
[im 7/14]
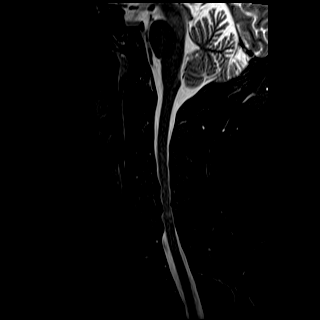
[im 9/14]
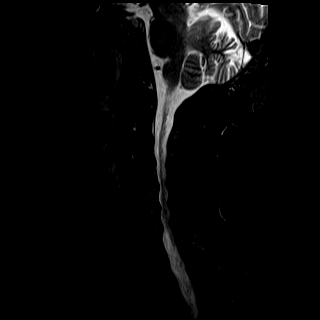
[im 11/14]
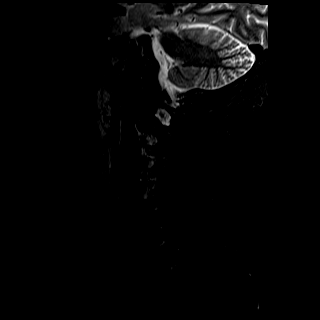
[im 14/14]
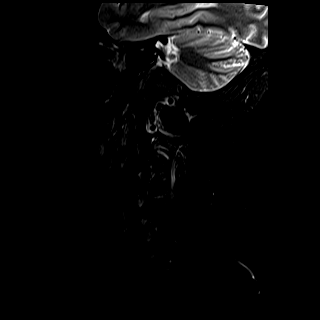

[Series 5: t2_medic_axial · axial · 3.0mm · 0.28mm/px · z∈[-69,+21]mm · 7 of 31 slices shown]
[im 3/31]
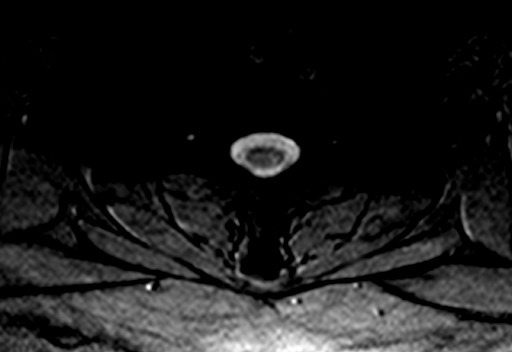
[im 7/31]
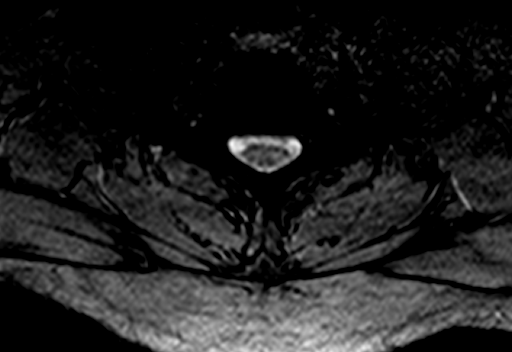
[im 11/31]
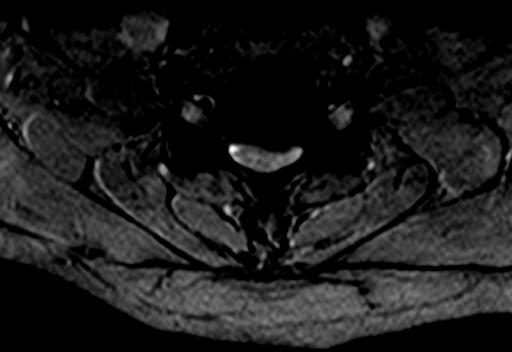
[im 15/31]
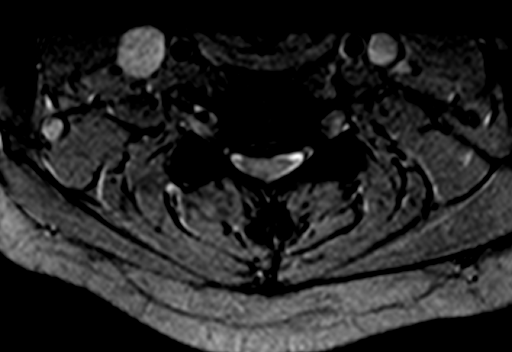
[im 19/31]
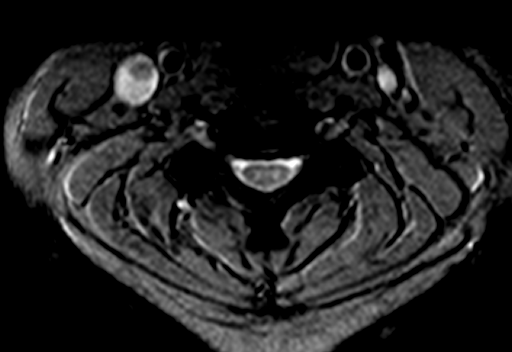
[im 23/31]
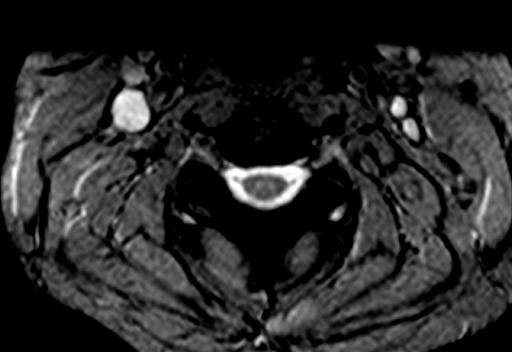
[im 27/31]
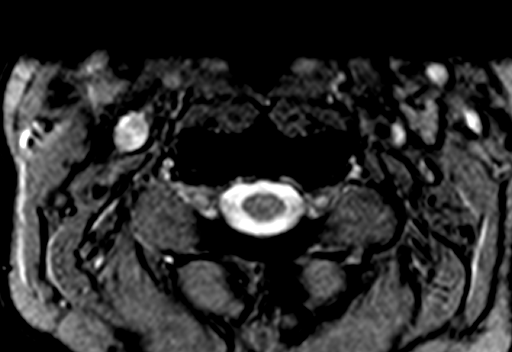

[39 of 48 positions shown; findings below may reference images not displayed]

FINDINGS: Straightening of the normal cervical lordosis.  Disc desiccation, disc space narrowing, and degenerative endplate change, severe at C5-C7 levels. No suspicious osseous lesion.

Question mild increased T2 signal in the cord at C5-C6 on the left, likely chronic myelomalacia. Ossification of the posterior longitudinal ligament from C3 to C7.

C2-3: No significant canal or neural foraminal stenosis.

C3-4: Uncovertebral and facet arthrosis. Mild canal and mild bilateral neural foraminal stenoses.

C4-5: Ossification of the posterior longitudinal ligament, disc osteophyte complex, and uncovertebral and facet arthrosis. Moderate canal and mild bilateral neural foraminal stenoses.

C5-6: Ossification of the posterior longitudinal ligament, left posterior disc osteophyte complex, and uncovertebral and facet arthrosis. Severe canal and severe left and moderate right neural foraminal stenoses.

C6-7: Ossification of the posterior longitudinal ligament and uncovertebral and facet arthrosis. Moderate canal and moderate to severe bilateral neural foraminal stenoses.

C7-T1: No significant canal or neural foraminal stenosis..

The paravertebral soft tissues are unremarkable.
IMPRESSION: Moderate to severe canal stenosis from C4 to C7 secondary to ossification of the posterior longitudinal ligament and disc osteophyte complexes. This is worst at C5-C6.

Multilevel neural foraminal narrowing, worst at C5-C6 on the left and C6-C7 bilaterally.

## 2019-11-15 IMAGING — CR C-SPINE FLEX 2 or 3 VIEWS
1 series · 6 of 6 positions shown · non-contrast
Comparison: CT cervical spine 11/15/2019, MRI cervical spine 08/07/2019

HISTORY: 54-year-old female with neck/cervical pain.
TECHNIQUE: 6 view radiograph of the cervical spine.

[Series 1: extension · 0.17mm/px · 6 of 6 slices shown]
[im 1/6]
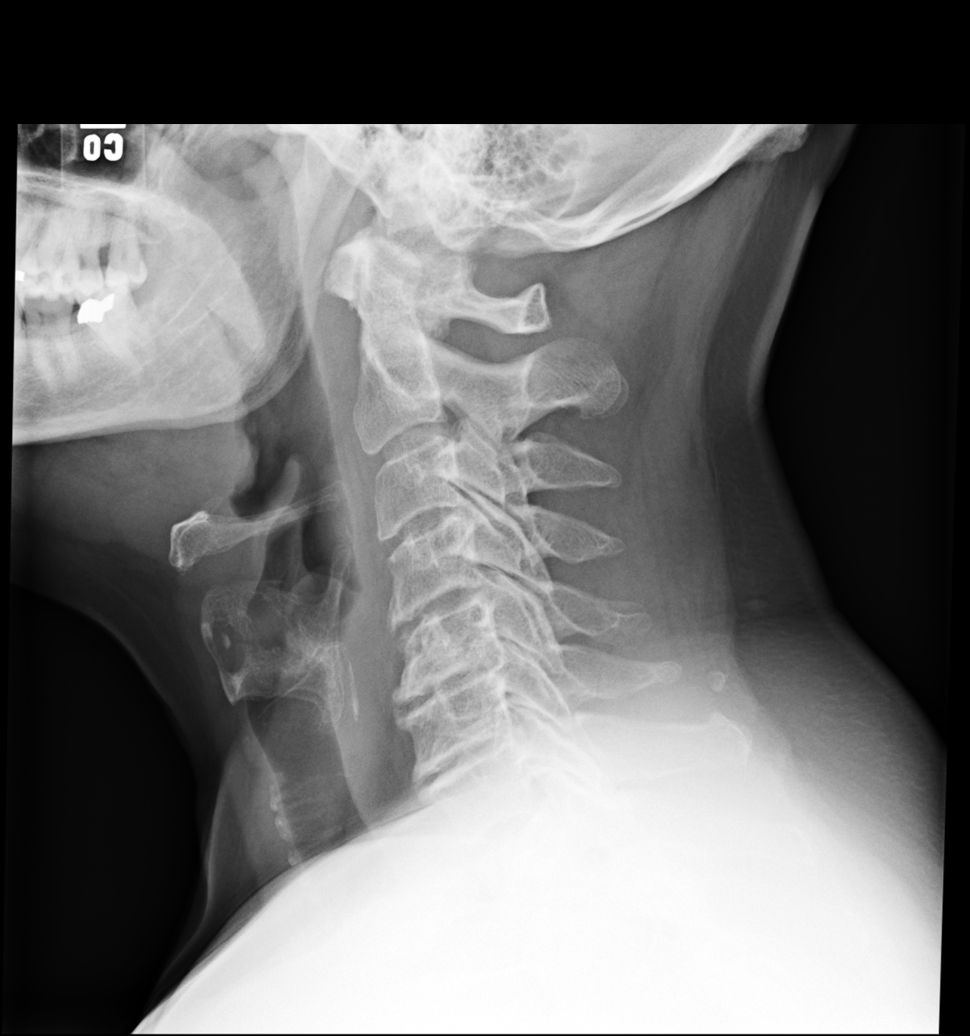
[im 2/6]
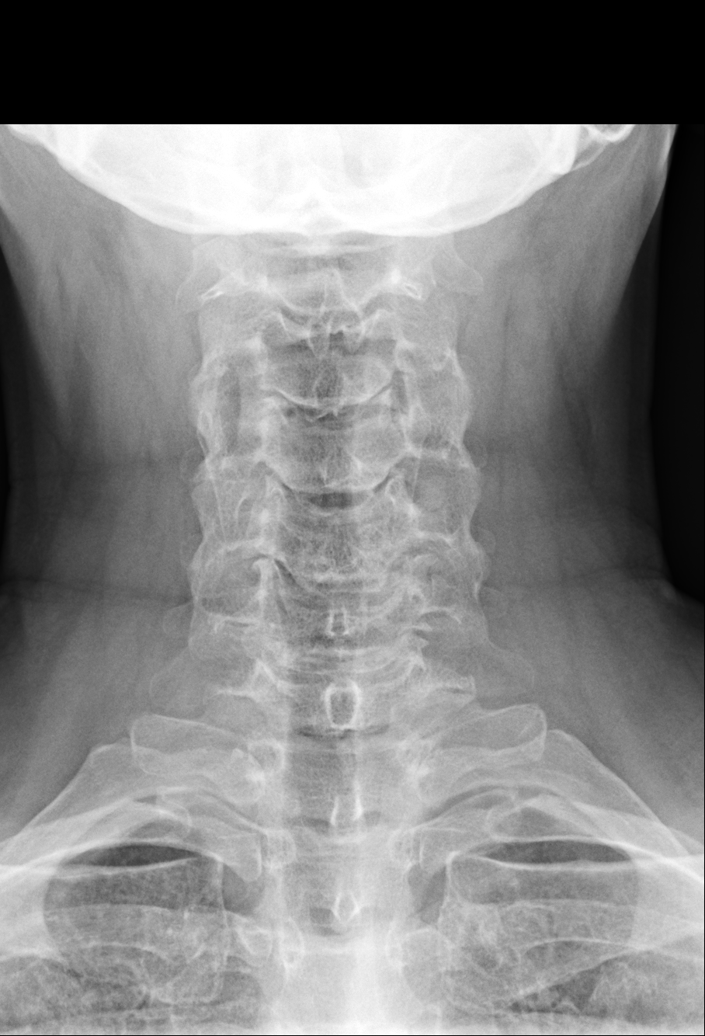
[im 3/6]
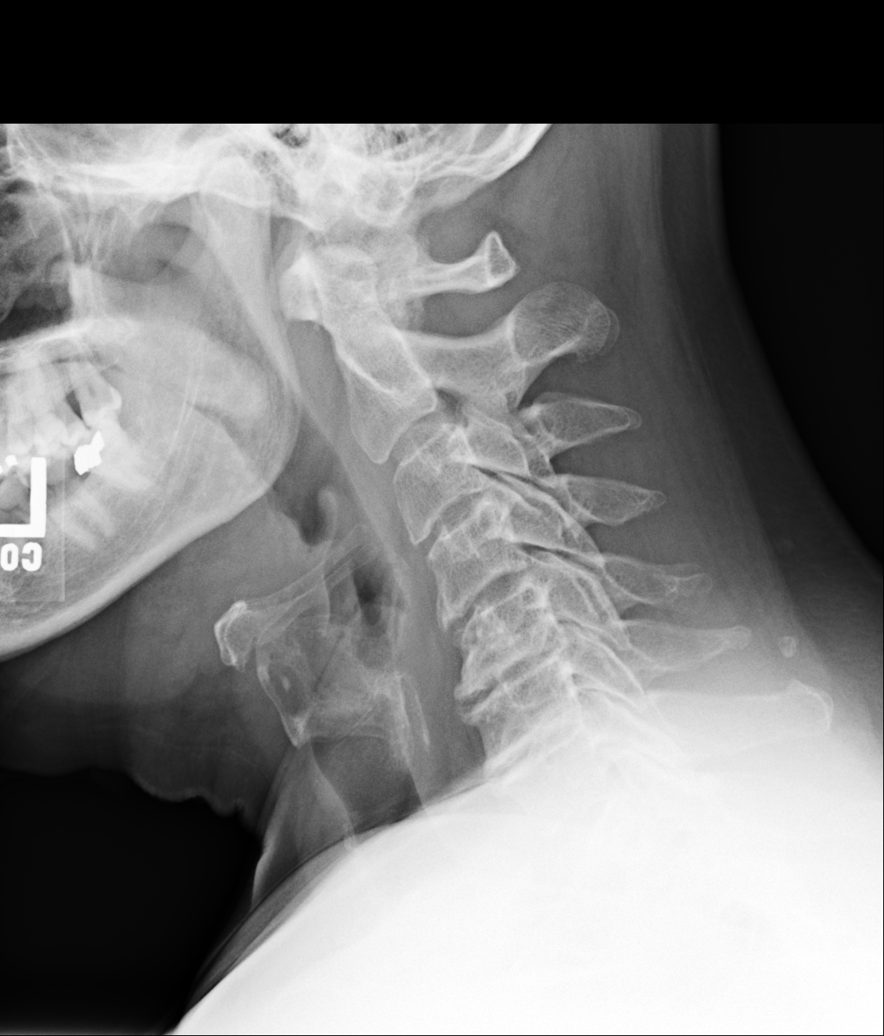
[im 4/6]
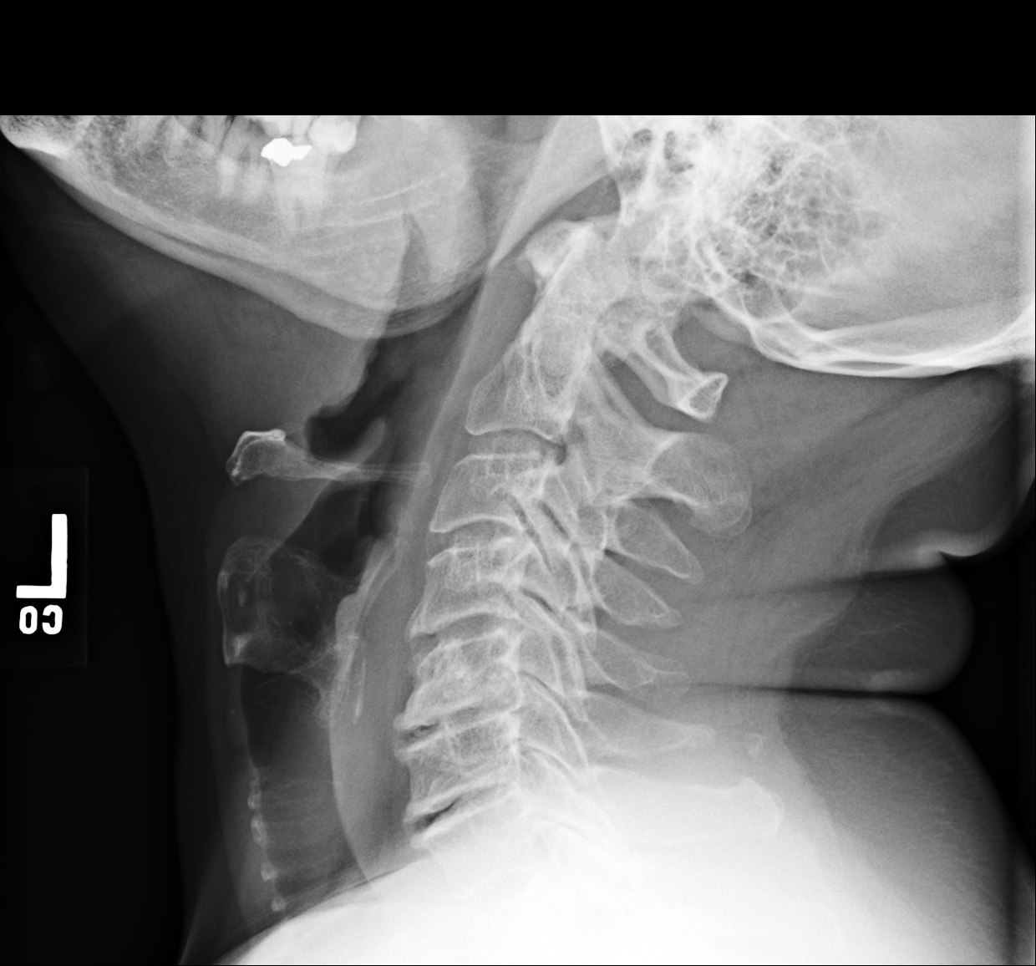
[im 5/6]
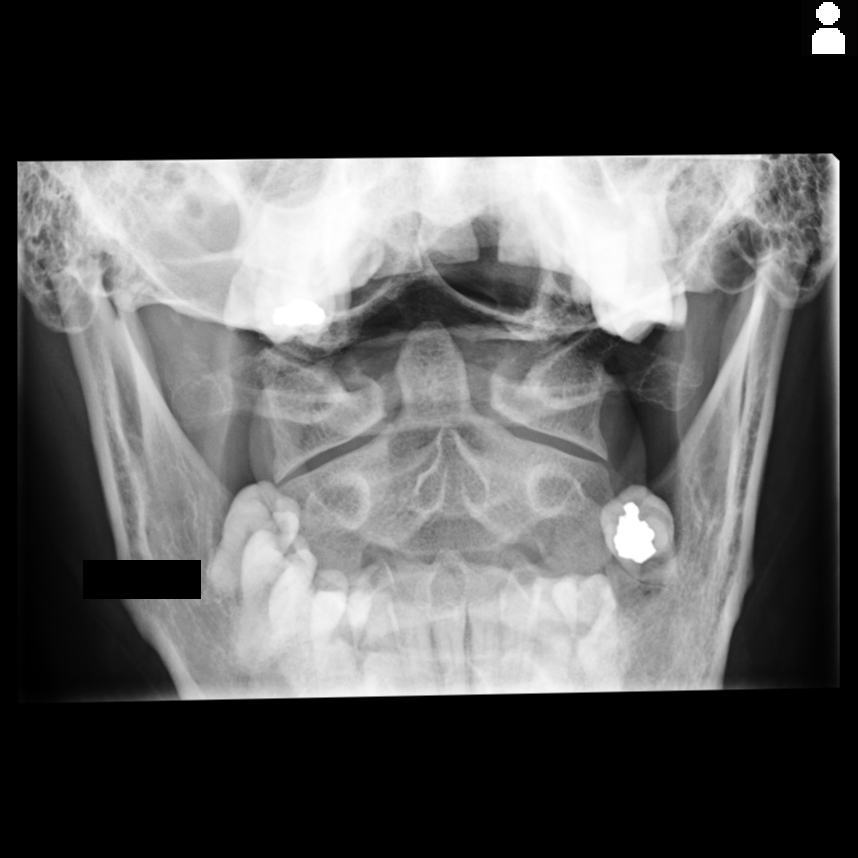
[im 6/6]
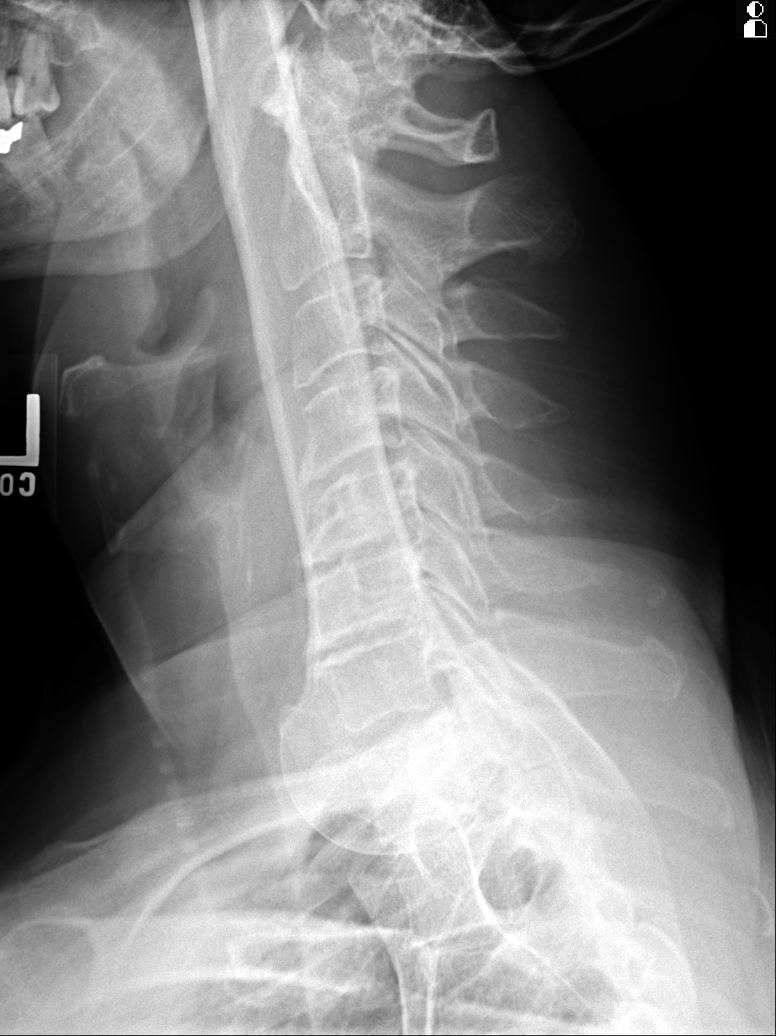

[6 of 6 positions shown; findings below may reference images not displayed]

FINDINGS: There is straightening of the cervical lordosis. No subluxations. Multilevel degenerative disc disease and osteophytosis. No significant prevertebral soft tissue prominence. No translation on dynamic images.
IMPRESSION: Multilevel spondylotic changes better delineated on concurrent MRI cervical spine from same day.

## 2019-11-15 IMAGING — CT CT CSPINE WO CONTRAST
3 of 4 series · 13 of 33 positions shown, 16 images · IV contrast (agent unspecified)
Comparison: Cervical spine radiographs from same day and cervical spine MRI 08/07/2019

HISTORY: 54-year-old female with neck/cervical pain.
TECHNIQUE: CT of the cervical spine was obtained without contrast. Axial images were obtained with [facility] coronal and sagittal reconstructions.

CONTRAST: None.

[Series 5: axial · axial · 0.24mm/px · z∈[-182,-81]mm · 5 of 82 slices shown, 7 images]
[im 14/82  soft-tissue]
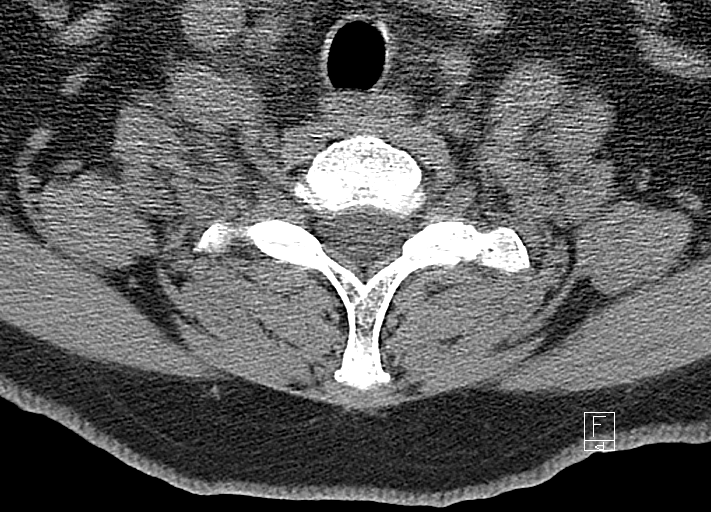
[im 14/82  bone]
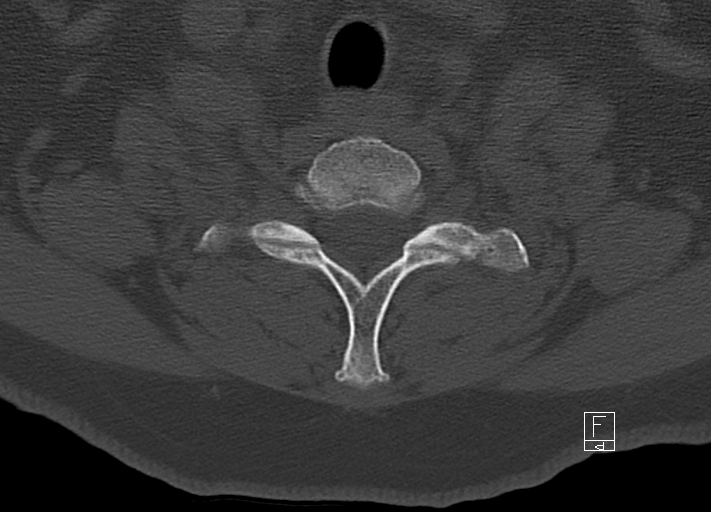
[im 28/82  bone]
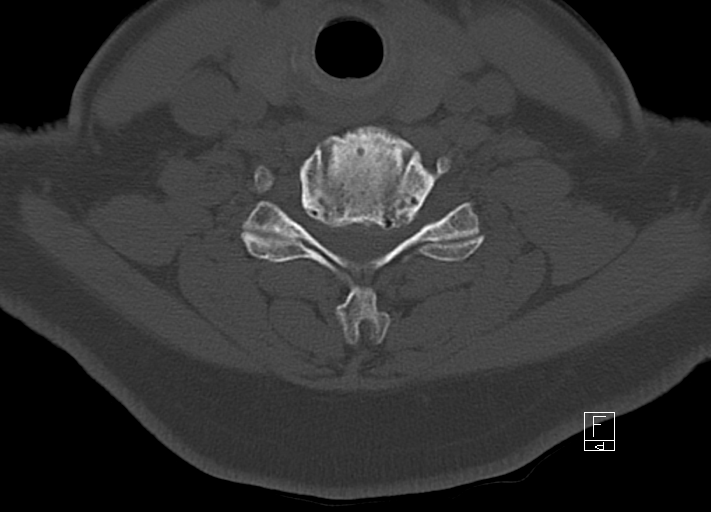
[im 41/82  bone]
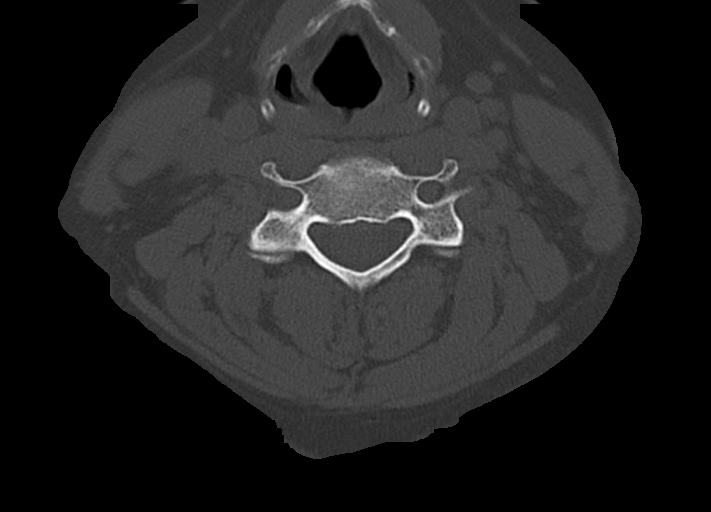
[im 55/82  bone]
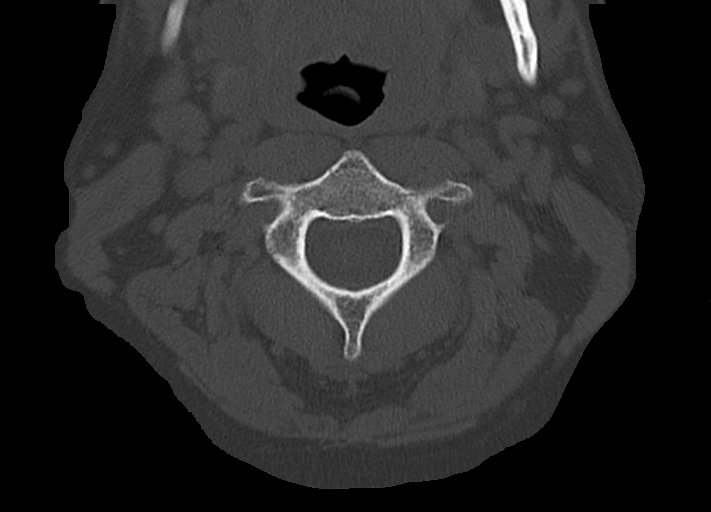
[im 68/82  soft-tissue]
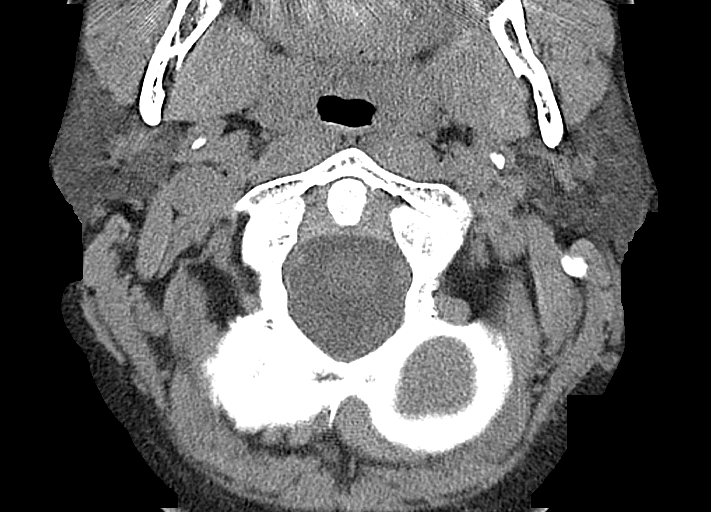
[im 68/82  bone]
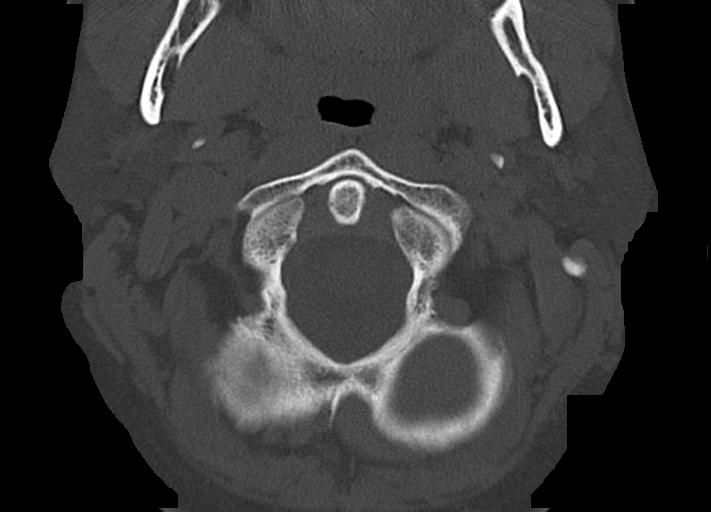

[Series 6: coronal · coronal · 0.33mm/px · 3 of 36 slices shown]
[im 8/36  bone]
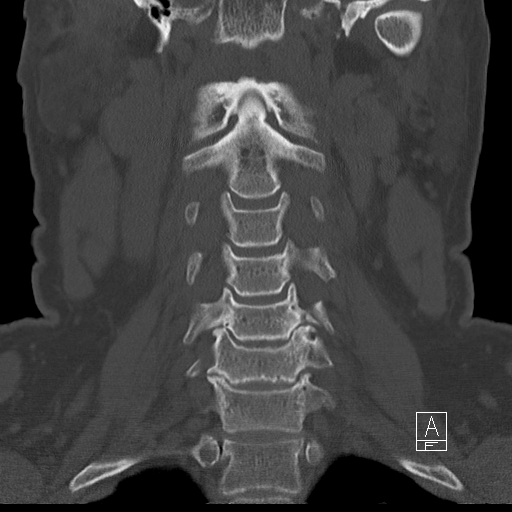
[im 15/36  bone]
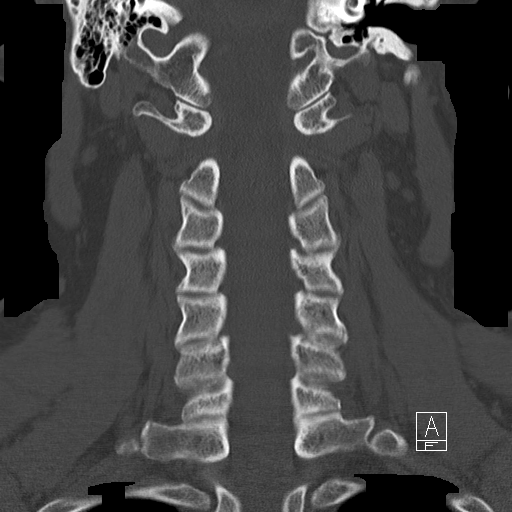
[im 22/36  bone]
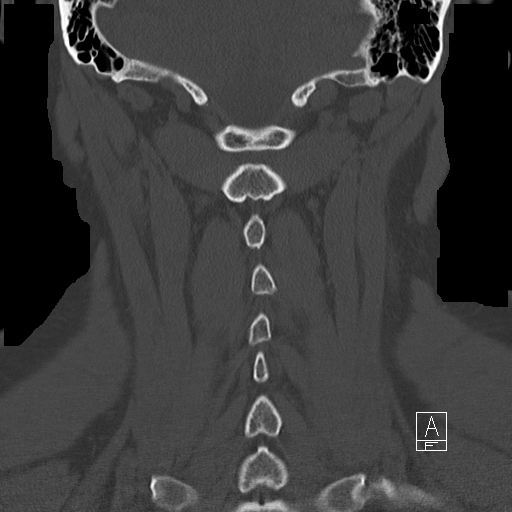

[Series 8: sagittal myelo · sagittal · 0.33mm/px · 5 of 40 slices shown, 6 images]
[im 14/40  bone]
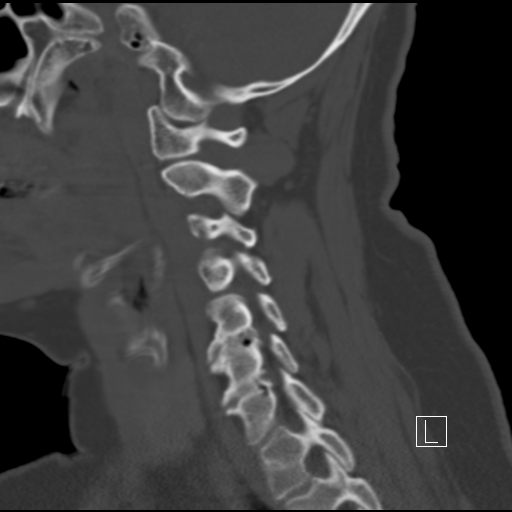
[im 17/40  bone]
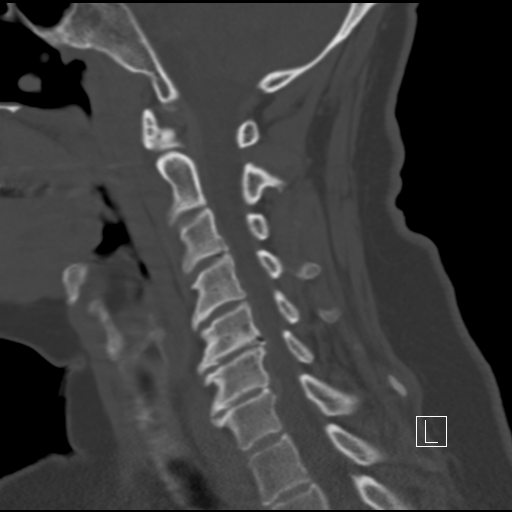
[im 20/40  soft-tissue]
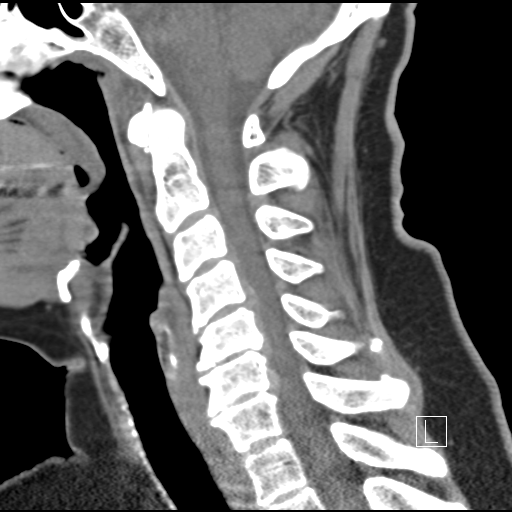
[im 20/40  bone]
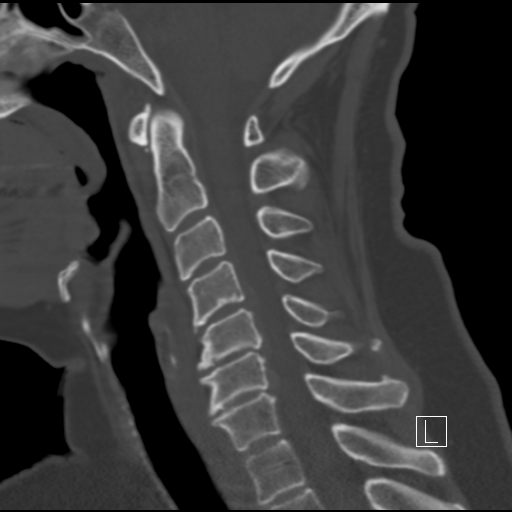
[im 23/40  bone]
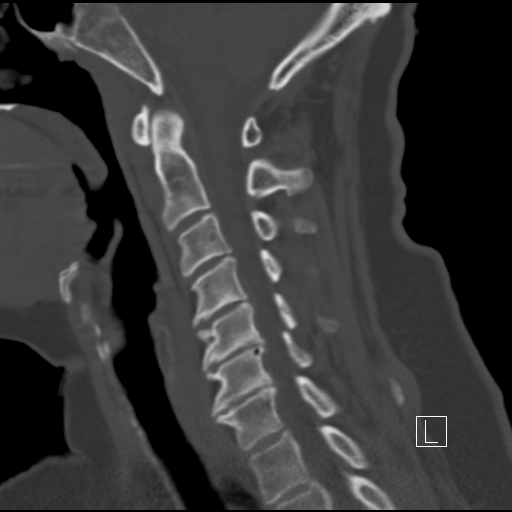
[im 27/40  bone]
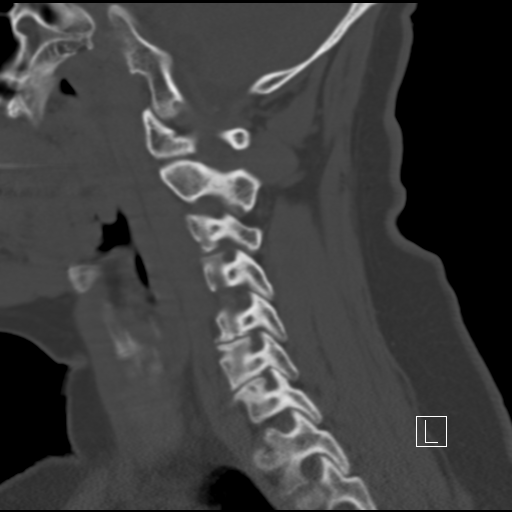

[13 of 33 positions shown; findings below may reference images not displayed]

FINDINGS: ALIGNMENT: Straightening to mild reversal of the cervical lordosis. No subluxations.

VERTEBRAE: Mild height loss and increased AP diameter of the C3-C7 vertebral bodies, most likely degenerative in etiology. 

There is thickening of the posterior longitudinal ligament at C4-6 and possible mild ossification posterior to the C5 vertebral body. No acute fracture is identified.

INTERVERTEBRAL DISCS: Multilevel degenerative disc disease. Mild height loss at C5-6 and C6-7.

PARASPINAL SOFT TISSUES: Significant paravertebral soft tissue abnormalities.

FINDINGS BY LEVEL:

C2-C3:  No high-grade osseous canal stenosis or foraminal narrowing.

C3-C4:  No high-grade osseous canal stenosis or foraminal narrowing.

C4-C5:  Disc osteophyte complex and posterior longitudinal ligament thickening contribute to moderate to severe spinal canal stenosis. No significant osseous foraminal narrowing.

C5-C6:  Uncovertebral and facet joint hypertrophy cause severe bilateral foraminal narrowing. Disc osteophyte complex and thickening/ossification of the posterior longitudinal ligament contribute to severe spinal canal stenosis.

C6-C7:  Disc osteophyte complex and posterior longitudinal ligament thickening contribute to moderate to severe spinal canal stenosis. Uncovertebral and facet joint hypertrophy cause severe right and moderate left foraminal narrowing.

C7-T1:  No high-grade osseous canal stenosis or foraminal narrowing.
IMPRESSION: 1.
There is thickening of the posterior longitudinal ligament at C4-6 with mild calcification/ossification posterior to C5 contributing to high-grade canal stenosis at those levels.

2.
Severe bilateral foraminal narrowing at C5-6 and severe right and moderate left foraminal narrowing at C6-7.

Total radiation dose to patient is CTDIvol 32.92 mGy and DLP 578.80 mGy-cm.

## 2020-06-21 IMAGING — MG MAMMO SCRN BIL W/CAD TOMO
6 of 10 series · 6 of 26 positions shown · non-contrast
Comparison: This is a baseline study.

Images Obtained from [facility]
INDICATION: Screening.
TECHNIQUE: Bilateral 2-D digital screening mammogram was performed followed by 3-D tomosynthesis.  Current study was also evaluated with a computer aided detection (CAD) system.
MAMMOGRAM FINDINGS:
There are scattered areas of fibroglandular density.
There is an oval mass in the right breast at 12 o'clock.
In the left breast, no suspicious masses, calcifications or other abnormalities are seen.

[L CC]
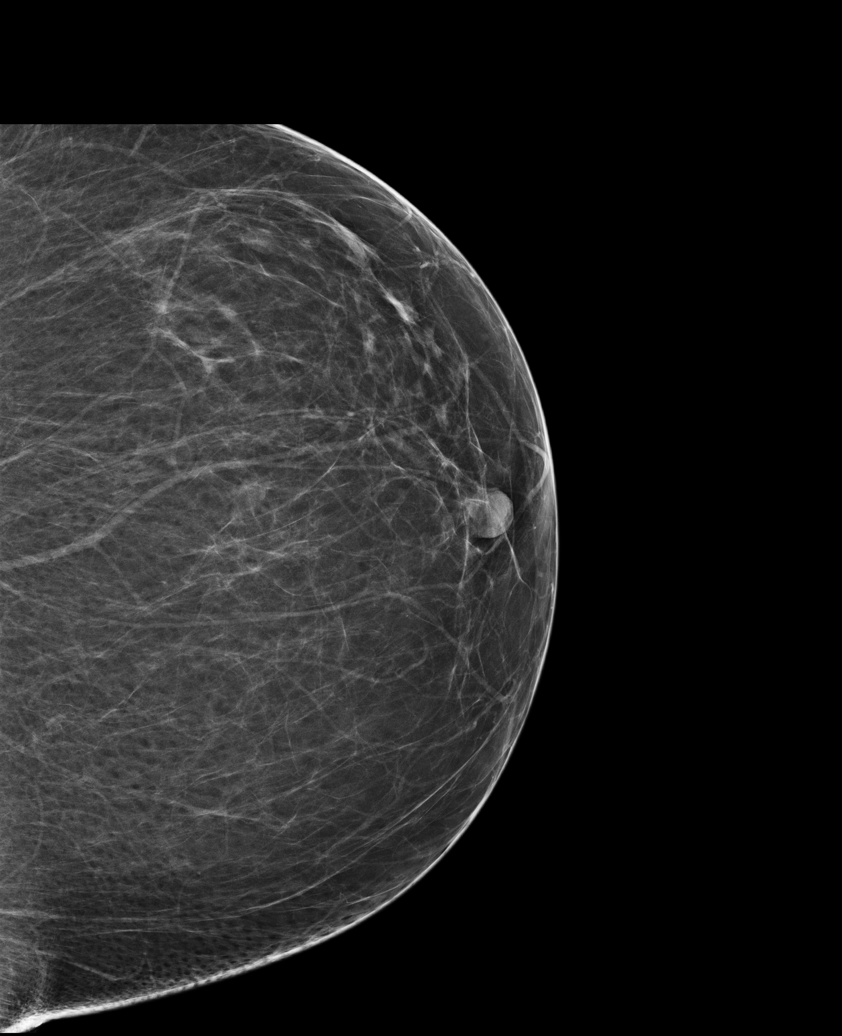

[R CC (1 of 2)]
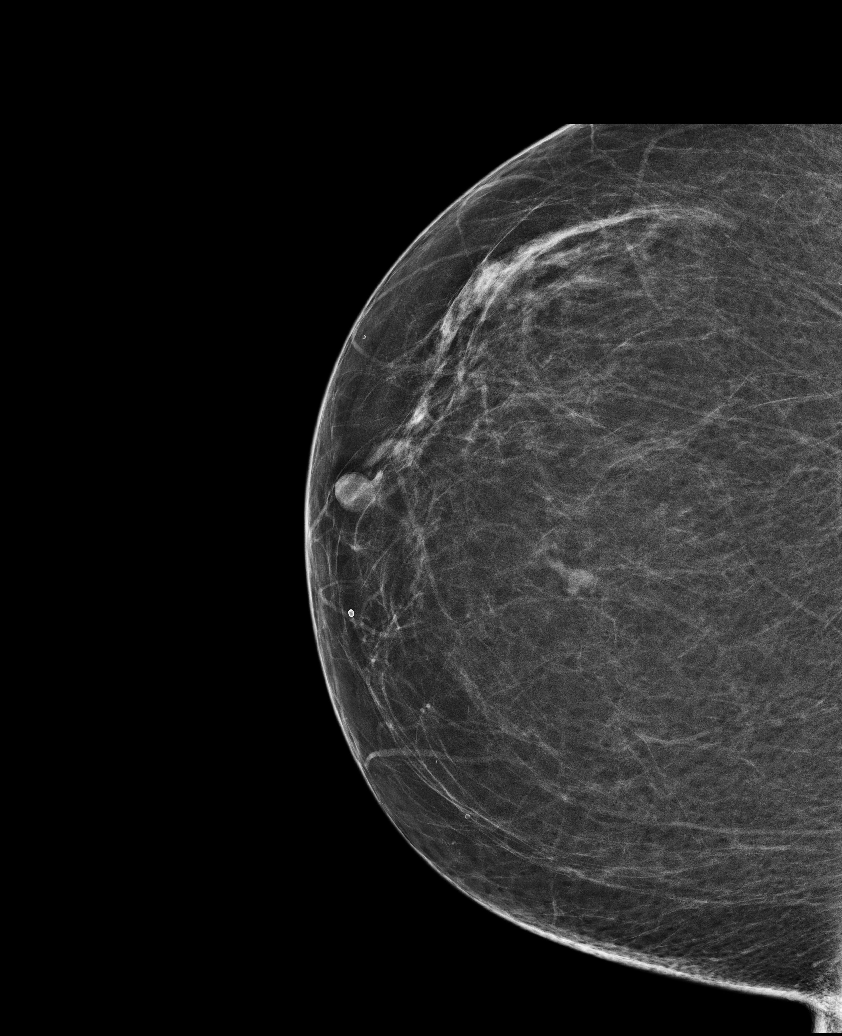

[L MLO]
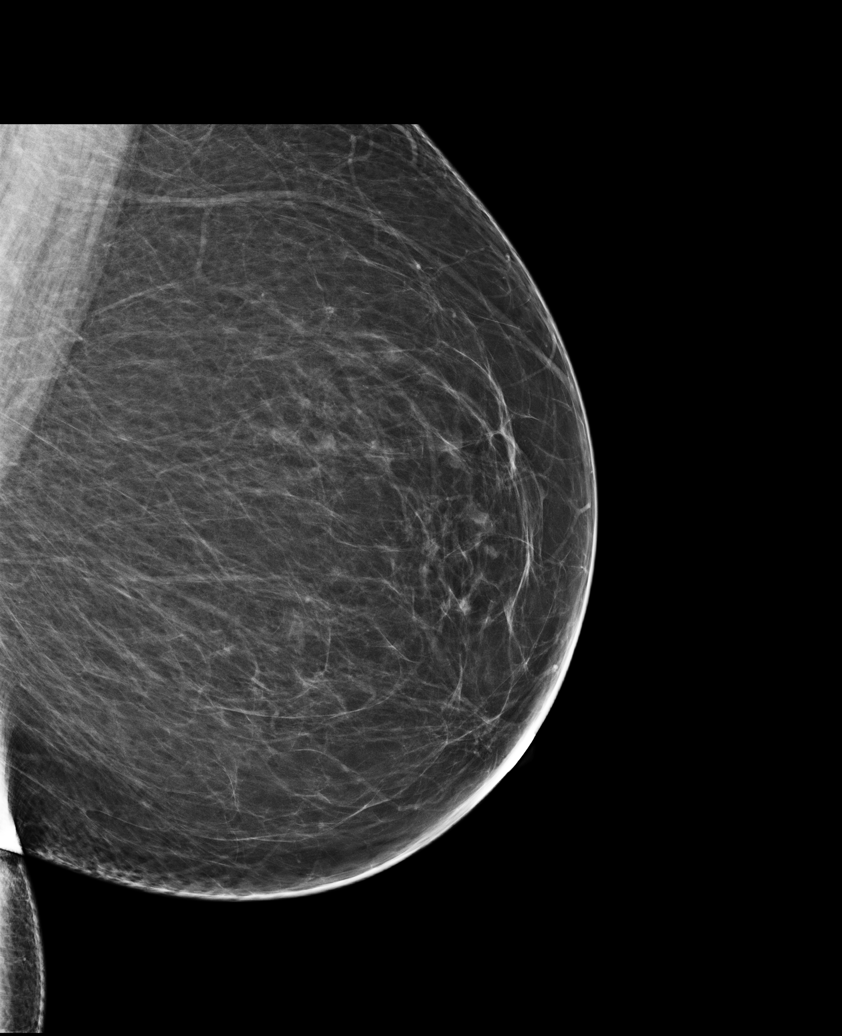

[R MLO (1 of 2)]
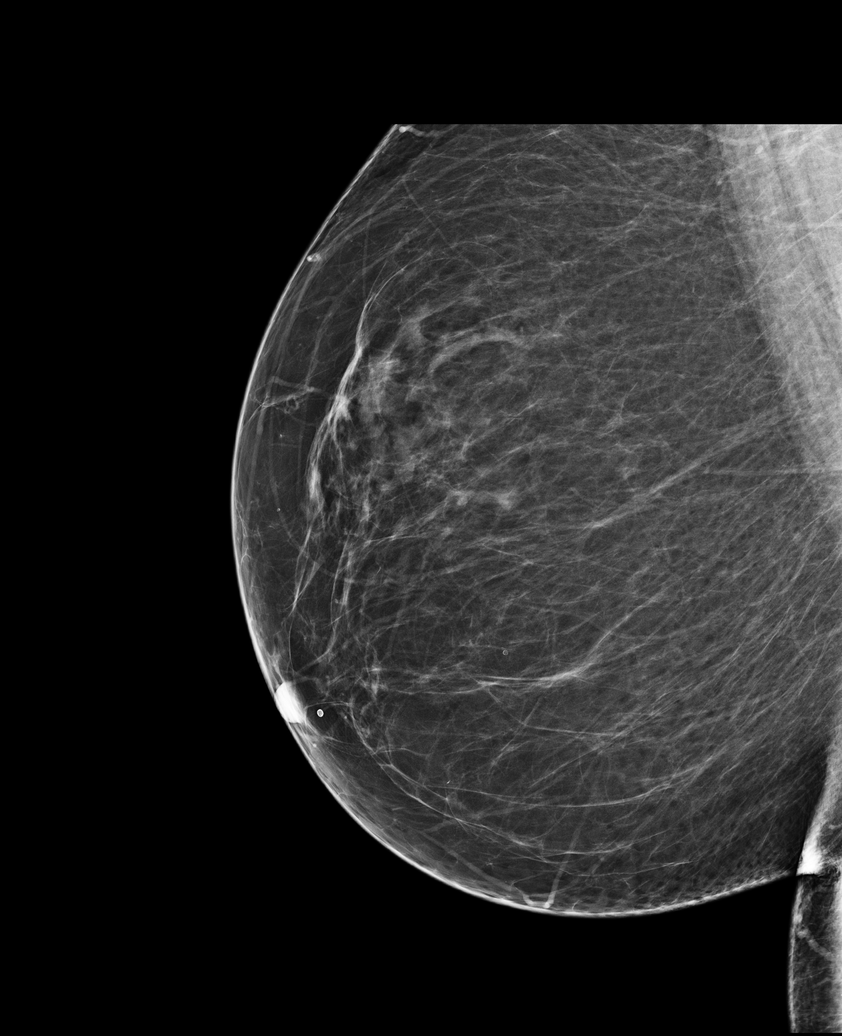

[R MLO (2 of 2)]
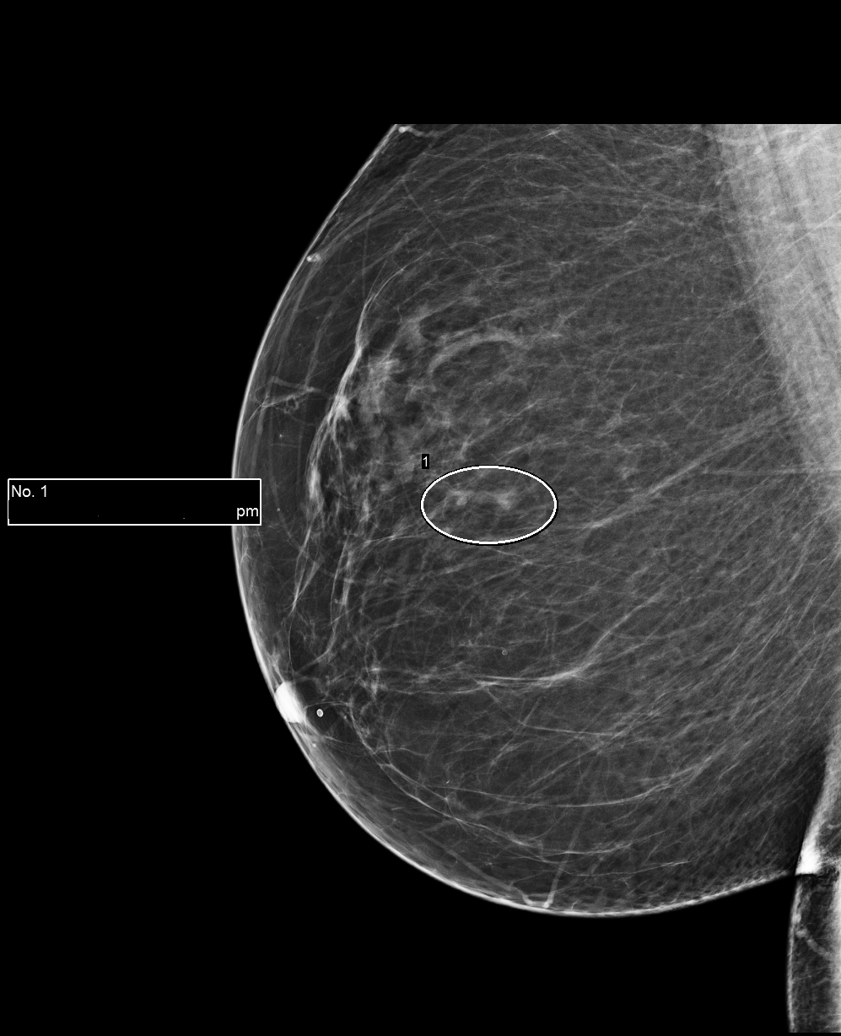

[R CC (2 of 2)]
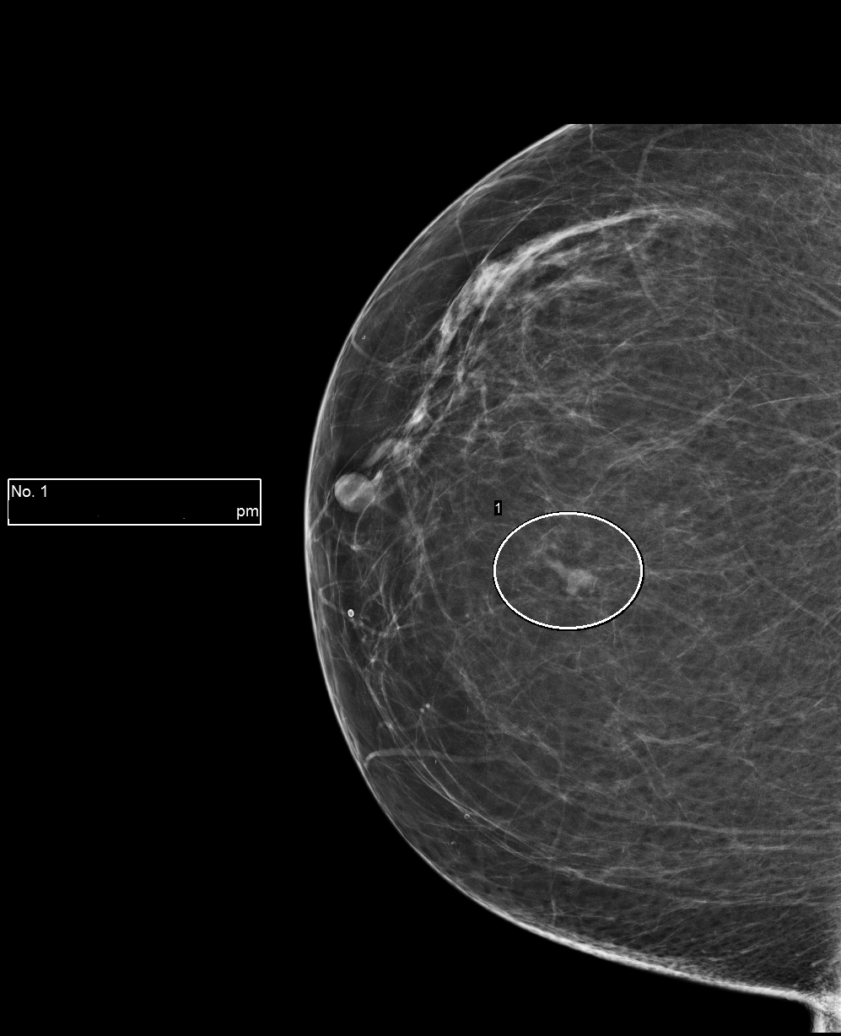

[6 of 26 positions shown; findings below may reference images not displayed]

IMPRESSION: Mass in the right breast requires additional evaluation. Ultrasound with possible diagnostic mammogram recommended.
BI-RADS Category 0: Incomplete: Needs Additional Imaging Evaluation

## 2020-07-01 IMAGING — US US BREAST RT LTD
1 series · 15 of 21 positions shown · non-contrast
Comparison: No prior imaging studies are available for comparison.

HISTORY: Patient is 54 years old and is seen for diagnostic evaluation of an abnormal mammogram in the right breast at 12 o'clock. The patient has no personal history of cancer. The patient does not have a first degree relative with breast cancer.
TECHNIQUE: Real-time targeted ultrasound of the right breast was performed.

[Series 1: us breast right ltd · 15 of 21 slices shown]
[im 1/21]
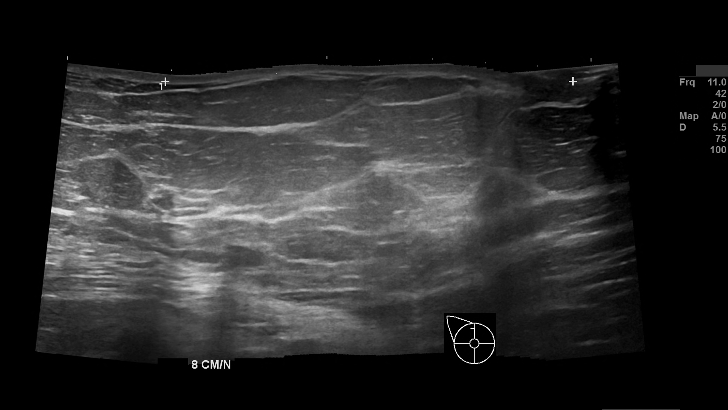
[im 3/21]
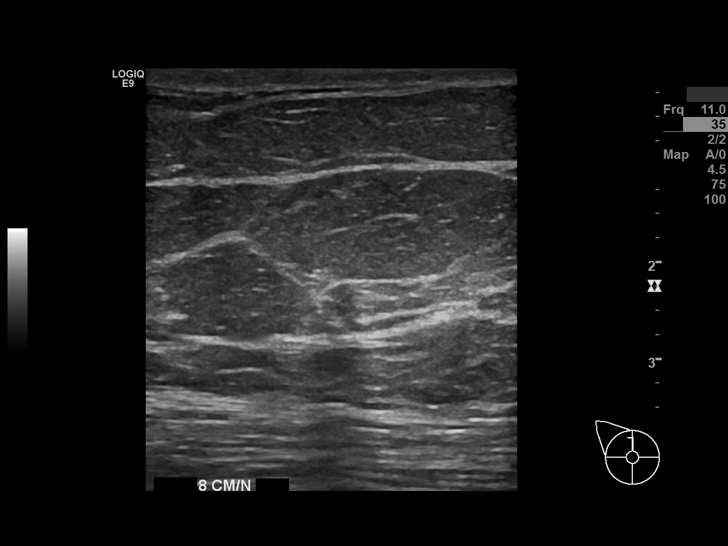
[im 4/21]
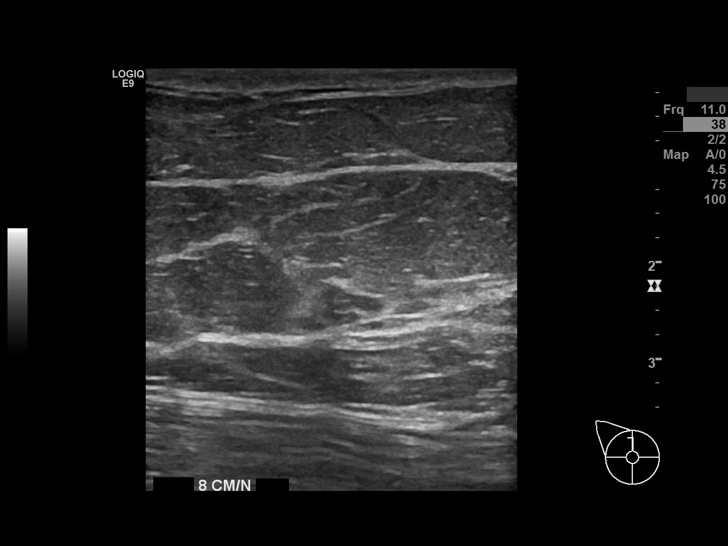
[im 5/21]
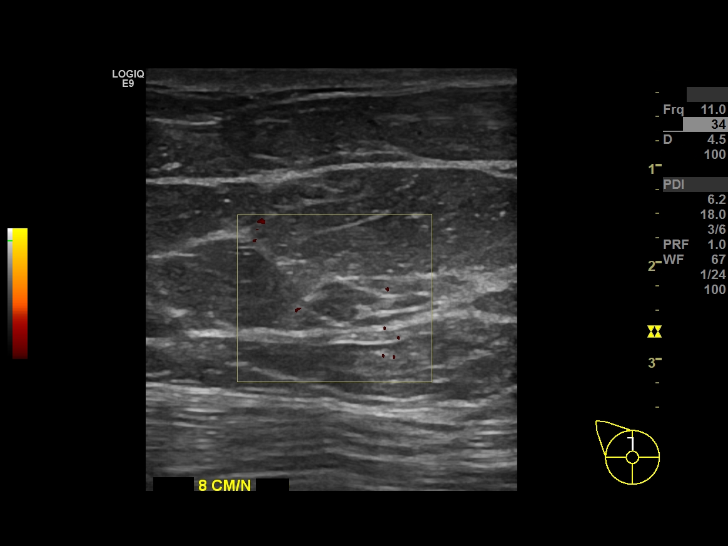
[im 7/21]
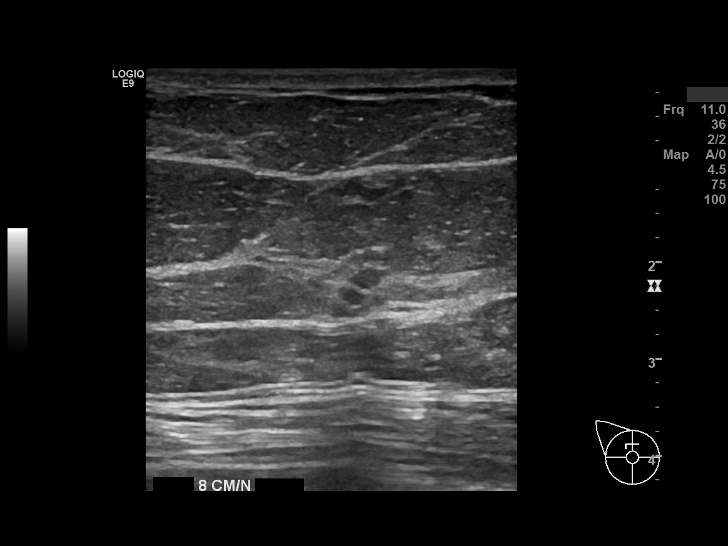
[im 8/21]
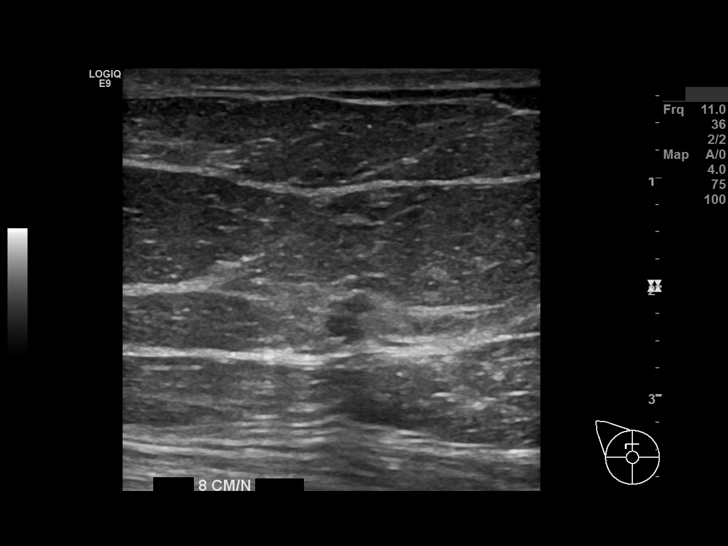
[im 10/21]
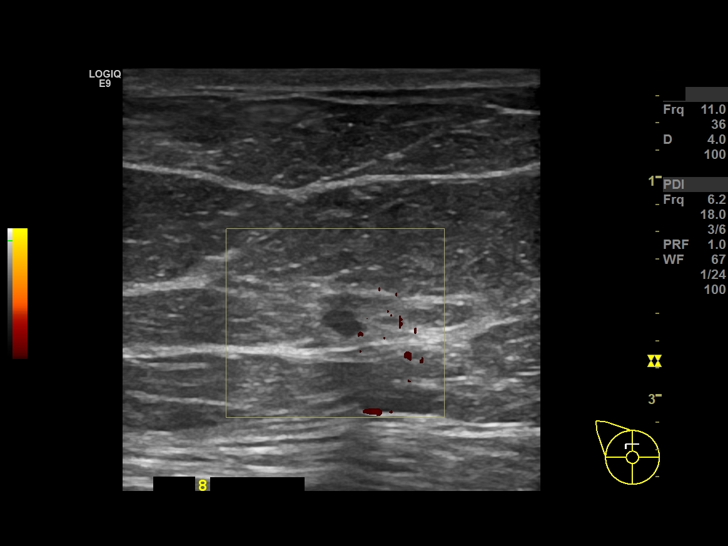
[im 11/21]
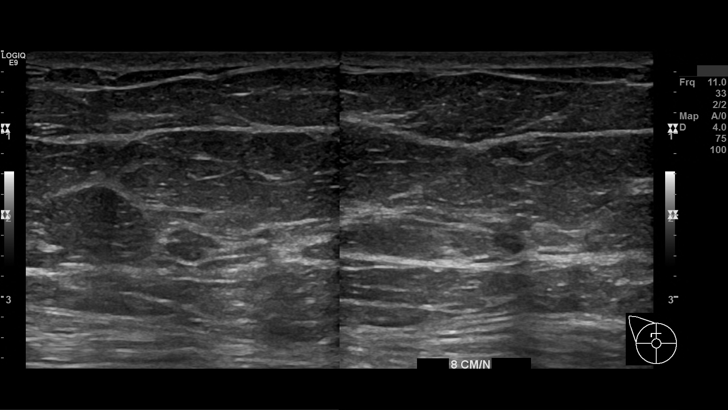
[im 12/21]
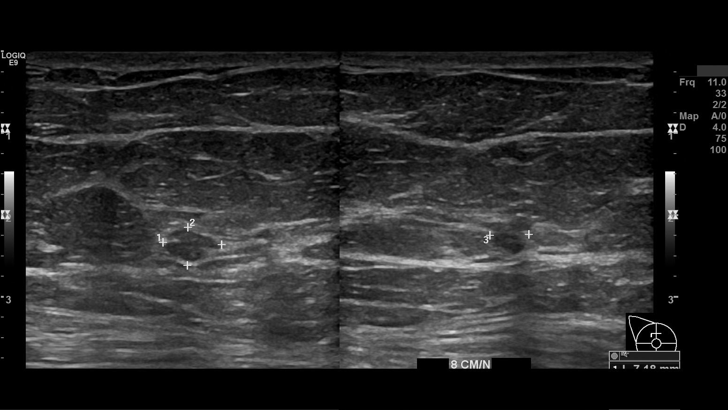
[im 14/21]
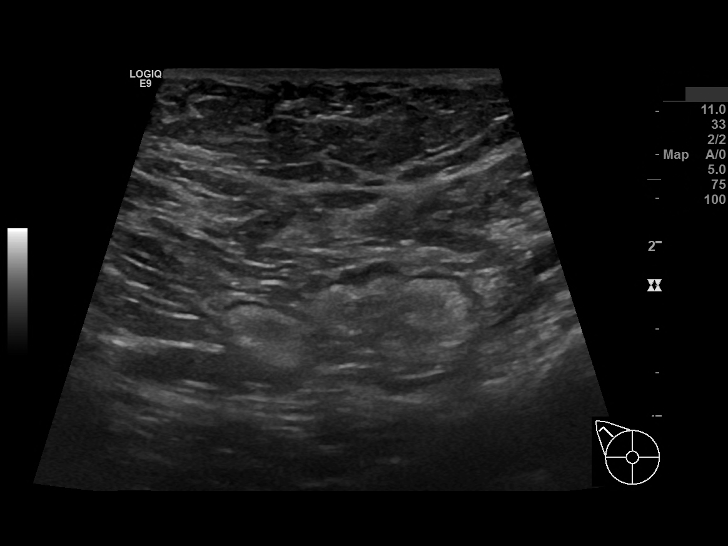
[im 15/21]
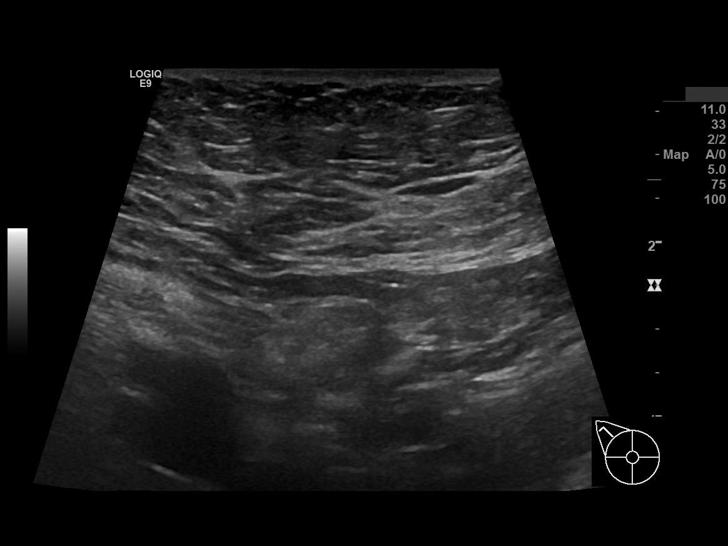
[im 17/21]
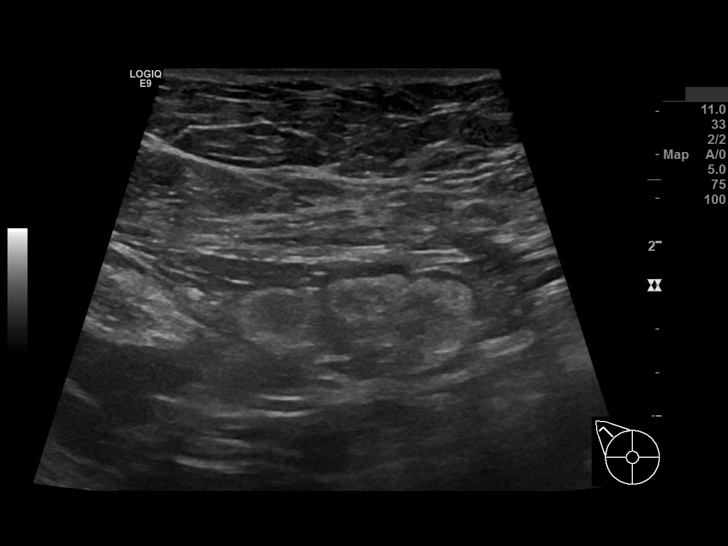
[im 18/21]
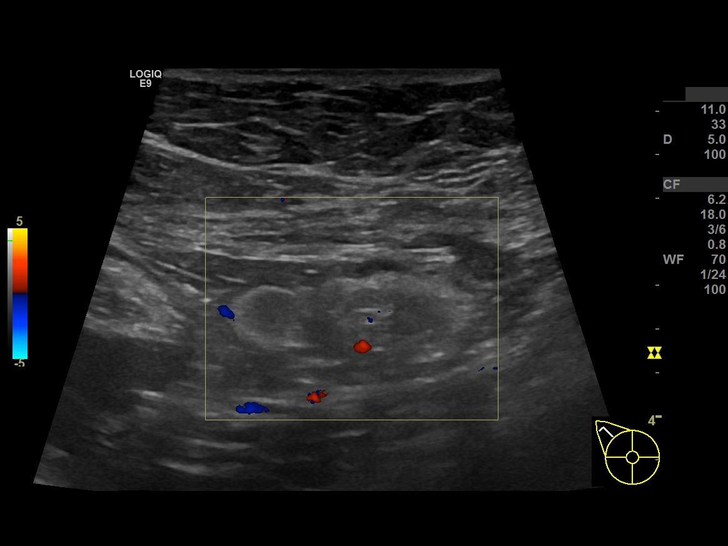
[im 19/21]
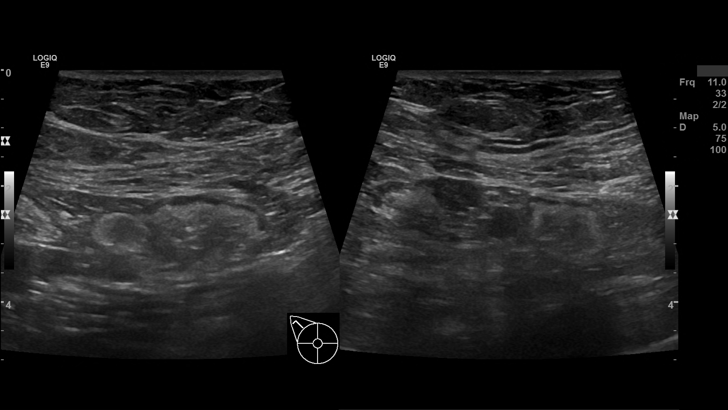
[im 21/21]
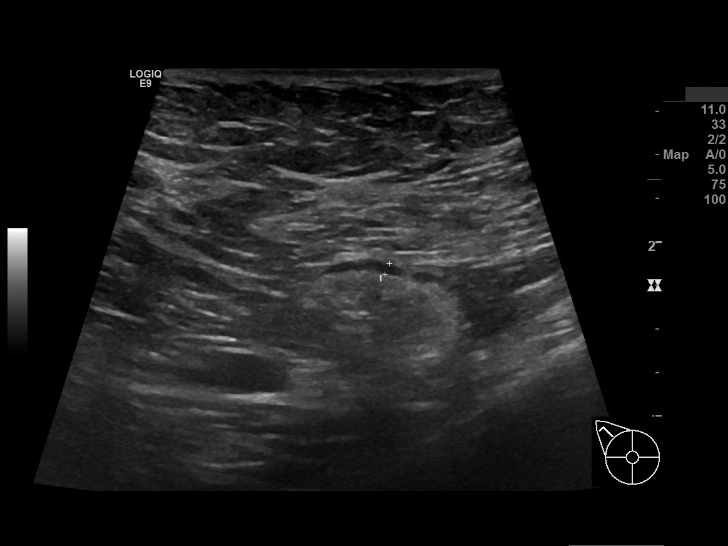

[15 of 21 positions shown; findings below may reference images not displayed]

ULTRASOUND FINDINGS:

Additional evaluation was performed for the oval mass in the right breast, 12 o'clock seen on 06/21/2020. On the present examination, there is an irregular, hypoechoic, avascular, mildly shadowing mass in the right breast at 12 o'clock, 8 cm from nipple at posterior depth. There is no right axillary adenopathy.
IMPRESSION: Irregular mass in the right breast is suspicious. An ultrasound guided biopsy is recommended.

Findings and recommendations discussed with the patient. She plans to schedule the biopsy. The patient received a copy of the results at the end of the examination.

BI-RADS Category 4: Suspicious

## 2020-07-20 IMAGING — US BX BREAST US GUIDED RT
1 series · 13 of 15 positions shown · non-contrast
Comparison: none

INDICATION: 54 years-old female referred for ultrasound-guided core needle biopsy of right breast mass.

[Series 1: bx breast us guided right · 13 of 15 slices shown]
[im 1/15]
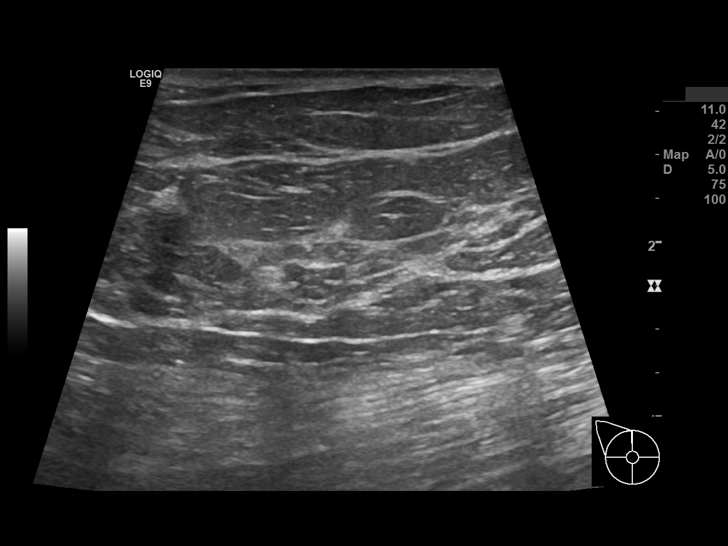
[im 2/15]
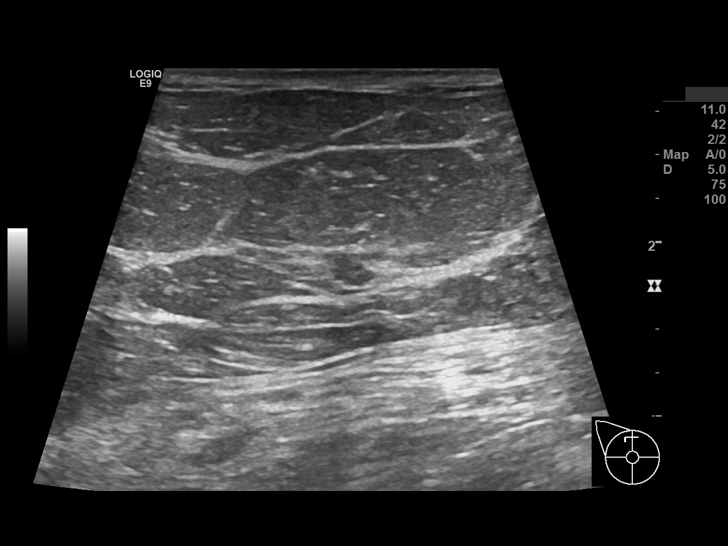
[im 3/15]
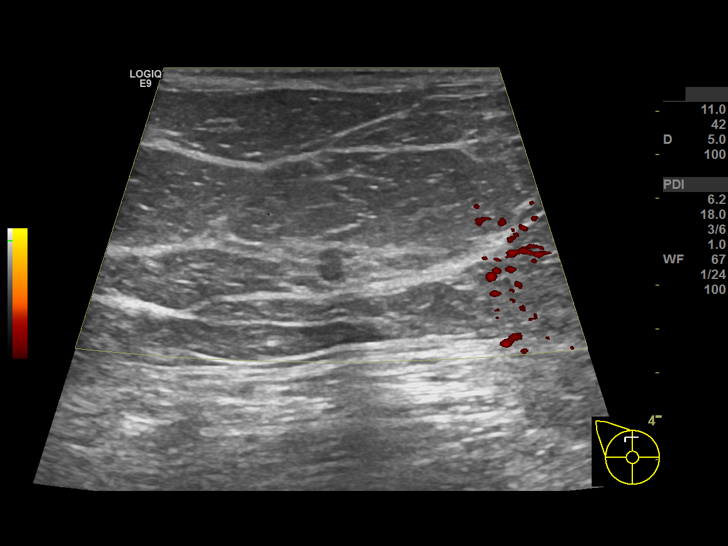
[im 5/15]
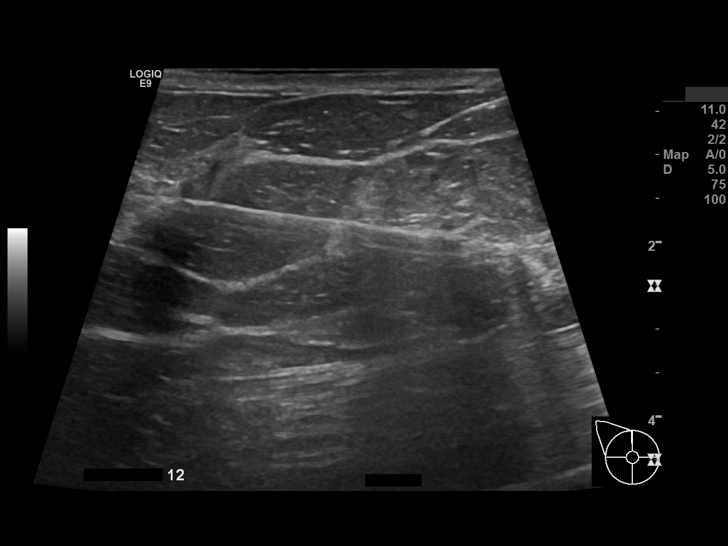
[im 6/15]
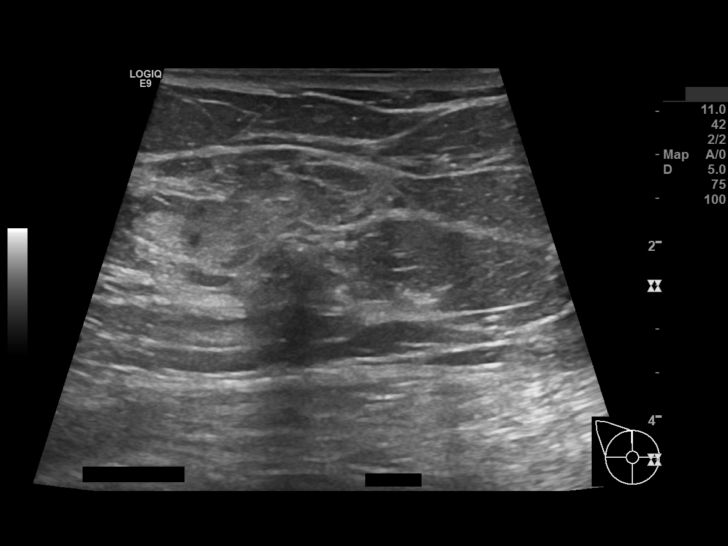
[im 7/15]
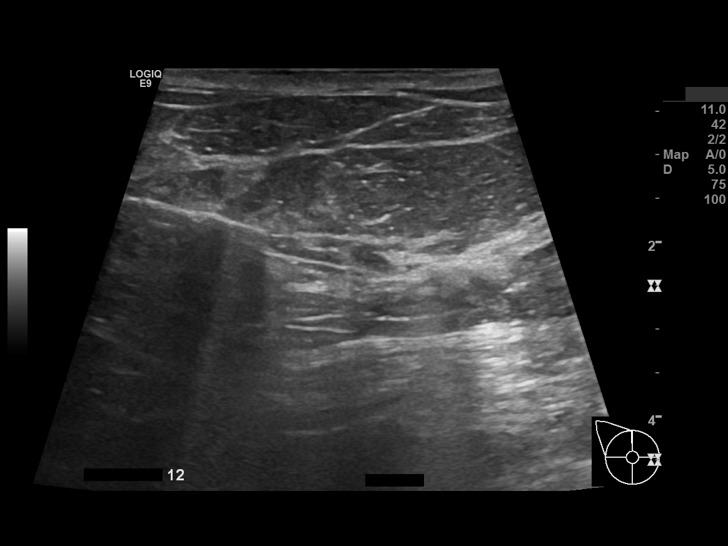
[im 8/15]
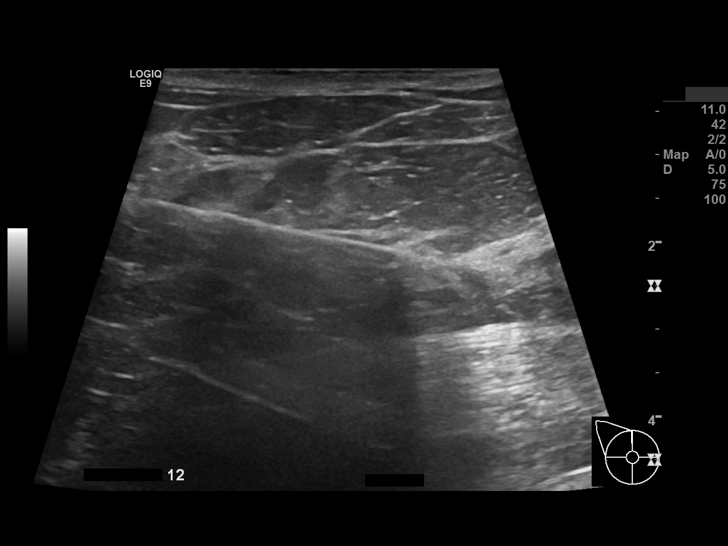
[im 9/15]
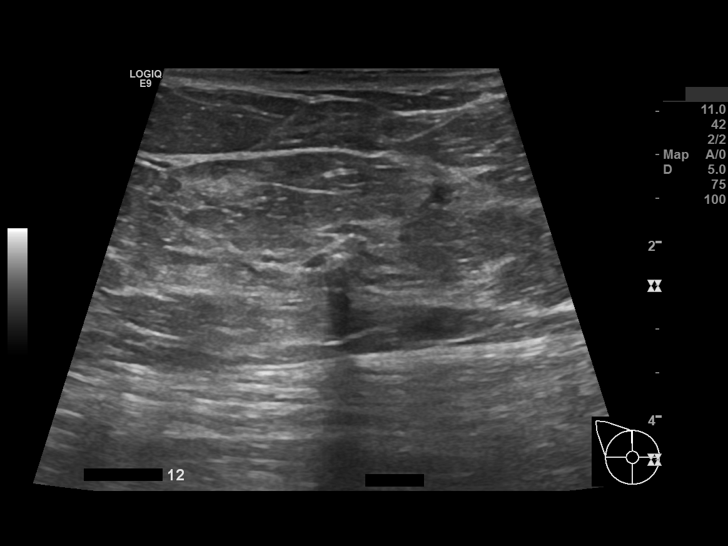
[im 10/15]
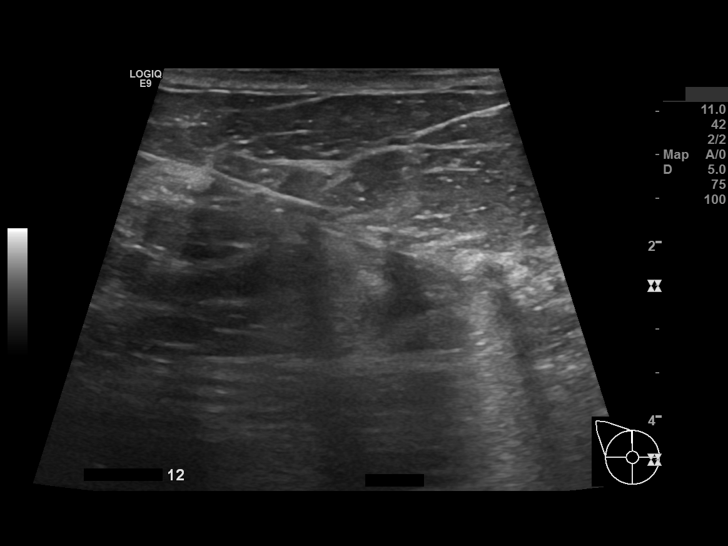
[im 11/15]
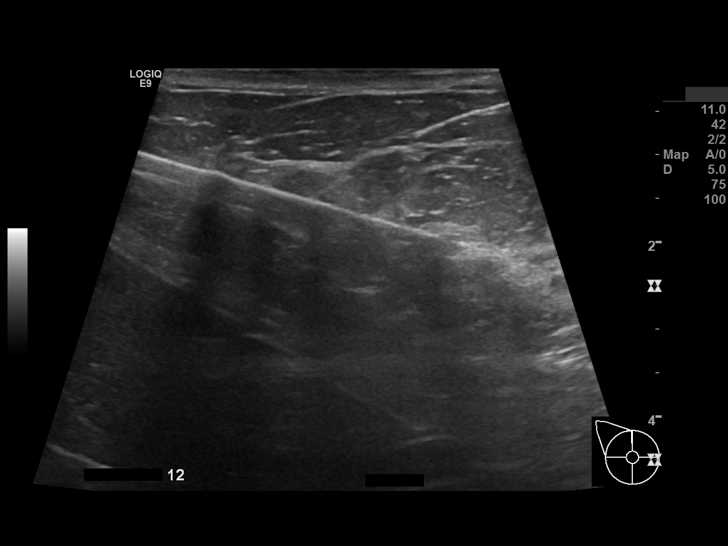
[im 13/15]
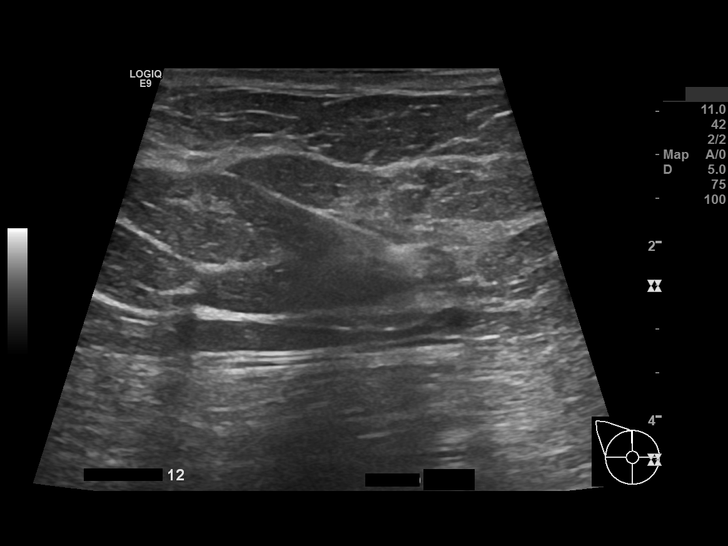
[im 14/15]
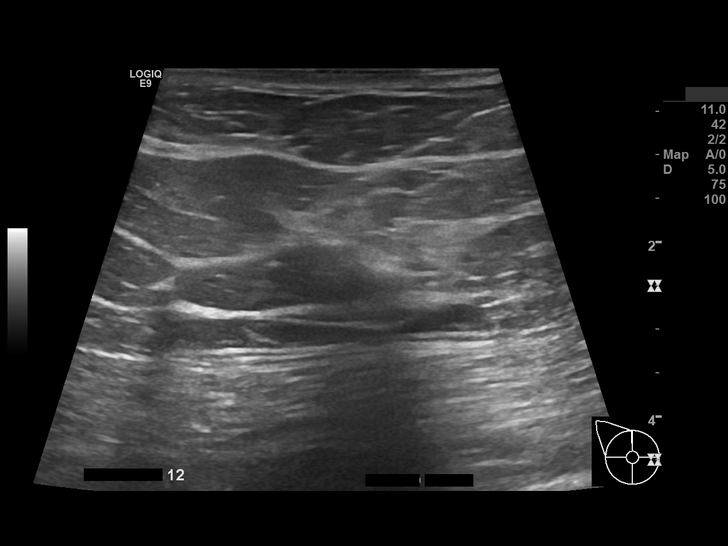
[im 15/15]
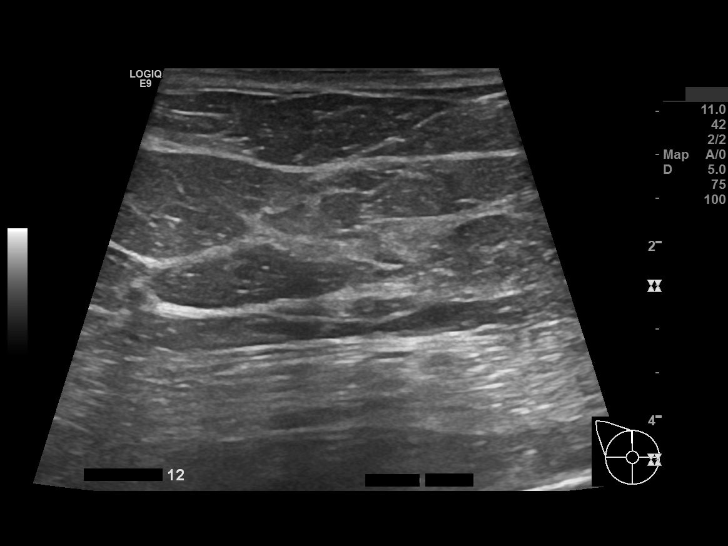

[13 of 15 positions shown; findings below may reference images not displayed]

PATIENT CONSENT: Prior to the procedure, the risks, benefits, and alternatives to the procedure was discussed with the patient. A description of the procedure was reviewed. All questions were answered. Written and oral informed consent was obtained.

PROCEDURE DESCRIPTION:  

An ultrasound guided biopsy using real-time ultrasound was performed for the 7 x 5 x 5 mm mass in the right breast at 12 o'clock, 8 cm from nipple.  This was described on the previous ultrasound report.  The skin was prepped in the usual manner.  Local anesthetic was administered to the access site.  A skin nick was made with a blade.  A 14 gauge Marquee biopsy needle was placed adjacent to the abnormality under ultrasound guidance.  Once the needle was documented to be in the correct location, three specimens were obtained under real-time ultrasound guidance and placed in formalin for histologic analysis.  A clip was inserted into the biopsy cavity.  Hemostasis was achieved with manual compression. Sterile skin strips were applied to the access site.

Post procedure sonographic and digital mammographic imaging demonstrates the clip at the appropriate targeted area.  The specimens were sent to the laboratory for pathological analysis.  The patient was given appropriate aftercare instructions and discharged in good condition. A female technologist was present throughout the procedure.
IMPRESSION: BENIGN

1. Ultrasound guided biopsy of the 7 x 5 x 5 mm mass in the right breast at 12 o'clock, 8 cm from nipple was successful with no apparent post procedure complications.  

2. Pathology indicates benign hyalinized fibroadenoma. Pathology results are concordant with imaging findings. Recommend return to routine screening annual mammography.  

3.     Results and recommendations were discussed with the patient.

## 2020-07-20 IMAGING — MG MAMMO DIAG RT
2 series · 2 of 2 positions shown · non-contrast
Comparison: none

INDICATION: 54 years-old female referred for ultrasound-guided core needle biopsy of right breast mass.

[R CC]
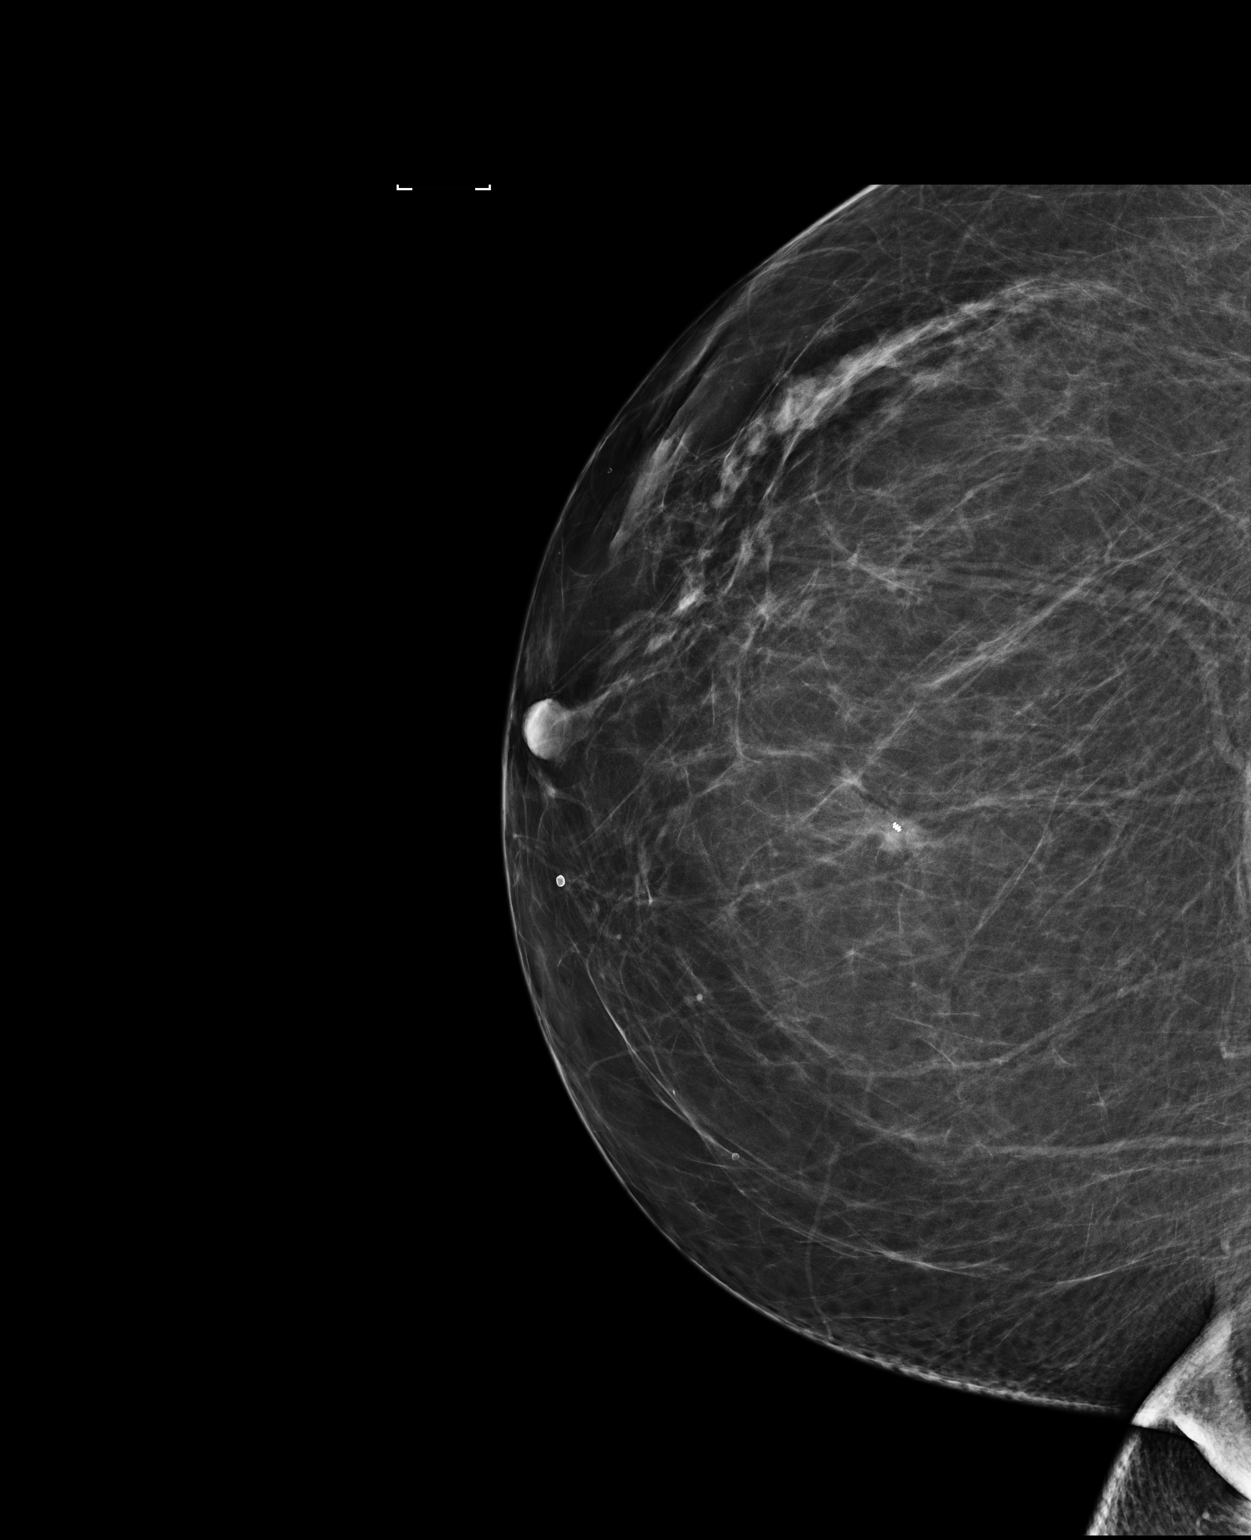

[R LM]
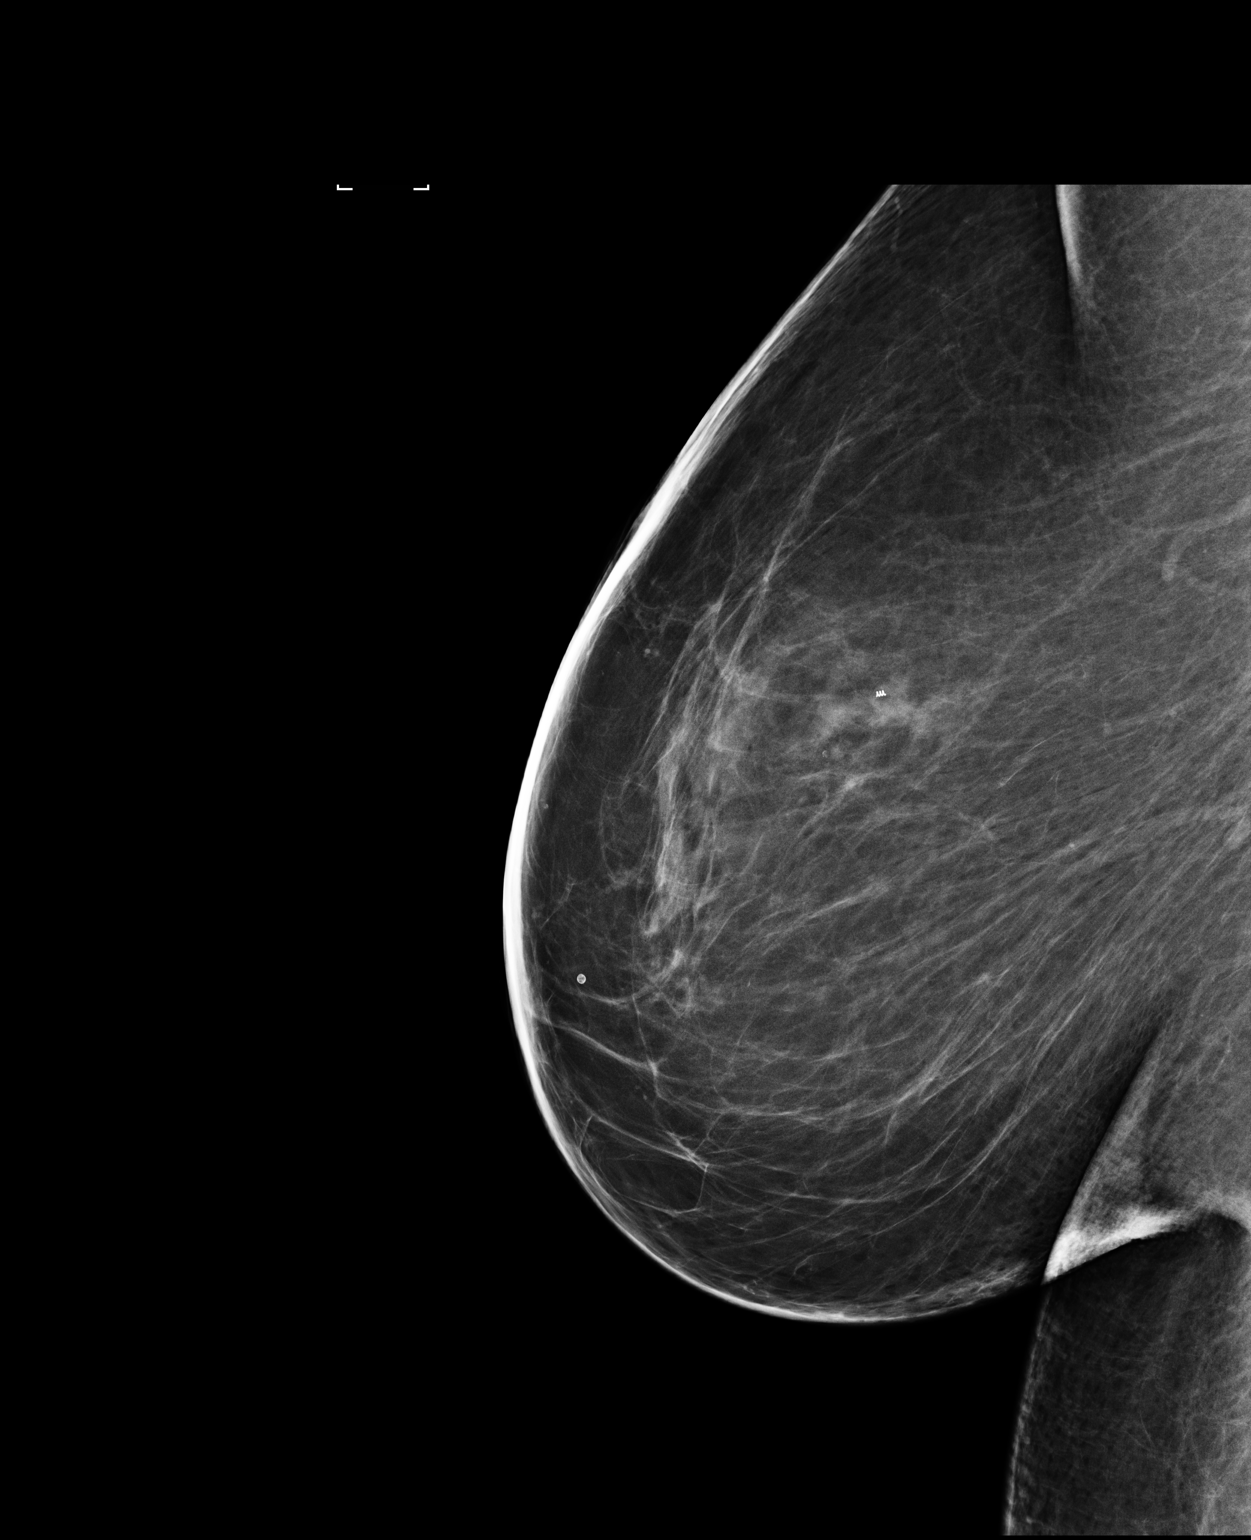

[2 of 2 positions shown; findings below may reference images not displayed]

PATIENT CONSENT: Prior to the procedure, the risks, benefits, and alternatives to the procedure was discussed with the patient. A description of the procedure was reviewed. All questions were answered. Written and oral informed consent was obtained.

PROCEDURE DESCRIPTION:  

An ultrasound guided biopsy using real-time ultrasound was performed for the 7 x 5 x 5 mm mass in the right breast at 12 o'clock, 8 cm from nipple.  This was described on the previous ultrasound report.  The skin was prepped in the usual manner.  Local anesthetic was administered to the access site.  A skin nick was made with a blade.  A 14 gauge Marquee biopsy needle was placed adjacent to the abnormality under ultrasound guidance.  Once the needle was documented to be in the correct location, three specimens were obtained under real-time ultrasound guidance and placed in formalin for histologic analysis.  A clip was inserted into the biopsy cavity.  Hemostasis was achieved with manual compression. Sterile skin strips were applied to the access site.

Post procedure sonographic and digital mammographic imaging demonstrates the clip at the appropriate targeted area.  The specimens were sent to the laboratory for pathological analysis.  The patient was given appropriate aftercare instructions and discharged in good condition. A female technologist was present throughout the procedure.
IMPRESSION: BENIGN

1. Ultrasound guided biopsy of the 7 x 5 x 5 mm mass in the right breast at 12 o'clock, 8 cm from nipple was successful with no apparent post procedure complications.  

2. Pathology indicates benign hyalinized fibroadenoma. Pathology results are concordant with imaging findings. Recommend return to routine screening annual mammography.  

3.     Results and recommendations were discussed with the patient.

## 2020-10-04 IMAGING — MR MRI CSPINE WO CONTRAST
5 series · 42 of 48 positions shown · non-contrast
Comparison: 08/07/2019 MRI cervical spine

HISTORY: Cervical radiculopathy
TECHNIQUE: Routine multiplanar MRI of the cervical spine was performed without IV contrast.

[Series 1: bSSFP · axial · 6.0mm · 1.17mm/px · z∈[-51,+142]mm · 12 of 22 slices shown]
[im 1/22]
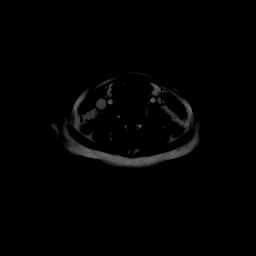
[im 2/22]
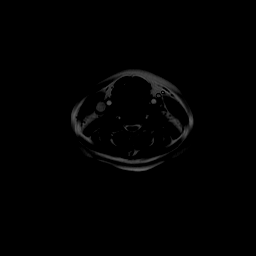
[im 4/22]
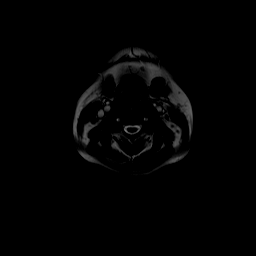
[im 6/22]
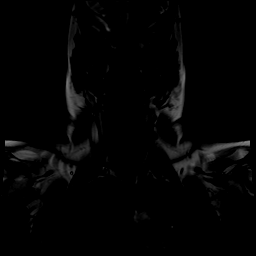
[im 8/22]
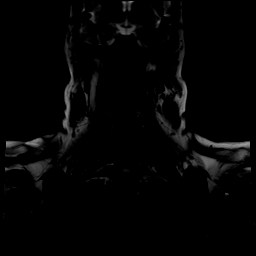
[im 10/22]
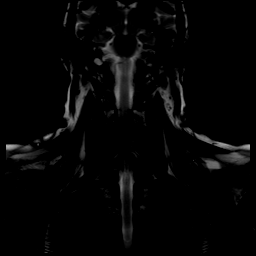
[im 12/22]
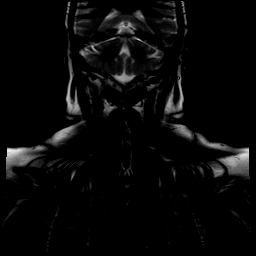
[im 14/22]
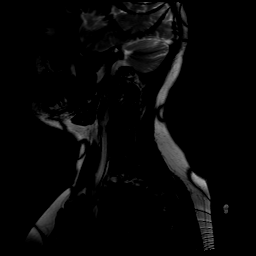
[im 16/22]
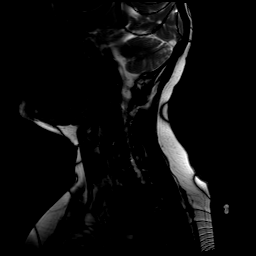
[im 18/22]
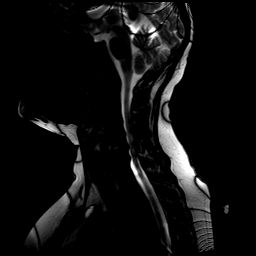
[im 20/22]
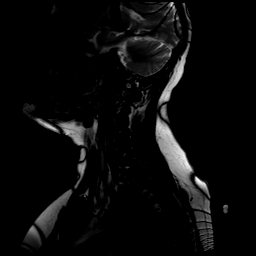
[im 22/22]
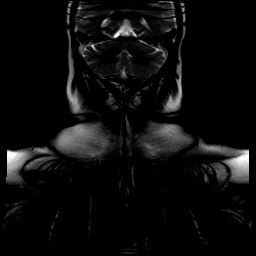

[Series 2: t2_sag · sagittal · 3.0mm · 0.62mm/px · 7 of 14 slices shown]
[im 1/14]
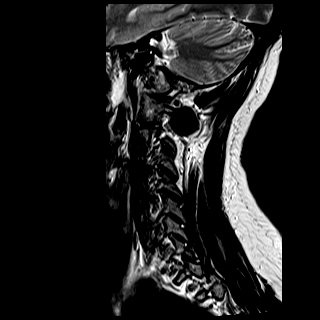
[im 3/14]
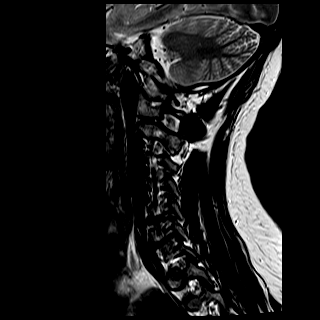
[im 5/14]
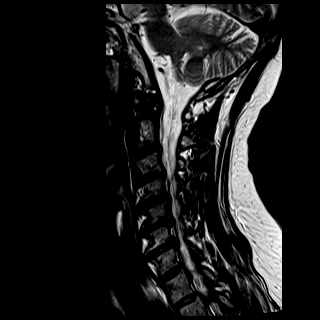
[im 7/14]
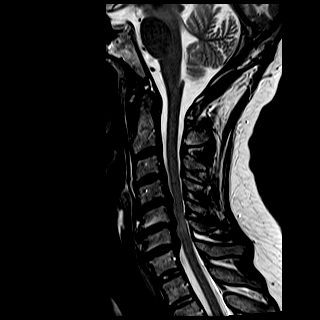
[im 9/14]
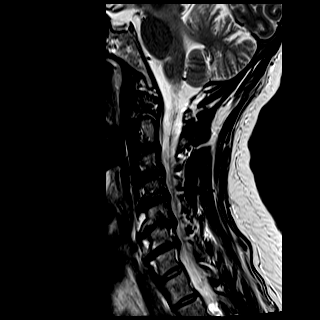
[im 11/14]
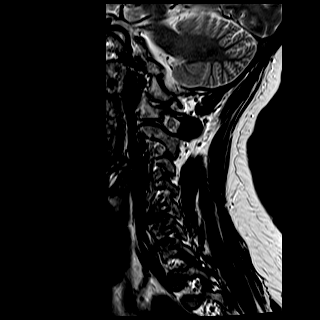
[im 14/14]
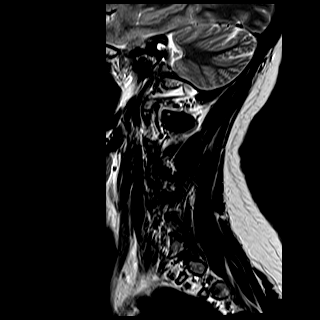

[Series 3: t1_sag · sagittal · 3.0mm · 0.62mm/px · 7 of 14 slices shown]
[im 1/14]
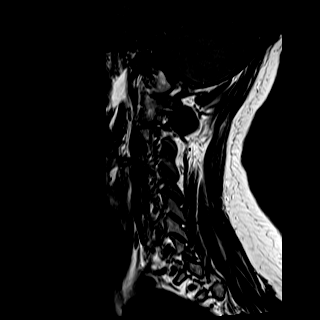
[im 3/14]
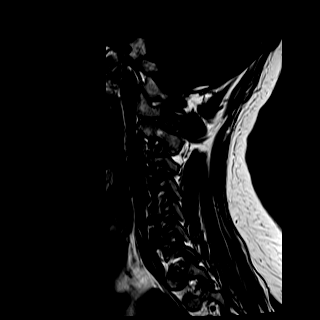
[im 5/14]
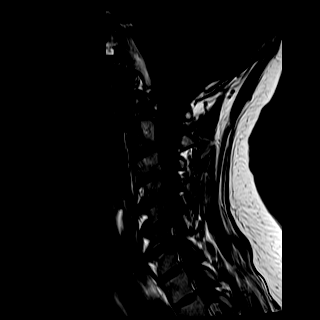
[im 7/14]
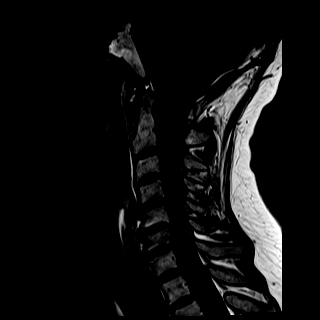
[im 9/14]
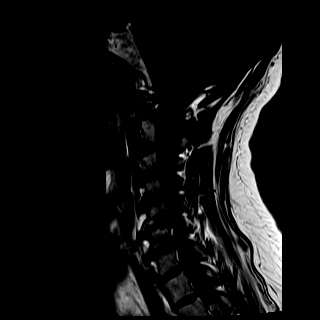
[im 11/14]
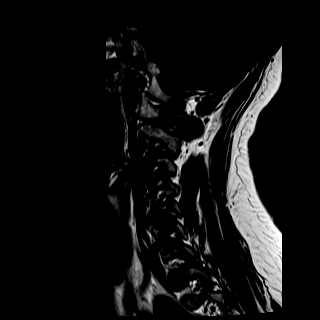
[im 14/14]
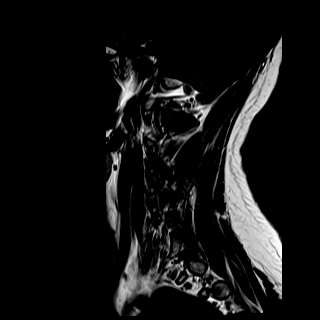

[Series 4: ir_sag · sagittal · 3.0mm · 0.78mm/px · 7 of 14 slices shown]
[im 1/14]
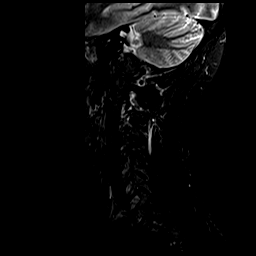
[im 3/14]
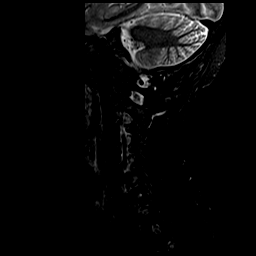
[im 5/14]
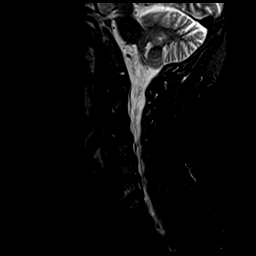
[im 7/14]
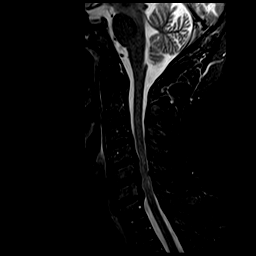
[im 9/14]
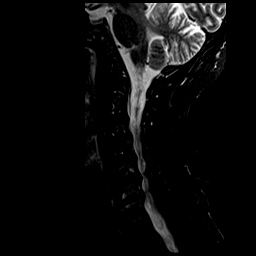
[im 11/14]
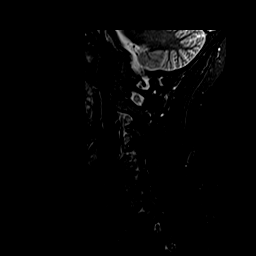
[im 14/14]
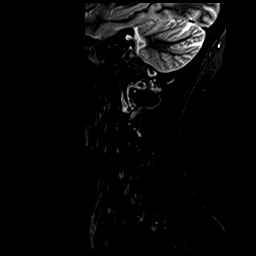

[Series 5: t2_medic_axial · axial · 3.0mm · 0.25mm/px · z∈[-82,+26]mm · 9 of 29 slices shown]
[im 1/29]
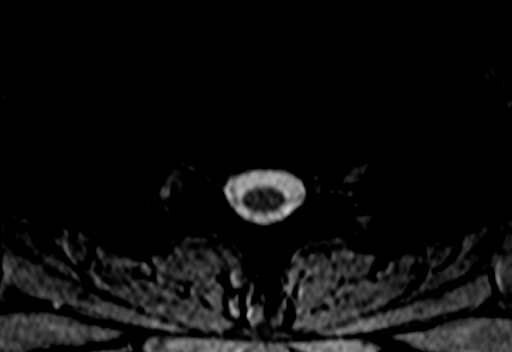
[im 3/29]
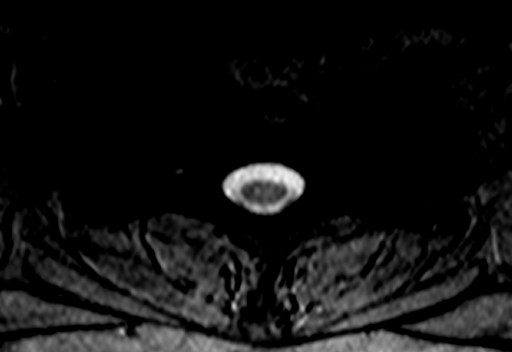
[im 5/29]
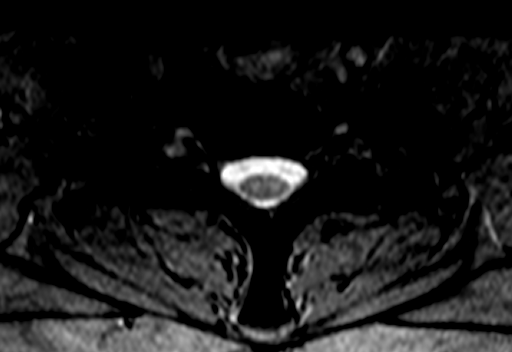
[im 9/29]
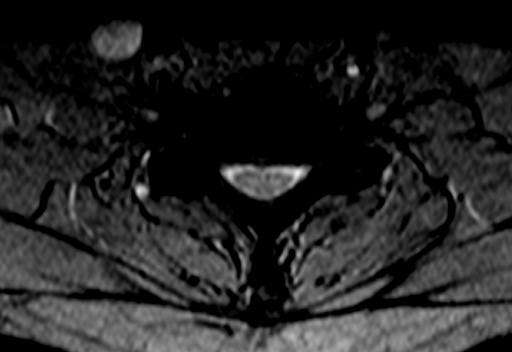
[im 13/29]
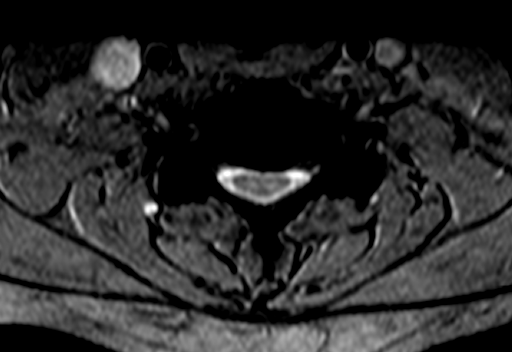
[im 17/29]
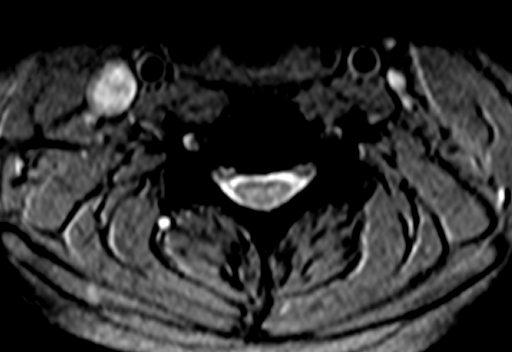
[im 21/29]
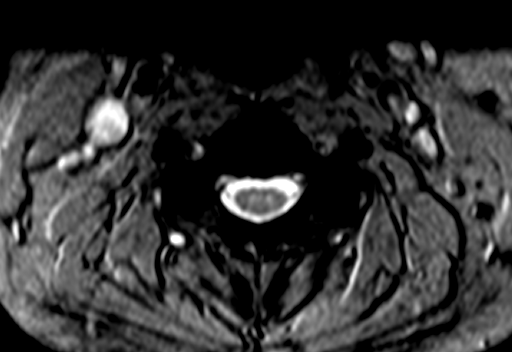
[im 25/29]
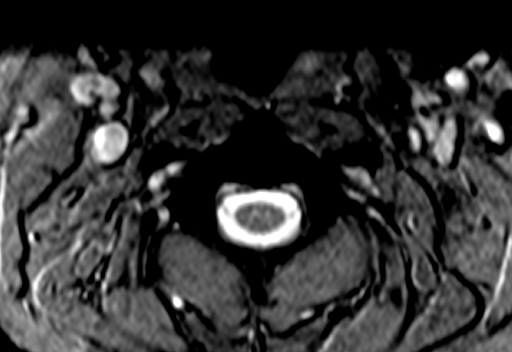
[im 29/29]
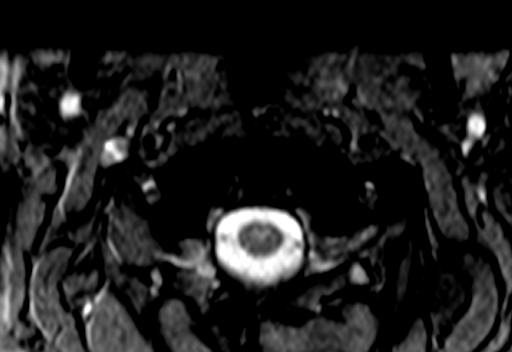

[42 of 48 positions shown; findings below may reference images not displayed]

FINDINGS: Cervical segments maintain appropriate alignment. No fractures. No destructive lesions. There is multilevel degenerative disc desiccation, loss of disc space height and concentric disc osteophyte complex. Thickening of the posterior longitudinal ligament is noted similar to previous. Subtle focus of cord edema at the level of the superior endplate of C6. No abnormal signal present in a paraspinous position. Brain stem maintains normal signal intensity.

C2-3: No disc bulge or herniation. No canal or foraminal stenosis.

C3-4: Mild disc osteophyte. Mild facet arthropathy and uncovertebral osteophytosis. Mild canal and foraminal stenoses.

C4-5: Moderate disc osteophyte. Thickening/ossification of the posterior longitudinal ligament. Moderate facet arthropathy. Moderate canal and mild to moderate bilateral foraminal stenoses. Stable.

C5-6: Moderate disc osteophyte. Thickening/ossification of the posterior longitudinal ligament. Bilateral facet arthropathy and uncovertebral osteophytosis. Severe canal stenosis and severe bilateral foraminal stenoses, stable. There is a subtle degree of cord edema just below the disc plane at the level of the superior endplate of C6, new comparing to prior.

C6-7: Moderate disc osteophyte. Thickening/ossification of the posterior longitudinal ligament. Mild to moderate facet arthropathy. Moderate canal stenosis. Severe right foraminal stenosis and moderate left foraminal stenosis. Slightly more advanced than seen on prior.

C7-T1: No disc bulge or herniation. No canal or foraminal stenosis.
IMPRESSION: Thickening/ossification of the posterior longitudinal ligament with superimposed multilevel degenerative disc disease, facet arthropathy and uncovertebral osteophytosis. Above factors combine to produce multilevel canal and foraminal stenoses as detailed above. Most notably, severe canal and severe bilateral foraminal stenoses at C5-6. Interval development of a subtle degree of cord edema at the level of the superior endplate of C6 best appreciated on axial image 20.

IMPORTANT FINDINGS!

Code fax

## 2021-02-16 IMAGING — CR C-SPINE 2 or 3 views
1 series · 6 of 6 positions shown · non-contrast
Comparison: 11/15/2019

Cervical spine radiographs, 6 views
INDICATION: Neck pain. Postop cervical spine.

[Series 1: lat · 0.17mm/px · 6 of 6 slices shown]
[im 1/6]
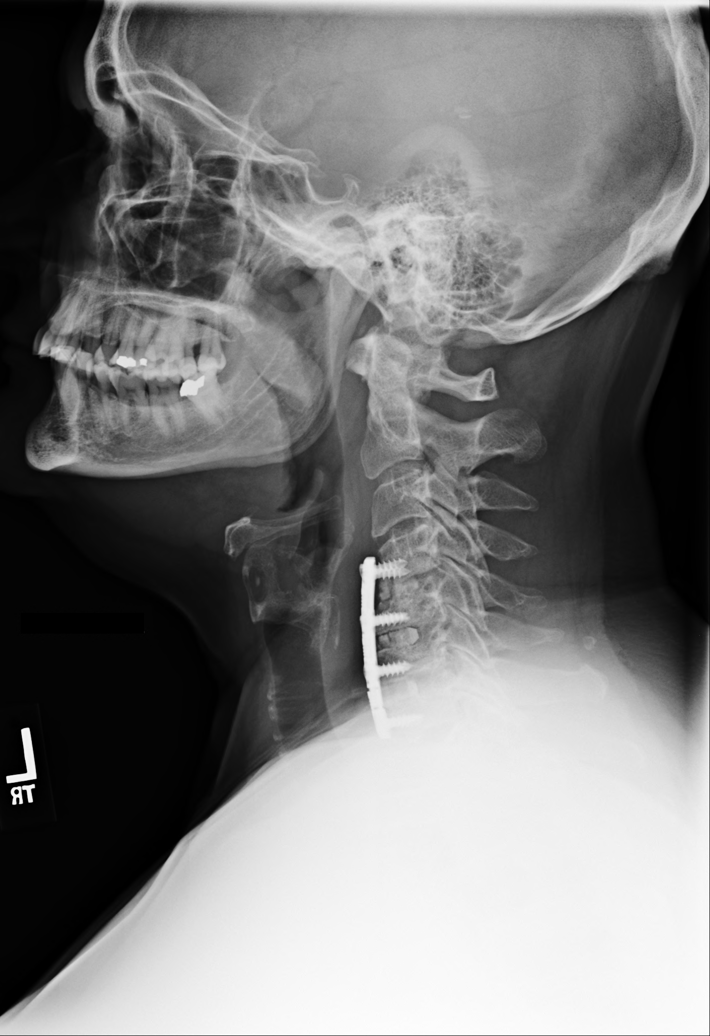
[im 2/6]
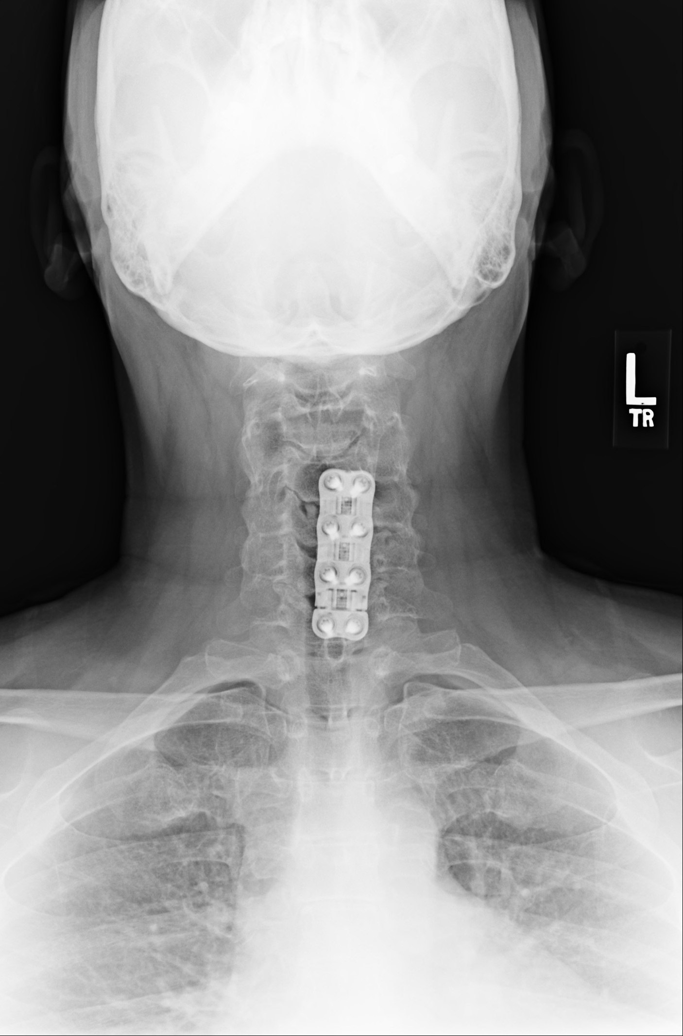
[im 3/6]
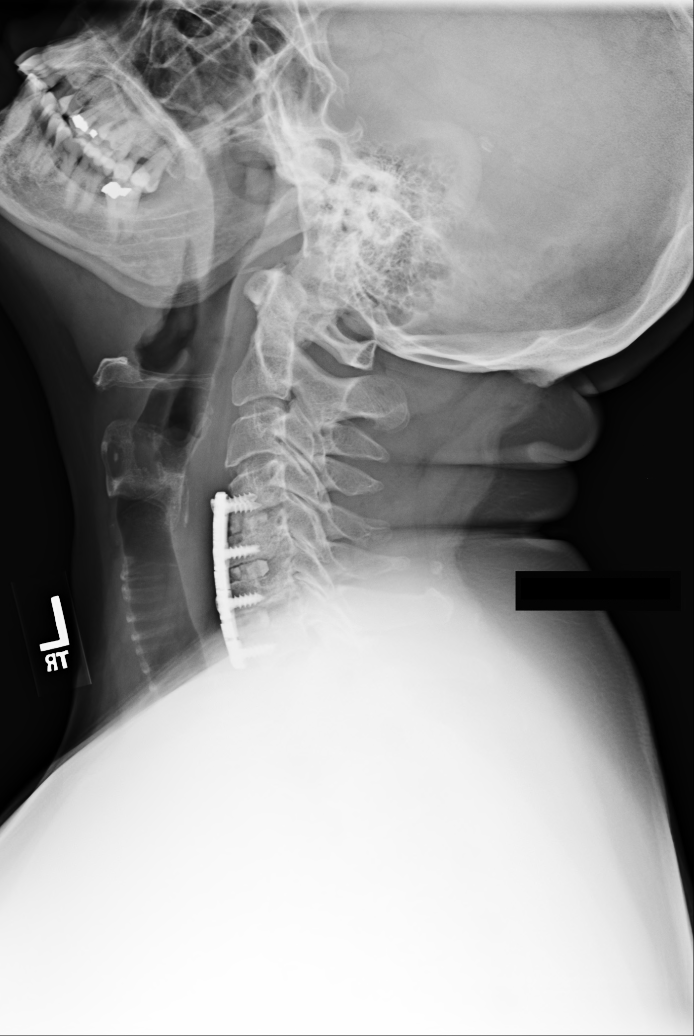
[im 4/6]
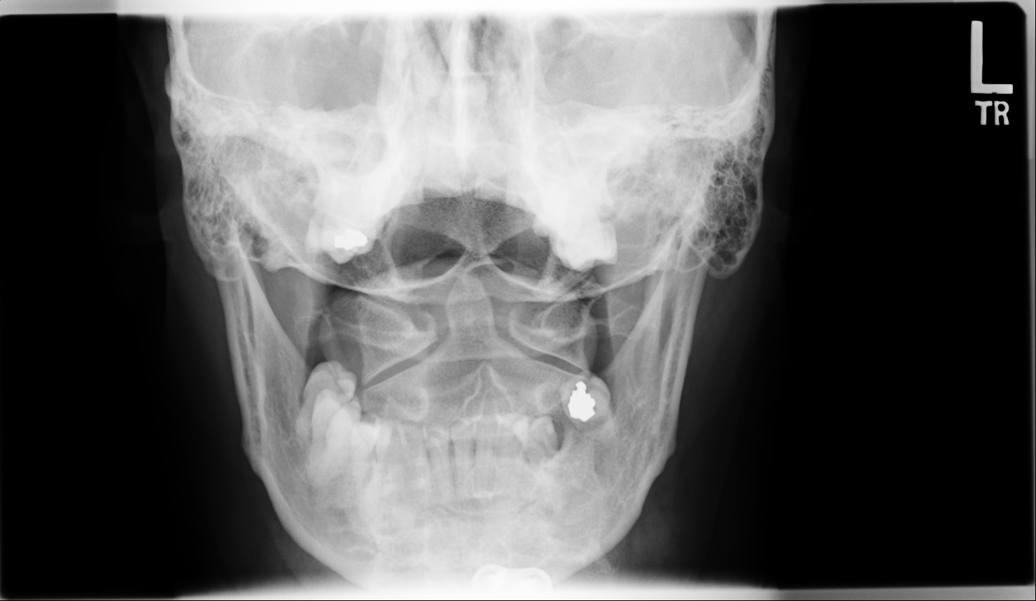
[im 5/6]
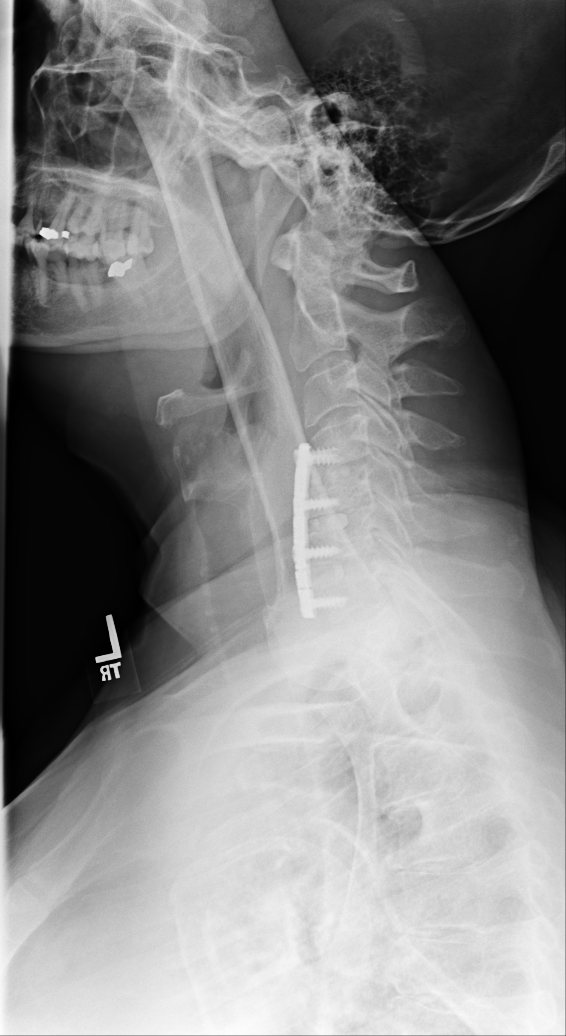
[im 6/6]
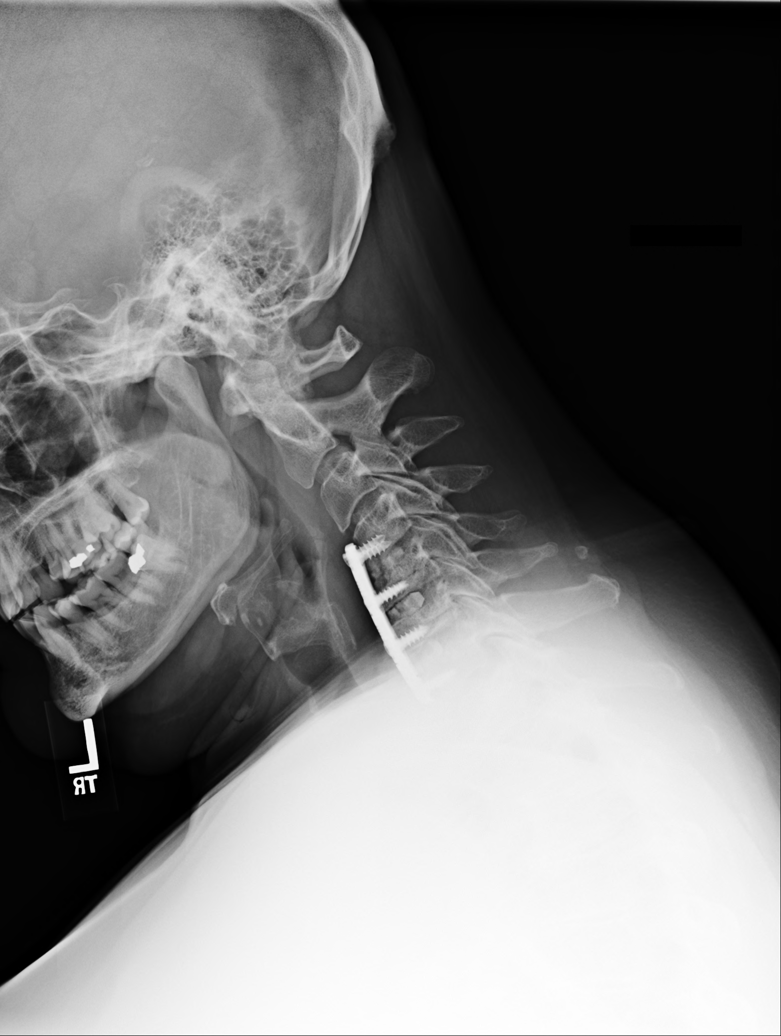

[6 of 6 positions shown; findings below may reference images not displayed]

FINDINGS: Status post C4-C7 ACDF. No perihardware lucency. Anteriorly positioned disc hardware at all levels. Mild multilevel facet arthrosis. Prevertebral soft tissues are unremarkable.
IMPRESSION: 1.
Intact C4-C7 ACDF hardware.

2.
Mild multilevel facet arthrosis.

## 2021-07-18 IMAGING — MR MRI CSPINE WO CONTRAST
7 series · 48 of 48 positions shown · IV contrast (agent unspecified)
Comparison: Cervical spine radiographs 02/16/2021, MRI cervical spine without contrast 10/04/2020

HISTORY: 55-year-old Female with cervical radiculopathy
TECHNIQUE: Multiplanar, multisequence MR images of the cervical spine were obtained without intravenous contrast. 

CONTRAST: None.

[Series 2: bSSFP · axial · 6.0mm · 1.17mm/px · z∈[-46,+149]mm · 9 of 22 slices shown]
[im 1/22]
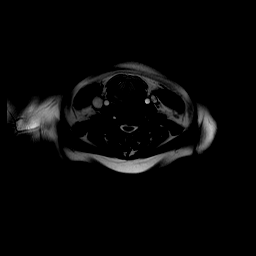
[im 3/22]
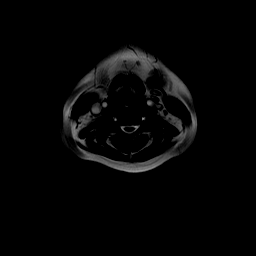
[im 6/22]
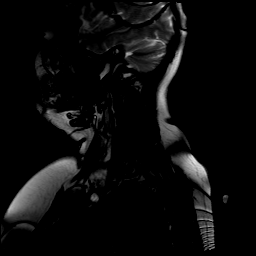
[im 8/22]
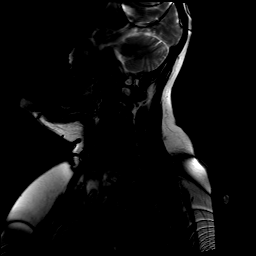
[im 11/22]
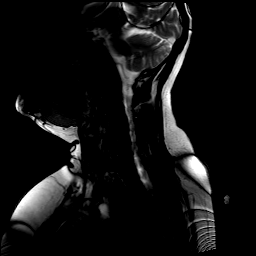
[im 14/22]
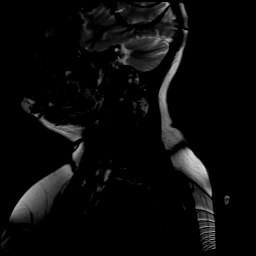
[im 16/22]
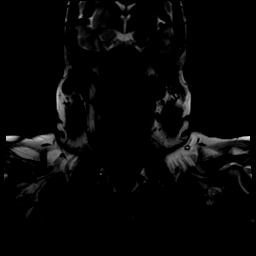
[im 19/22]
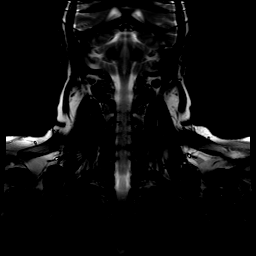
[im 22/22]
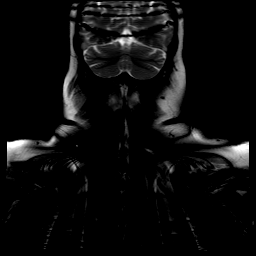

[Series 3: t2_cor_hbw · coronal · 3.0mm · 0.86mm/px · 6 of 16 slices shown]
[im 1/16]
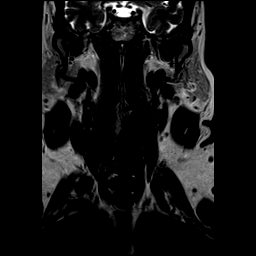
[im 4/16]
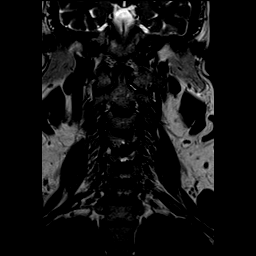
[im 7/16]
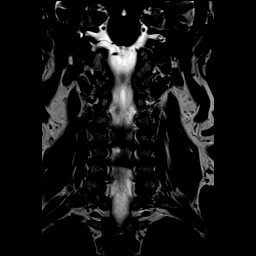
[im 10/16]
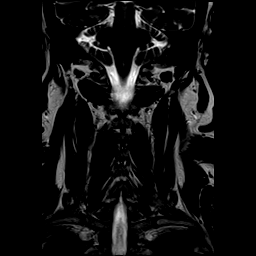
[im 13/16]
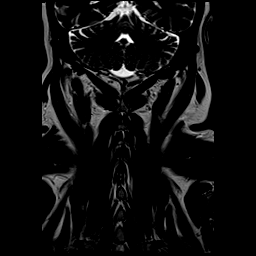
[im 16/16]
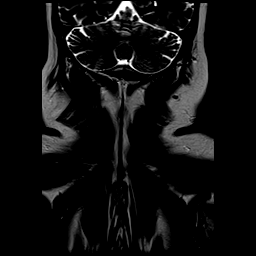

[Series 4: t2_sag_hbw · sagittal · 3.0mm · 0.86mm/px · 6 of 16 slices shown]
[im 1/16]
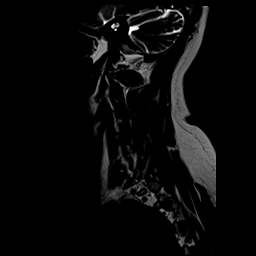
[im 4/16]
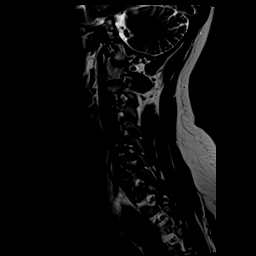
[im 7/16]
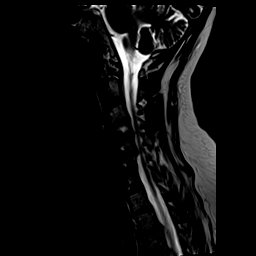
[im 10/16]
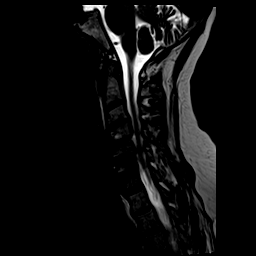
[im 13/16]
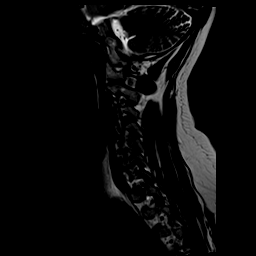
[im 16/16]
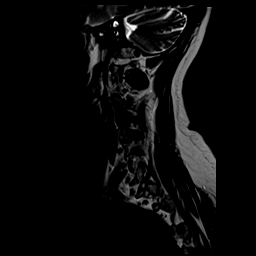

[Series 5: t1_sag_hbw · sagittal · 3.0mm · 0.69mm/px · 6 of 16 slices shown]
[im 1/16]
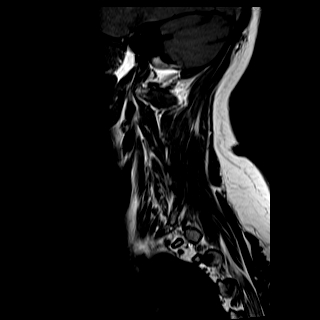
[im 4/16]
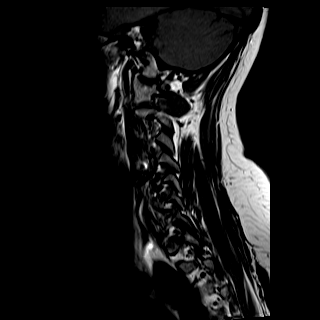
[im 7/16]
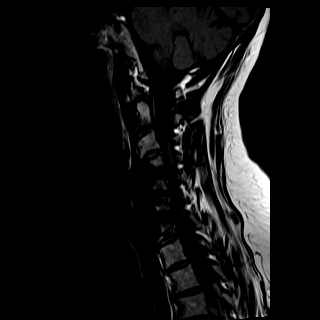
[im 10/16]
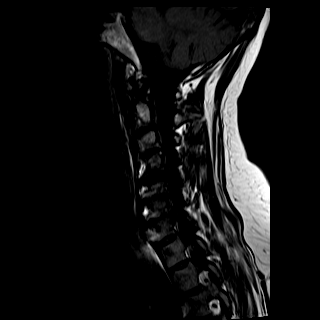
[im 13/16]
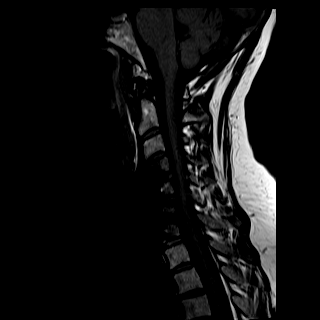
[im 16/16]
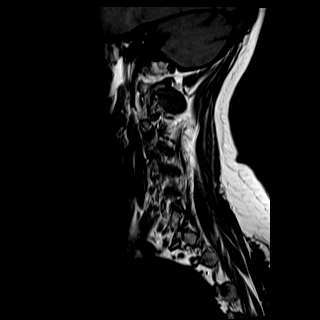

[Series 6: ir_sag_hbw_ · sagittal · 3.0mm · 0.86mm/px · 6 of 16 slices shown]
[im 1/16]
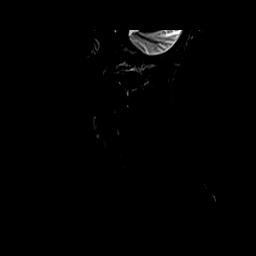
[im 4/16]
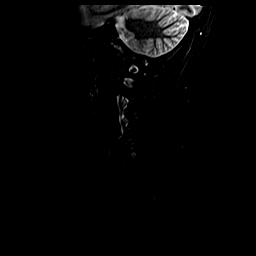
[im 7/16]
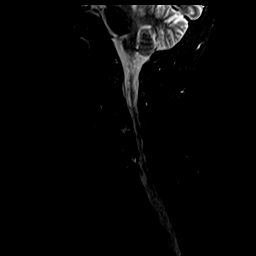
[im 10/16]
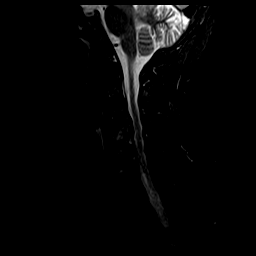
[im 13/16]
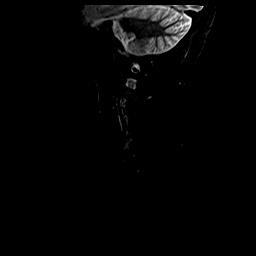
[im 16/16]
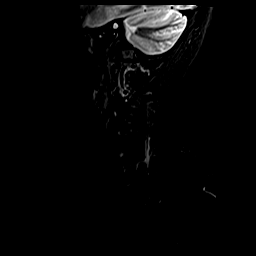

[Series 7: t2_axial_hbw · axial · 3.0mm · 0.56mm/px · z∈[-80,+11]mm · 9 of 25 slices shown]
[im 1/25]
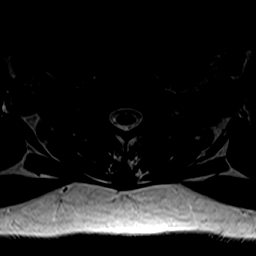
[im 4/25]
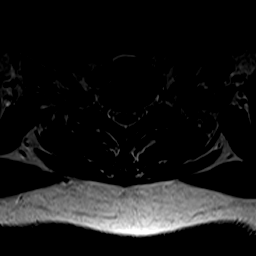
[im 7/25]
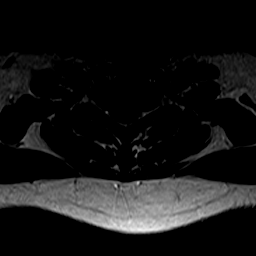
[im 10/25]
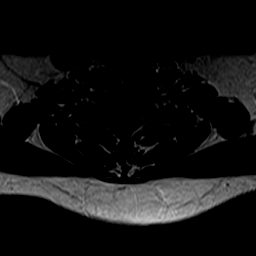
[im 13/25]
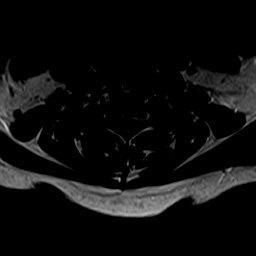
[im 16/25]
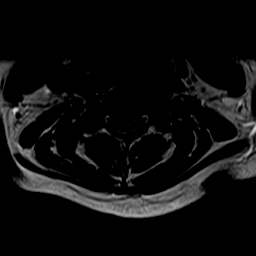
[im 19/25]
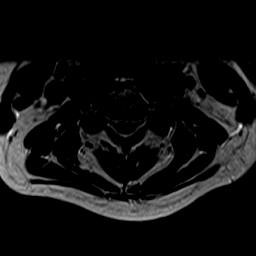
[im 22/25]
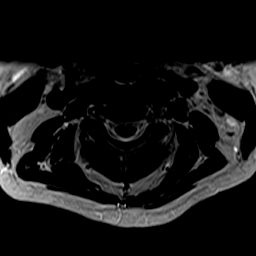
[im 25/25]
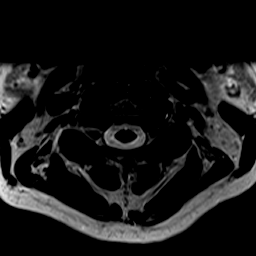

[Series 8: t2_sag_hbw_r1 · sagittal · 3.0mm · 0.86mm/px · 6 of 16 slices shown]
[im 1/16]
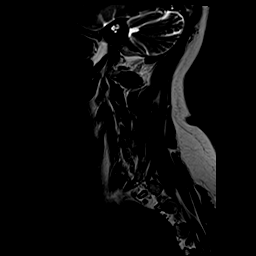
[im 4/16]
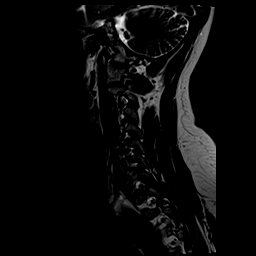
[im 7/16]
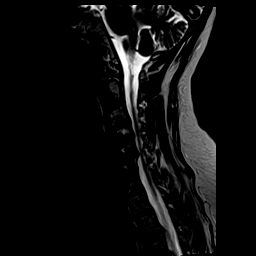
[im 10/16]
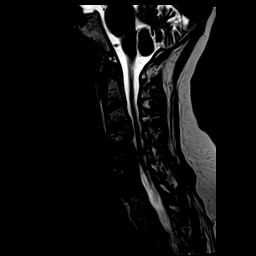
[im 13/16]
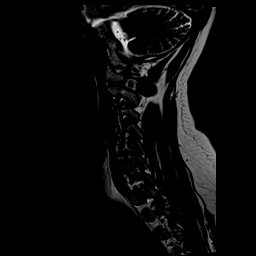
[im 16/16]
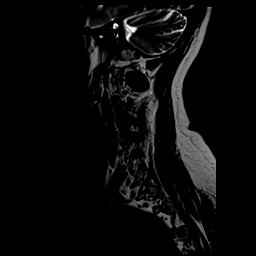

[48 of 48 positions shown; findings below may reference images not displayed]

FINDINGS: ALIGNMENT: Grade 1 retrolisthesis of C5 over C6 versus prominent thickened posterior longitudinal ligament.

VERTEBRAE:  Previous ACDF at C4-7.Mild multilevel degenerative endplate changes.

INTERVERTEBRAL DISCS:  Multilevel disc desiccation without height loss.

PARASPINAL SOFT TISSUES:  No significant paraspinal soft tissue signal abnormalities.

SPINAL CORD:  There is mass effect on the spinal cord at C5-6 with mild abnormal spinal cord signal in the left aspect of the cord at C6.

VISUALIZED POSTERIOR FOSSA: Unremarkable.

Findings by level:

C2-C3:  No high-grade canal stenosis or foraminal narrowing.

C3-C4:  No high-grade canal stenosis or foraminal narrowing.

C4-C5:  Uncovertebral and facet joint hypertrophy contribute to moderate bilateral foraminal narrowing, similar to prior. Disc osteophyte complex and ligamentum flavum hypertrophy cause mild to moderate canal stenosis, improved from prior.

C5-C6:  Spondylolisthesis, disc osteophyte complex, ligamentum flavum hypertrophy and uncovertebral and facet joint hypertrophy. Moderate canal stenosis, improved from prior. Severe bilateral foraminal narrowing, similar to prior.

C6-C7:  Uncovertebral and facet joint hypertrophy. Severe bilateral foraminal narrowing. No high-grade canal stenosis.

C7-T1:  No high-grade canal stenosis or foraminal narrowing.
IMPRESSION: 1.
Moderate bilateral C4-5 foraminal narrowing, similar to prior.

2.
Mild to moderate C4-5 canal stenosis, improved from prior.

3.
Moderate C5-6 canal stenosis, improved from prior.

4.
Severe bilateral C5-6 foraminal narrowing, similar to prior.

5.
Severe bilateral C6-7 foraminal narrowing, similar to prior.

## 2022-10-13 IMAGING — MG MAMMO DIAG BIL W/CAD TOMO
8 series · 8 of 24 positions shown · non-contrast
Comparison: The present examination has been compared to prior imaging studies.

Images Obtained from [facility]
HISTORY: Patient is 56 years old and is seen for diagnostic evaluation of a family history of breast cancer. The patient has a history of right ultrasound guided core biopsy in 07/20/2020 - fibroadenoma. The
patient has no personal history of cancer. The patient has the following family history of breast cancer:  maternal grandmother, at age 57, breast cancer.
TECHNIQUE: Bilateral 2-D digital diagnostic mammogram was performed followed by 3-D tomosynthesis.  Current study was also evaluated with a computer aided detection (CAD) system.
MAMMOGRAM FINDINGS:
The breasts are almost entirely fatty.
There is a biopsy marker clip in the right breast.
No suspicious abnormality is seen in either breast.

[L CC]
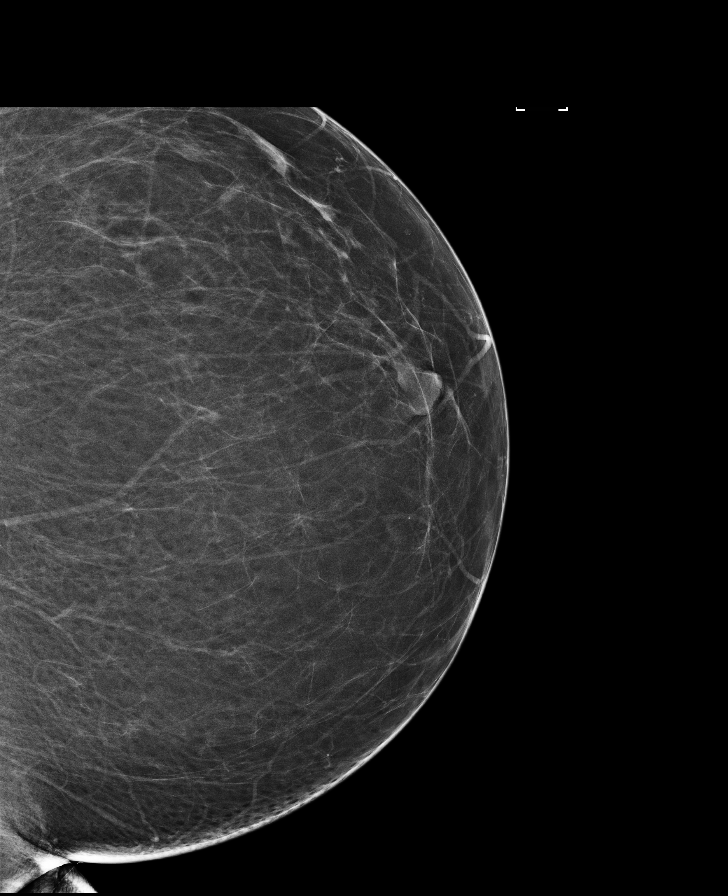

[R MLO]
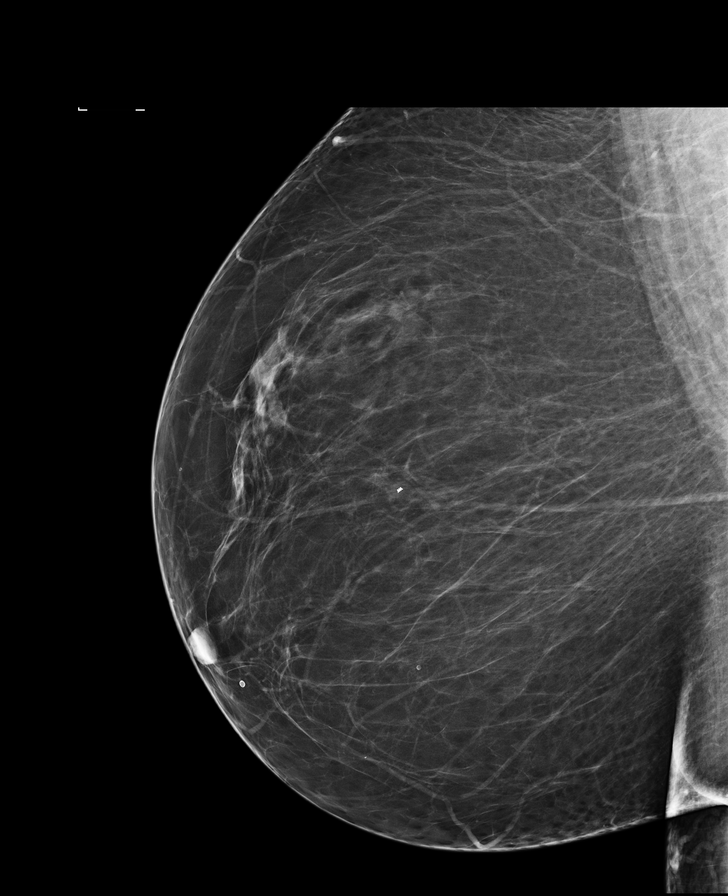

[L MLO]
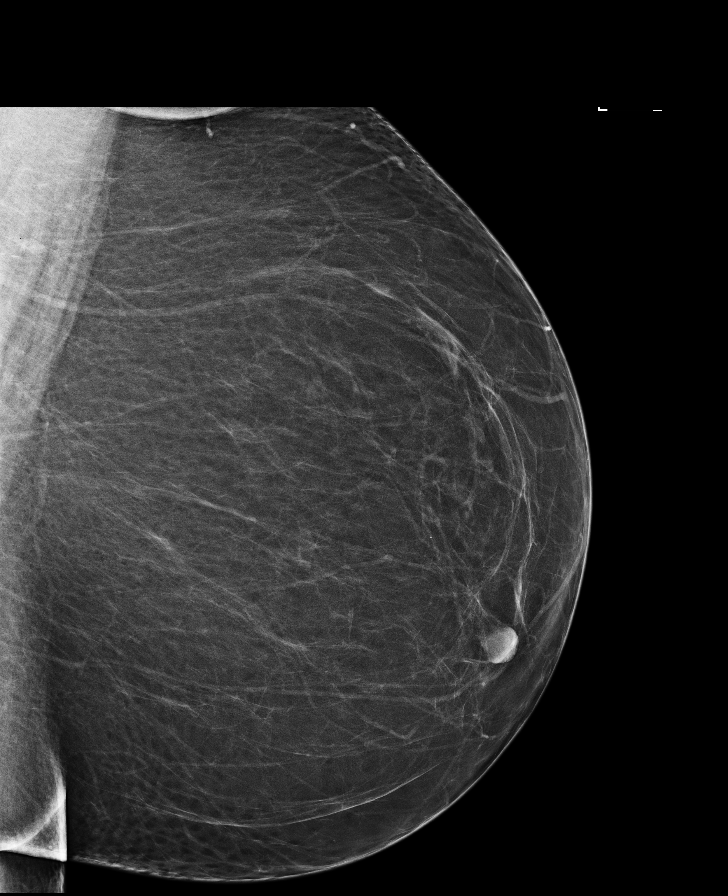

[R CC]
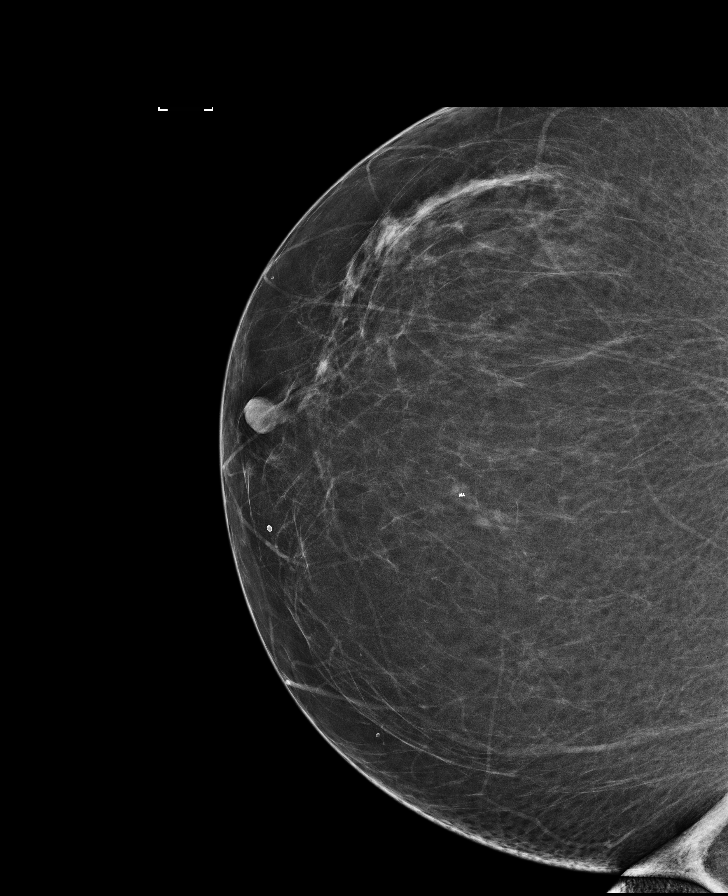

[R MLO tomo · tomo slice 51/100.0]
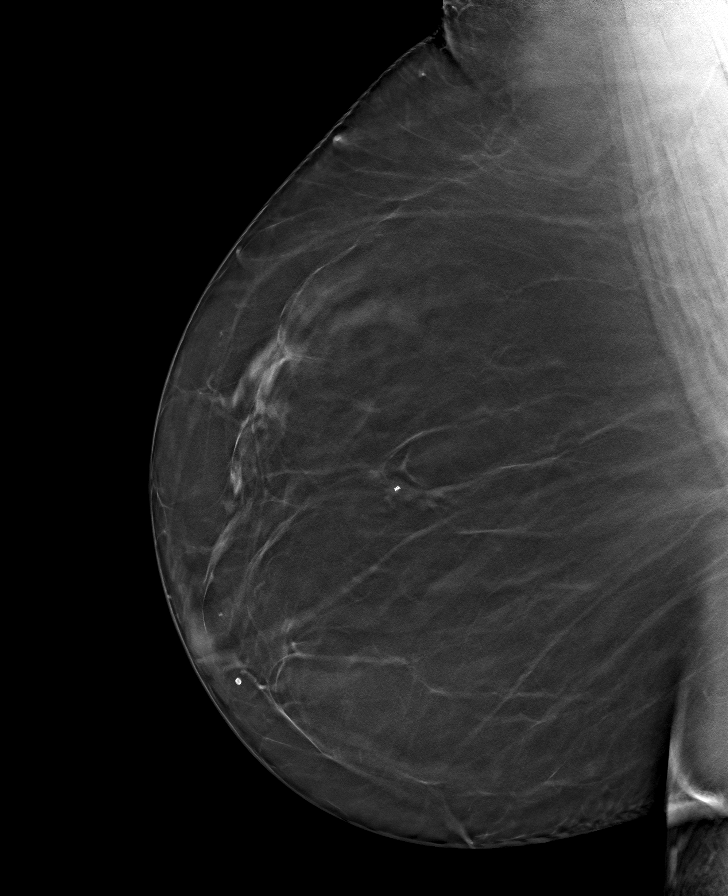

[L CC tomo · tomo slice 47/93.0]
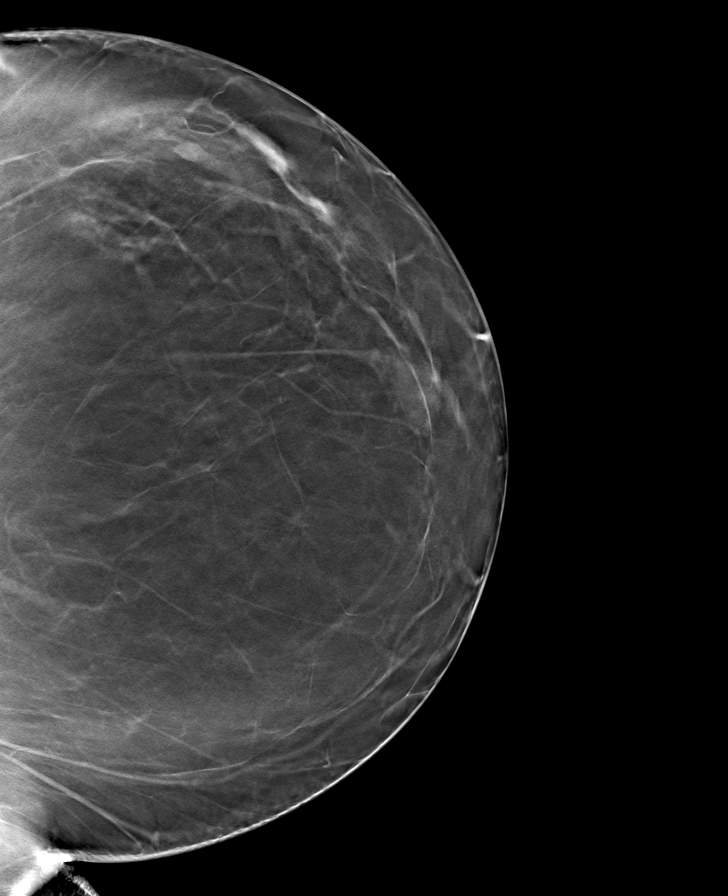

[L MLO tomo · tomo slice 53/104.0]
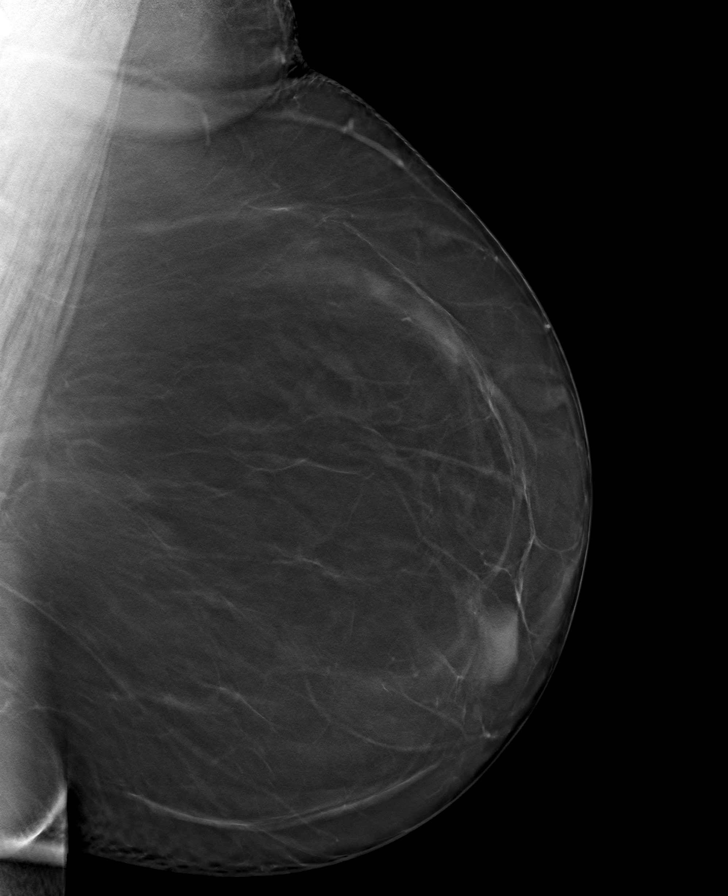

[R CC tomo · tomo slice 47/94.0]
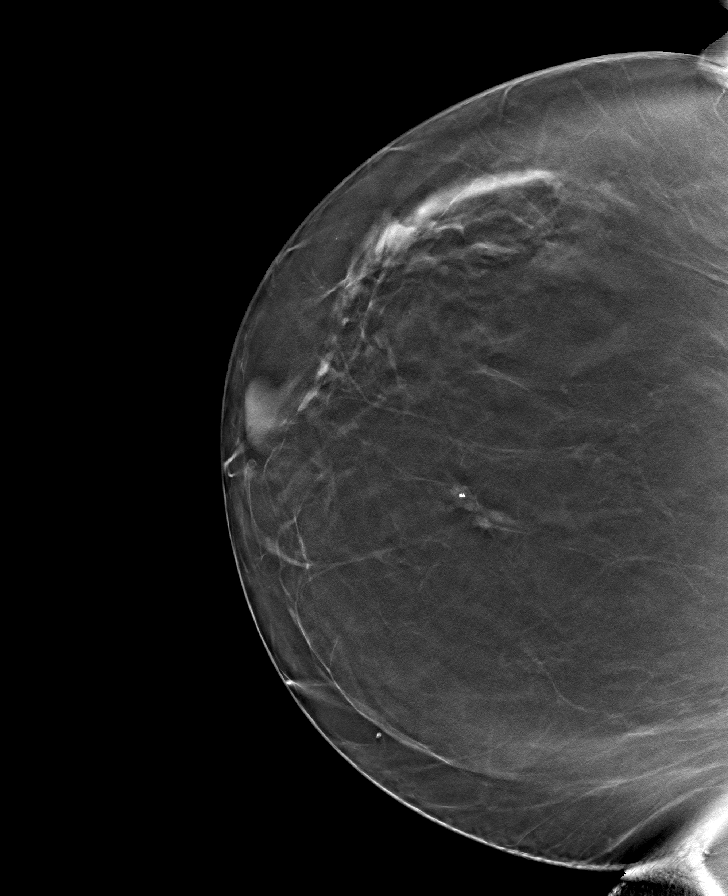

[8 of 24 positions shown; findings below may reference images not displayed]

IMPRESSION: There is no mammographic evidence of malignancy.
A return to screening in 1 year is recommended.
Findings were addressed and documented. The patient was given a copy of her results at the time of her visit.
BI-RADS Category 2: Benign
